# Patient Record
Sex: Female | Born: 1965 | Race: Black or African American | Hispanic: No | Marital: Single | Smoking: Former smoker
Health system: Southern US, Community
[De-identification: ages and names within clinical notes are randomized; demographics above are authoritative.]

## PROBLEM LIST (undated history)

## (undated) DIAGNOSIS — K635 Polyp of colon: Secondary | ICD-10-CM

## (undated) DIAGNOSIS — D126 Benign neoplasm of colon, unspecified: Secondary | ICD-10-CM

## (undated) DIAGNOSIS — K5792 Diverticulitis of intestine, part unspecified, without perforation or abscess without bleeding: Secondary | ICD-10-CM

## (undated) DIAGNOSIS — I493 Ventricular premature depolarization: Secondary | ICD-10-CM

## (undated) DIAGNOSIS — H8110 Benign paroxysmal vertigo, unspecified ear: Secondary | ICD-10-CM

## (undated) DIAGNOSIS — H9209 Otalgia, unspecified ear: Secondary | ICD-10-CM

## (undated) DIAGNOSIS — K648 Other hemorrhoids: Secondary | ICD-10-CM

## (undated) DIAGNOSIS — K219 Gastro-esophageal reflux disease without esophagitis: Secondary | ICD-10-CM

## (undated) DIAGNOSIS — D649 Anemia, unspecified: Secondary | ICD-10-CM

## (undated) DIAGNOSIS — I10 Essential (primary) hypertension: Secondary | ICD-10-CM

## (undated) DIAGNOSIS — K5790 Diverticulosis of intestine, part unspecified, without perforation or abscess without bleeding: Secondary | ICD-10-CM

## (undated) HISTORY — DX: Benign neoplasm of colon, unspecified: D12.6

## (undated) HISTORY — PX: COLONOSCOPY: SHX174

## (undated) HISTORY — PX: CERVICAL BIOPSY  W/ LOOP ELECTRODE EXCISION: SUR135

## (undated) HISTORY — PX: UMBILICAL HERNIA REPAIR: SHX196

## (undated) HISTORY — DX: Ventricular premature depolarization: I49.3

## (undated) HISTORY — DX: Diverticulitis of intestine, part unspecified, without perforation or abscess without bleeding: K57.92

## (undated) HISTORY — DX: Benign paroxysmal vertigo, unspecified ear: H81.10

## (undated) HISTORY — PX: CHOLECYSTECTOMY: SHX55

## (undated) HISTORY — PX: TONSILLECTOMY: SUR1361

## (undated) HISTORY — PX: BREAST BIOPSY: SHX20

## (undated) HISTORY — DX: Essential (primary) hypertension: I10

## (undated) HISTORY — DX: Other hemorrhoids: K64.8

## (undated) HISTORY — DX: Polyp of colon: K63.5

---

## 1997-11-16 ENCOUNTER — Other Ambulatory Visit: Admission: RE | Admit: 1997-11-16 | Discharge: 1997-11-16 | Payer: Self-pay | Admitting: *Deleted

## 2003-08-05 ENCOUNTER — Emergency Department (HOSPITAL_COMMUNITY): Admission: EM | Admit: 2003-08-05 | Discharge: 2003-08-05 | Payer: Self-pay | Admitting: Family Medicine

## 2005-03-29 ENCOUNTER — Emergency Department (HOSPITAL_COMMUNITY): Admission: EM | Admit: 2005-03-29 | Discharge: 2005-03-29 | Payer: Self-pay | Admitting: Emergency Medicine

## 2006-08-09 ENCOUNTER — Emergency Department (HOSPITAL_COMMUNITY): Admission: EM | Admit: 2006-08-09 | Discharge: 2006-08-09 | Payer: Self-pay | Admitting: Family Medicine

## 2007-07-31 ENCOUNTER — Encounter: Admission: RE | Admit: 2007-07-31 | Discharge: 2007-07-31 | Payer: Self-pay | Admitting: Obstetrics & Gynecology

## 2007-07-31 ENCOUNTER — Encounter (INDEPENDENT_AMBULATORY_CARE_PROVIDER_SITE_OTHER): Payer: Self-pay | Admitting: Diagnostic Radiology

## 2008-10-15 ENCOUNTER — Emergency Department (HOSPITAL_COMMUNITY): Admission: EM | Admit: 2008-10-15 | Discharge: 2008-10-15 | Payer: Self-pay | Admitting: Emergency Medicine

## 2009-04-18 ENCOUNTER — Emergency Department (HOSPITAL_COMMUNITY): Admission: EM | Admit: 2009-04-18 | Discharge: 2009-04-18 | Payer: Self-pay | Admitting: Emergency Medicine

## 2010-05-08 ENCOUNTER — Encounter: Admission: RE | Admit: 2010-05-08 | Discharge: 2010-05-08 | Payer: Self-pay | Admitting: Obstetrics and Gynecology

## 2010-07-09 HISTORY — PX: VAGINAL HYSTERECTOMY: SUR661

## 2010-07-24 LAB — BASIC METABOLIC PANEL
BUN: 14 mg/dL (ref 6–23)
CO2: 24 mEq/L (ref 19–32)
Calcium: 9.1 mg/dL (ref 8.4–10.5)
Chloride: 108 mEq/L (ref 96–112)
Creatinine, Ser: 0.64 mg/dL (ref 0.4–1.2)
GFR calc Af Amer: >60 mL/min (ref >60)
GFR calc non Af Amer: >60 mL/min (ref >60)
Glucose, Bld: 73 mg/dL (ref 70–99)
Potassium: 3.8 mEq/L (ref 3.5–5.1)
Sodium: 137 mEq/L (ref 135–145)

## 2010-07-24 LAB — MRSA CULTURE

## 2010-07-24 LAB — CBC
HCT: 29.9 % — ABNORMAL LOW (ref 36.0–46.0)
Hemoglobin: 9.4 g/dL — ABNORMAL LOW (ref 12.0–15.0)
MCH: 25.7 pg — ABNORMAL LOW (ref 26.0–34.0)
MCHC: 31.4 g/dL (ref 30.0–36.0)
MCV: 81.7 fL (ref 78.0–100.0)
Platelets: 349 10*3/uL (ref 150–400)
RBC: 3.66 MIL/uL — ABNORMAL LOW (ref 3.87–5.11)
RDW: 16.5 % — ABNORMAL HIGH (ref 11.5–15.5)
WBC: 7.9 10*3/uL (ref 4.0–10.5)

## 2010-07-24 LAB — SURGICAL PCR SCREEN: Staphylococcus aureus: INVALID — AB

## 2010-07-25 ENCOUNTER — Ambulatory Visit (HOSPITAL_COMMUNITY)
Admission: RE | Admit: 2010-07-25 | Discharge: 2010-07-26 | Payer: Self-pay | Source: Home / Self Care | Attending: Obstetrics and Gynecology | Admitting: Obstetrics and Gynecology

## 2010-07-25 ENCOUNTER — Encounter (INDEPENDENT_AMBULATORY_CARE_PROVIDER_SITE_OTHER): Payer: Self-pay | Admitting: Obstetrics and Gynecology

## 2010-07-26 LAB — PREGNANCY, URINE: Preg Test, Ur: NEGATIVE

## 2010-07-31 LAB — CBC
HCT: 26 % — ABNORMAL LOW (ref 36.0–46.0)
Hemoglobin: 8.3 g/dL — ABNORMAL LOW (ref 12.0–15.0)
MCH: 25.9 pg — ABNORMAL LOW (ref 26.0–34.0)
MCHC: 31.9 g/dL (ref 30.0–36.0)
MCV: 81.3 fL (ref 78.0–100.0)
Platelets: 248 10*3/uL (ref 150–400)
RBC: 3.2 MIL/uL — ABNORMAL LOW (ref 3.87–5.11)
RDW: 15.8 % — ABNORMAL HIGH (ref 11.5–15.5)
WBC: 13.7 10*3/uL — ABNORMAL HIGH (ref 4.0–10.5)

## 2010-07-31 LAB — BASIC METABOLIC PANEL
BUN: 10 mg/dL (ref 6–23)
CO2: 27 mEq/L (ref 19–32)
Calcium: 9.2 mg/dL (ref 8.4–10.5)
Chloride: 106 mEq/L (ref 96–112)
Creatinine, Ser: 0.6 mg/dL (ref 0.4–1.2)
GFR calc Af Amer: >60 mL/min (ref >60)
GFR calc non Af Amer: >60 mL/min (ref >60)
Glucose, Bld: 99 mg/dL (ref 70–99)
Potassium: 3.9 mEq/L (ref 3.5–5.1)
Sodium: 137 mEq/L (ref 135–145)

## 2010-08-24 NOTE — Op Note (Signed)
  [name], [name]             ACCOUNT NO.:  [id number]  MEDICAL RECORD NO.:  [id number]          PATIENT TYPE:  OIB  LOCATION:  9313                          FACILITY:  [facility]  PHYSICIAN:  Sherron Monday, MD        DATE OF BIRTH:  1966-01-09  DATE OF PROCEDURE:  07/25/2010 DATE OF DISCHARGE:                              OPERATIVE REPORT   PREOPERATIVE DIAGNOSES: 1. Menorrhagia. 2. Anemia. 3. Dysmenorrhea.  POSTOPERATIVE DIAGNOSES: 1. Menorrhagia. 2. Anemia. 3. Dysmenorrhea.  PROCEDURES: 1. Total vaginal hysterectomy. 2. Cystoscopy.  SURGEON:  Sherron Monday, MD  ASSISTANT:  Zenaida Niece, MD  ANESTHESIA:  General.  FINDINGS:  A 6-week size uterus, normal bilateral ovaries and bilateral tubes.  SPECIMEN:  Uterus and cervix to Pathology.  ESTIMATED BLOOD LOSS:  100 mL.  IV FLUIDS:  1100 mL.  URINE OUTPUT:  75 mL, clear at the end of the case.  COMPLICATIONS:  None.  DISPOSITION:  Stable to PACU.  DESCRIPTION OF PROCEDURE:  After informed consent was reviewed with the patient including risks, benefits, and alternatives of the surgical procedure, she was transported to the OR, placed on the table in supine position.  General anesthesia was induced and found to be adequate.  She was then placed in the Yellofin stirrups, prepped and draped in the normal sterile fashion.  A Foley was sterilely placed.  Using a heavy weighted speculum and a Sims retractor, her cervix was easily visualized, grasped with 2 Christella Hartigan tenaculae and infiltrated with Pitressin 20-50.  The cervix was then circumscribed using Bovie cautery. Entry was made posteriorly and the entry into the peritoneum was extended.  The banana speculum was then placed.  The uterosacral ligaments were ligated and held in stepwise fashion.  The cardinal ligaments through the uterine artery and were ligated bilaterally. Entry was made anteriorly with good visualization.  The IP ligament was doubly ligated with  0 Vicryl.  The uterus was removed in its entirety. The pedicles were all inspected closely and noted to be hemostatic.  The uterosacral ligaments were plicated with 0 Vicryl.  The pedicles were then tied together.  The cuff was closed with 2-0 Vicryl in a running locked fashion.  The Foley was then removed.  The cystoscope was placed in to the bladder where 200 mL was placed after indigo carmine having being given to the patient.  Jet was easily visualized on the left after waiting.  A jet was visualized on the right as well.  The cystoscope was removed.  Foley was replaced.  Sponge, lap, and needle counts were correct x2 per the operating room staff.  The patient tolerated the procedure well.     Sherron Monday, MD     JB/MEDQ  D:  07/25/2010  T:  07/26/2010  Job:  [id number]  Electronically Signed by Sherron Monday MD on 08/24/2010 05:13:22 PM

## 2010-08-24 NOTE — H&P (Signed)
  [name], [name]             ACCOUNT NO.:  [id number]  MEDICAL RECORD NO.:  [id number]           PATIENT TYPE:  LOCATION:                                 FACILITY:  PHYSICIAN:  Sherron Monday, MD        DATE OF BIRTH:  [birth year]  DATE OF ADMISSION:  07/25/2010 DATE OF DISCHARGE:                             HISTORY & PHYSICAL   ADMITTING DIAGNOSIS:  Menorrhagia.  PROCEDURE PLANNED:  Total vaginal hysterectomy.  HISTORY OF PRESENT ILLNESS:  A 45 year old G3, P2-0-1-2 who presents with menorrhagia and irregular menses for a evaluation.  An endometrial biopsy was benign.  The patient states that she cannot tolerate the heavy bleeding anymore.  She also has a history of an abnormal Pap smear, has had he tubes tied for contraception and is not willing to try hormonal control of her cycle.  I discussed with the patient risks, benefits and alternatives of the surgical procedure.  She voices understanding to all of this and wishes to proceed.  PAST MEDICAL HISTORY:  Not significant.  PAST SURGICAL HISTORY:  Significant for: 1. A tubal ligation. 2. Breast biopsy. 3. D and C. 4. Hernia repair. 5. Tonsillectomy.  PAST OB/GYN HISTORY:  G3 P2-0-1-2.  She has a history of an abnormal Pap smear and I believe they have been normal since. She has had her tubes tied for contraception.  She has complained of 15-60 days cycles for quite some time.  She states they are unmanageable.  MEDICATIONS:  Include multivitamins.  ALLERGIES:  NO KNOWN DRUG ALLERGIES.  SOCIAL HISTORY:  Denies tobacco or drug use.  She is a smoker. Occasional alcohol use.  No other illicit drug use.  She is divorced, has no history of any sexually transmitted diseases.  FAMILY HISTORY:  Significant for breast cancer, diabetes type 2 and hypertension.  PHYSICAL EXAMINATION:  GENERAL:  No apparent distress. CARDIOVASCULAR:  Regular rate and rhythm. LUNGS:  Clear to auscultation bilaterally. ABDOMEN:  Soft,  nontender, nondistended.  Positive bowel sounds. EXTREMITIES:  Symmetric and nontender.  Her hemoglobin, she was mildly anemic at 9.6 the last time it was checked.  She had an ultrasound to evaluate her ovaries more fully, which showed small fibroids.  ASSESSMENT AND PLAN:  A 45 year old G3, P2 0-1-2 for a total vaginal hysterectomy.  After the discussion of risks, benefits and alternatives, she wishes to proceed.  We did discuss bleeding, infection, damage to surrounding organs as well as trouble healing.  She voiced understanding.  Her ovaries will be maintained after a discussion of surgical menopause.  She voices understanding to all this and wishes to proceed.     Sherron Monday, MD     JB/MEDQ  D:  07/24/2010  T:  07/24/2010  Job:  [id number]  Electronically Signed by Sherron Monday MD on 08/24/2010 05:12:56 PM

## 2010-08-24 NOTE — Discharge Summary (Signed)
  [name], [name]             ACCOUNT NO.:  [id number]  MEDICAL RECORD NO.:  [id number]          PATIENT TYPE:  OIB  LOCATION:  9313                          FACILITY:  [facility]  PHYSICIAN:  Sherron Monday, MD        DATE OF BIRTH:  06/18/1966  DATE OF ADMISSION:  07/25/2010 DATE OF DISCHARGE:  07/26/2010                              DISCHARGE SUMMARY   ADMITTING DIAGNOSES:  Menorrhagia to anemia as well as dysmenorrhea  PROCEDURE:  Total vaginal hysterectomy.  HISTORY:  For complete a history, please see the dictated note. However, in brief, a 45 year old G3, P 2-0-1-2 who presents with menorrhagia, dysmenorrhea and irregular bleeding for evaluation and after evaluation decided to proceed with definitive management of the total vaginal hysterectomy.  This was performed on the 17th without complications.  She had this as well as cystoscopy revealing 6-week size uterus, normal ovaries bilaterally and normal tubes.  Her uterus and cervix were sent to Pathology.  EBL was approximately 800 mL.  On postoperative day #1, her pain was well controlled.  She was tolerating diet, voiding and ambulating without difficulty.  She had remained afebrile.  Vital signs stable with great urine output since her procedure.  Hemoglobin decreased from 9.4 to 8.3 and creatinine was stable at 6.46.  She was discharged to home with routine discharge instructions and numbers to call with any questions or problems as well as prescriptions for Motrin and Percocet.  She was given instructions to call and follow up in the office in approximately 2-3 weeks.  She voiced understanding to all of this and was discharged.     Sherron Monday, MD     JB/MEDQ  D:  07/26/2010  T:  07/26/2010  Job:  [id number]  Electronically Signed by Sherron Monday MD on 08/24/2010 05:12:29 PM

## 2010-11-21 ENCOUNTER — Inpatient Hospital Stay (INDEPENDENT_AMBULATORY_CARE_PROVIDER_SITE_OTHER)
Admission: RE | Admit: 2010-11-21 | Discharge: 2010-11-21 | Disposition: A | Discharge status: Home / Self Care | Payer: 59 | Source: Ambulatory Visit | Attending: Family Medicine | Admitting: Family Medicine

## 2010-11-21 DIAGNOSIS — J029 Acute pharyngitis, unspecified: Secondary | ICD-10-CM

## 2010-11-21 LAB — POCT RAPID STREP A: Streptococcus, Group A Screen (Direct): NEGATIVE

## 2011-05-07 DIAGNOSIS — H8110 Benign paroxysmal vertigo, unspecified ear: Secondary | ICD-10-CM

## 2011-05-07 DIAGNOSIS — Z8679 Personal history of other diseases of the circulatory system: Secondary | ICD-10-CM | POA: Insufficient documentation

## 2011-05-07 DIAGNOSIS — I493 Ventricular premature depolarization: Secondary | ICD-10-CM | POA: Insufficient documentation

## 2011-05-07 DIAGNOSIS — I10 Essential (primary) hypertension: Secondary | ICD-10-CM

## 2011-05-07 HISTORY — DX: Ventricular premature depolarization: I49.3

## 2011-05-07 HISTORY — DX: Essential (primary) hypertension: I10

## 2011-05-07 HISTORY — DX: Personal history of other diseases of the circulatory system: Z86.79

## 2011-05-07 HISTORY — DX: Benign paroxysmal vertigo, unspecified ear: H81.10

## 2011-05-08 ENCOUNTER — Emergency Department (HOSPITAL_COMMUNITY): Payer: 59

## 2011-05-08 ENCOUNTER — Encounter: Payer: Self-pay | Admitting: Internal Medicine

## 2011-05-08 ENCOUNTER — Observation Stay (HOSPITAL_COMMUNITY)
Admission: EM | Admit: 2011-05-08 | Discharge: 2011-05-10 | Disposition: A | Discharge status: Home / Self Care | Payer: 59 | Attending: Internal Medicine | Admitting: Internal Medicine

## 2011-05-08 DIAGNOSIS — I10 Essential (primary) hypertension: Secondary | ICD-10-CM | POA: Insufficient documentation

## 2011-05-08 DIAGNOSIS — R002 Palpitations: Secondary | ICD-10-CM

## 2011-05-08 DIAGNOSIS — H8110 Benign paroxysmal vertigo, unspecified ear: Principal | ICD-10-CM | POA: Insufficient documentation

## 2011-05-08 DIAGNOSIS — R42 Dizziness and giddiness: Secondary | ICD-10-CM

## 2011-05-08 DIAGNOSIS — R4701 Aphasia: Secondary | ICD-10-CM | POA: Insufficient documentation

## 2011-05-08 DIAGNOSIS — I519 Heart disease, unspecified: Secondary | ICD-10-CM | POA: Insufficient documentation

## 2011-05-08 LAB — DIFFERENTIAL
Basophils Absolute: 0 10*3/uL (ref 0.0–0.1)
Basophils Relative: 0 % (ref 0–1)
Eosinophils Absolute: 0.1 10*3/uL (ref 0.0–0.7)
Eosinophils Relative: 2 % (ref 0–5)
Lymphocytes Relative: 41 % (ref 12–46)
Lymphs Abs: 3.5 10*3/uL (ref 0.7–4.0)
Monocytes Absolute: 0.5 10*3/uL (ref 0.1–1.0)
Monocytes Relative: 6 % (ref 3–12)
Neutro Abs: 4.5 10*3/uL (ref 1.7–7.7)
Neutrophils Relative %: 52 % (ref 43–77)

## 2011-05-08 LAB — CBC
HCT: 36.3 % (ref 36.0–46.0)
Hemoglobin: 12.2 g/dL (ref 12.0–15.0)
MCH: 31.4 pg (ref 26.0–34.0)
MCHC: 33.6 g/dL (ref 30.0–36.0)
MCV: 93.3 fL (ref 78.0–100.0)
Platelets: 257 10*3/uL (ref 150–400)
RBC: 3.89 MIL/uL (ref 3.87–5.11)
RDW: 12.5 % (ref 11.5–15.5)
WBC: 8.7 10*3/uL (ref 4.0–10.5)

## 2011-05-08 LAB — COMPREHENSIVE METABOLIC PANEL
ALT: 12 U/L (ref 0–35)
AST: 18 U/L (ref 0–37)
Albumin: 3.7 g/dL (ref 3.5–5.2)
Alkaline Phosphatase: 74 U/L (ref 39–117)
BUN: 13 mg/dL (ref 6–23)
CO2: 20 mEq/L (ref 19–32)
Calcium: 10 mg/dL (ref 8.4–10.5)
Chloride: 104 mEq/L (ref 96–112)
Creatinine, Ser: 0.64 mg/dL (ref 0.50–1.10)
GFR calc Af Amer: >90 mL/min (ref >90)
GFR calc non Af Amer: >90 mL/min (ref >90)
Glucose, Bld: 99 mg/dL (ref 70–99)
Potassium: 4 mEq/L (ref 3.5–5.1)
Sodium: 135 mEq/L (ref 135–145)
Total Bilirubin: 0.3 mg/dL (ref 0.3–1.2)
Total Protein: 7.5 g/dL (ref 6.0–8.3)

## 2011-05-08 LAB — HEMOGLOBIN A1C
Hgb A1c MFr Bld: 5.3 % (ref <5.7)
Mean Plasma Glucose: 105 mg/dL (ref <117)

## 2011-05-08 LAB — CK TOTAL AND CKMB (NOT AT ARMC)
CK, MB: 2.2 ng/mL (ref 0.3–4.0)
Relative Index: 2 (ref 0.0–2.5)
Total CK: 111 U/L (ref 7–177)

## 2011-05-08 LAB — TSH: TSH: 0.557 u[IU]/mL (ref 0.350–4.500)

## 2011-05-08 LAB — PROTIME-INR
INR: 0.98 (ref 0.00–1.49)
Prothrombin Time: 13.2 seconds (ref 11.6–15.2)

## 2011-05-08 LAB — TROPONIN I: Troponin I: <0.3 ng/mL (ref <0.30)

## 2011-05-08 LAB — APTT: aPTT: 34 seconds (ref 24–37)

## 2011-05-08 LAB — GLUCOSE, CAPILLARY: Glucose-Capillary: 90 mg/dL (ref 70–99)

## 2011-05-08 NOTE — H&P (Signed)
Hospital Admission Note Date: 05/08/2011  Patient name:  [name]   Medical record number:  [id number] Date of birth:  03-19-1966    Age: 45 y.o. Gender:  female PCP:   No primary provider on file.  Medical Service:  Internal Medicine Teaching Service   Attending physician:  Dr. Cliffton Asters First Contact:   Dr. Manson Passey   Pager: [phone]  Second Contact:  Dr. Saralyn Pilar Pager: [phone] After Hours:   First Contact   Pager: [phone]     Second Contact   Pager: [phone]   Chief Complaint: dizziness  History of Present Illness: Patient is a 45 y.o. female with PMHx of iron-deficiency anemia due to menorrhagia (s/p total hysterectomy with recent normalization of iron levels) , who presents to [facility] for evaluation of acute dizziness that began at 12:00PM on the afternoon of admission. Pt indicates that she was walking back from the restroom at work, when she suddenly felt that the room was shifting laterally around her, causing her balance to be disturbed and requiring the assistance of her coworkers to avoid fall. Pt also was noted to have pressured speech and difficulty with word finding and described associated nausea. Otherwise remained without focal weakness or paresthesias of extremities, facial droop, difficulty swallowing, confusion, vision changes. Symptoms persistent, therefore, EMS was called and pt transferred to [facility] for further evaluation and treatment. Pt indicates that symptoms have been persistent since onset, however are notably improving. Pt does notably describe a recent viral illness with productive cough with clear sputum x 2 weeks, states she has multiple sick contacts at work with bronchitis, allergies. She herself otherwise denies associated sore throat, ear pain, loss of hearing, nasal congestion.  Lastly, pt indicates a similar, but more mild episode approximately one month ago that acutely came on when ambulating and resolved within 24 hours after resting. She further  describes intermittent palpitations for which she required holter monitor testing 10 years ago that was negative.  Current Outpatient Medications:  Iron sulfate   Allergies: NKDA  Past Medical History: Anemia - secondary to menorrhagia, improved s/p TVH  Past Surgical History: 1. Tubal ligation.  2. Breast biopsy.  3. D and C.  4. Hernia repair.  5. Tonsillectomy.  6. Total Vaginal Hysterectomy (07/2010)  Family History: 1. Breast cancer, HTN, DMII - mother 2. Cervical cancer - maternal aunt 3. Diabetes type 2 - maternal family 4. DVT, cerebral anerysm - father    Social History: History   Social History  . Marital Status: Divorced    Number of Children: 2   Occupational History  . 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 Smoking status: Smoking cigarettes/ cigars since age 62yo, approximately 1 pack per week.  . Smokeless tobacco: None  . Alcohol Use: None  . Drug Use: None   Other Topics Concern  . Not on file    Review of Systems: Constitutional:  Denies fever, chills, diaphoresis, appetite change and fatigue.  HEENT: Denies photophobia, eye pain, redness, hearing loss, ear pain, congestion, sore throat, rhinorrhea, sneezing, mouth sores, trouble swallowing, neck pain, neck stiffness and tinnitus.  Respiratory: Denies SOB, DOE, cough, chest tightness, and wheezing.  Cardiovascular: Denies chest pain, palpitations and leg swelling.  Gastrointestinal: Confirms nausea. Denies vomiting, abdominal pain, diarrhea, constipation, blood in stool and abdominal distention.  Genitourinary: Denies dysuria, urgency, frequency, hematuria, flank pain and difficulty urinating.  Musculoskeletal: Denies myalgias, back pain, joint swelling, arthralgias and gait problem.  Skin: Denies pallor, rash and wound.  Neurological: Confirms dizziness, denies seizures, syncope, light-headedness, numbness and headaches.     Vital Signs: T: 99.2 BP:  138/96 P: 73 O2 sat: 98% on RA  RR: 18  Physical Exam: General: Vital signs reviewed and noted. Well-developed, well-nourished, in no acute distress; alert, appropriate and cooperative throughout examination.  Head: Normocephalic, atraumatic.  Eyes: PERRL, EOMI, No signs of anemia or jaundince.  Nose: Mucous membranes moist, not inflammed, nonerythematous.  Throat: Oropharynx nonerythematous, no exudate appreciated.   Neck: No deformities, masses, or tenderness noted.Supple, no JVD.  Lungs:  Normal respiratory effort. Clear to auscultation BL without crackles or wheezes.  Heart: RRR. S1 and S2 normal without gallop, murmur, or rubs.  Abdomen:  BS normoactive. Soft, Nondistended, non-tender.  No masses or organomegaly.  Extremities: No pretibial edema.  Neurologic: A&O X3, speech pressured but not slurred, CN II - XII intact, strength 5/5 throughout except as noted: R hip flexion: 4/5, R knee flexion: 4/5, R knee extension: 3/5, R foot dorsiflexion: 4/5, R foot plantarflexion: 4/5.  Sensation intact to light touch throughout.  Reflexes 2+ throughout.  Left toes downgoing, right toes neutral.  Finger-to-nose, heel-to-shin, and alternating hand movements normal.  Dix-halpike maneuver reproduced symptoms, but did not produce nystagmus.  Skin: No visible rashes, scars.   Lab results: Basic Metabolic Panel: Recent Labs  [facility] 05/08/11 1238   NA 135   K 4.0   CL 104   CO2 20   GLUCOSE 99   BUN 13   CREATININE 0.64   CALCIUM 10.0   MG --   PHOS --   Liver Function Tests: Recent Labs  Basename 05/08/11 1238   AST 18   ALT 12   ALKPHOS 74   BILITOT 0.3   PROT 7.5   ALBUMIN 3.7    CBC: Recent Labs  Basename 05/08/11 1238   WBC 8.7   NEUTROABS 4.5   HGB 12.2   HCT 36.3   MCV 93.3   PLT 257   Cardiac Enzymes: Recent Labs  Basename 05/08/11 1238   CKTOTAL 111   CKMB 2.2   CKMBINDEX --   TROPONINI <0.30   CBG: Recent Labs  Basename 05/08/11 1235   GLUCAP 90     Imaging results:  1. Ct Head Wo Contrast (05/08/2011): Negative exam.   2. Mr Angiogram Head Wo Contrast (05/08/2011): Negative intracranial MRA.   3. Mr Brain Wo Contrast (05/08/2011): Normal noncontrast MRI appearance of the brain.    Assessment & Plan: Pt is a 45yo female with PMHx of iron-deficiency anemia who presented to [facility] for evaluation of acute dizziness that began at 12:00PM on the afternoon of admission, notably 2 weeks after viral URI, with CT head negative for bleed and normal MRI/MRA brain, with subjective dizziness with Dix-Hallpike maneuver.  1. Dizziness: The patient's dizziness likely represents vestibular neuritis, given the onset of dizziness and ataxia about 2 weeks after a suspected viral URI. Symptoms may also represent BPPV, given dizziness, a history of similar symptoms 1 month ago, and reproduction of symptoms with dix-halpike maneuver (though frank nystagmus was not observed). Symptoms were initially concerning for CVA, but CT and MRI/MRA showed no acute intracranial process, and symptoms steadily improved over the course of hours after onset. Additionally, intracranial mass, abscess, and Symptoms are unlikely due to TIA, given age and lack of risk factors (only smoking). Of note, patient does report occasional daily palpitations, with a reported negative 24-hr holter monitor 10 years  ago, but no palpitations on the day of admission.   admit to observation with telemetry  PT/OT, with assessment for BPPV and eligibility for vestibular rehab  UDS, TSH, Hb A1c  if symptoms continue to improve, will likely d/c in a.m.  2.    Palpitations - etiology unclear, likely PVC's. Patient currently in sinus rhythm. Reportedly negative holter monitor 10 years ago.  telemetry overnight  EKG  may consider outpatient repeat holter monitoring  3.    DVT PPX - Lovenox   Janalyn Harder, M.D. (PGY1):   ____________________________________   Date/ Time:      ____________________________________     Johnette Abraham, D.O. (PGY2):  ____________________________________   Date/ Time:     ____________________________________      I have seen and examined the patient. I reviewed the resident/fellow note and agree with the findings and plan of care as documented. My additions and revisions are included.    Signature:    _________________________________________       Internal Medicine Teaching Service Attending    Date:      _________________________________________        Note was left in Dr Saralyn Pilar' inbasket after her last day. I am signing the note to complete the process but was not involved in the pat's care.

## 2011-05-09 ENCOUNTER — Observation Stay (HOSPITAL_COMMUNITY): Payer: 59

## 2011-05-09 DIAGNOSIS — R002 Palpitations: Secondary | ICD-10-CM

## 2011-05-09 DIAGNOSIS — R42 Dizziness and giddiness: Secondary | ICD-10-CM

## 2011-05-09 DIAGNOSIS — R569 Unspecified convulsions: Secondary | ICD-10-CM

## 2011-05-09 DIAGNOSIS — I059 Rheumatic mitral valve disease, unspecified: Secondary | ICD-10-CM

## 2011-05-09 LAB — RAPID URINE DRUG SCREEN, HOSP PERFORMED
Amphetamines: NOT DETECTED
Barbiturates: NOT DETECTED
Benzodiazepines: NOT DETECTED
Cocaine: NOT DETECTED
Opiates: NOT DETECTED
Tetrahydrocannabinol: NOT DETECTED

## 2011-05-09 LAB — LIPID PANEL
Cholesterol: 170 mg/dL (ref 0–200)
HDL: 37 mg/dL — ABNORMAL LOW
LDL Cholesterol: 105 mg/dL — ABNORMAL HIGH (ref 0–99)
Total CHOL/HDL Ratio: 4.6 ratio
Triglycerides: 138 mg/dL
VLDL: 28 mg/dL (ref 0–40)

## 2011-05-09 MED ORDER — GADOBENATE DIMEGLUMINE 529 MG/ML IV SOLN
15.0000 mL | Freq: Once | INTRAVENOUS | Status: AC
  Administered 2011-05-09: 15 mL via INTRAVENOUS

## 2011-05-09 NOTE — Consult Note (Signed)
[name], GIN NO.:  [id number]  MEDICAL RECORD NO.:  [id number]  LOCATION:  5531                         FACILITY:  [facility]  PHYSICIAN:  Carmell Austria, MD        DATE OF BIRTH:  06-26-1966  DATE OF CONSULTATION: DATE OF DISCHARGE:                                CONSULTATION   CHIEF COMPLAINT:  "Aphasia and slurred speech."  HISTORY:  A 45 year old woman who had an episode of dizziness while at work.  Subsequently, she developed aphasia and slurred speech, was brought in by EMS in the stroke code.  However, no TPA was given.  A NIH stroke scale was 1.  The patient has a history of similar event in the past where she was at a friend's house and she also developed dizziness followed by some slurred speech.  However, the patient got into her car, drove back home, went to sleep and woke up in the morning with no symptoms.  The only problem and finding on patient's exam was slurred speech.  PAST MEDICAL HISTORY:  Hysterectomy for menorrhagia, anemia, D and C, breast biopsy, tubal ligation, hernia repair, and tonsillectomy.  MEDICATIONS:  None.  ALLERGIES:  No known drug allergies.  FAMILY HISTORY:  Non-contributory.  SOCIAL HISTORY:  No alcohol or drugs.  She smokes one pack of cigarettes per day for 30 years.  REVIEW OF SYSTEMS:  Otherwise is unremarkable.  PHYSICAL EXAMINATION:  VITAL SIGNS:  Temperature was 98.3, blood pressure 120/71, heart rate 73, respirations 20, O2 sats 96. NEUROLOGIC:  Her NIH stroke scale was 1 as I mentioned.  She was slow to answer questions but did not show evidence of aphasia.  She was able to follow complex commands.  Her extraocular movements were intact.  Pupils equal, round, reactive to light and accommodation.  Otherwise, no facial asymmetry.  Tongue was midline.  V1 through V3 sensation was intact. There was no pronator drift.  She 5/5 in all extremities.  There were no deficits to light touch, pinprick throughout.   There was no dysmetria on finger-to-nose bilaterally.  Reflexes were 2+ throughout with downgoing plantars.  Her gait could not be assessed that she was being evaluated for stroke.  LABORATORY DATA:  BMP:  Sodium 135, potassium 4, chloride 104, bicarb 20, BUN 13, creatinine 0.64, glucose 99.  CBC; white count 8.7, H and H 12.2 and 36.3, platelets 257.  Coags PT 37.2, INR 0.98, PTT 34.  CT head showed no acute stroke.  IMPRESSION:  A 45 year old woman who had a second life-time episode of dizziness followed by slurred speech and some expressive aphasia.  Most of the symptoms except some slurred speech have resolved by the time she was in CAT scan.  She was not a TPA candidate due to low NIH stroke scale.  I will obtain an MRI and MRA brain to rule out stroke and as of time  of the dictation, the MRI and MRA brain has been done already and it is negative.  I also recommend that patient be admitted for syncopal workup.  She did say that she was vertiginous both times.  I will follow the patient.  ______________________________ Carmell Austria, MD  DB/MEDQ  D:  05/08/2011  T:  05/08/2011  Job:  [id number]  Electronically Signed by Carmell Austria MD on 05/09/2011 05:58:57 PM

## 2011-05-09 NOTE — Procedures (Signed)
EEG number [id number].  This routine EEG was requested in this 45 year old woman who was admitted a day earlier with pressured speech, expressive aphasia, preceded by vertigo.  The purpose of the EEG is to evaluate whether there are interictal epileptiform discharges may suggest this event was a seizure.  The EEG was done with the patient awake, drowsy, and asleep.  During periods of maximal wakefulness she had a moderately regulated, moderately sustained 9 cycle per second posterior dominant rhythm that attenuated with eye opening and was symmetric.  Background activities were composed of low amplitude beta activities that were symmetric.  Photic stimulation did not produce a driving response.  Hyperventilation for 3 minutes with good effort did not markedly change the tracing.  The patient was drowsy and asleep throughout most of the study.  This was characterized by the emergence of slower activities in the theta and delta range that were symmetric.  Symmetric vertex sharp waves and sleep spindles were also noted.  A short run of left  rhythmic midtemporal theta of drowsiness(RMTD) was also noted.  CLINICAL INTERPRETATION:  This routine EEG done with the patient awake, drowsy, and sleep is normal.          ______________________________ Denton Meek, MD    RU:EAVW D:  05/09/2011 15:39:40  T:  05/09/2011 15:55:20  Job #:  [id number]

## 2011-05-10 DIAGNOSIS — R002 Palpitations: Secondary | ICD-10-CM

## 2011-05-10 DIAGNOSIS — R42 Dizziness and giddiness: Secondary | ICD-10-CM

## 2011-05-17 ENCOUNTER — Other Ambulatory Visit: Payer: Self-pay | Admitting: Internal Medicine

## 2011-05-17 ENCOUNTER — Encounter: Payer: Self-pay | Admitting: Internal Medicine

## 2011-05-17 ENCOUNTER — Telehealth: Payer: Self-pay | Admitting: *Deleted

## 2011-05-17 DIAGNOSIS — I10 Essential (primary) hypertension: Secondary | ICD-10-CM

## 2011-05-17 DIAGNOSIS — I493 Ventricular premature depolarization: Secondary | ICD-10-CM

## 2011-05-17 DIAGNOSIS — H8110 Benign paroxysmal vertigo, unspecified ear: Secondary | ICD-10-CM

## 2011-05-17 MED ORDER — HYDROCHLOROTHIAZIDE 12.5 MG PO CAPS: 12.5000 mg | ORAL_CAPSULE | Freq: Every day | ORAL | Status: DC

## 2011-05-17 NOTE — Telephone Encounter (Signed)
Thanks for this note.  I called the patient, instructed her to discontinue lisinopril, and start HCTZ for her hypertension, with grade 2 diastolic dysfunction.  Prescription was electronically sent to her pharmacy.

## 2011-05-17 NOTE — Telephone Encounter (Signed)
Pt called clinic and states she was put on lisinopril while in the hospital.  She has developed a cough, especially at night.   Will you change her to something else for her BP? Pt # [id number]

## 2011-05-24 ENCOUNTER — Ambulatory Visit (INDEPENDENT_AMBULATORY_CARE_PROVIDER_SITE_OTHER): Payer: 59 | Admitting: Ophthalmology

## 2011-05-24 ENCOUNTER — Encounter: Payer: Self-pay | Admitting: Ophthalmology

## 2011-05-24 ENCOUNTER — Other Ambulatory Visit: Payer: Self-pay | Admitting: Obstetrics and Gynecology

## 2011-05-24 VITALS — BP 115/82 | HR 70 | Temp 98.1°F | Ht 67.0 in | Wt 193.0 lb

## 2011-05-24 DIAGNOSIS — R4701 Aphasia: Secondary | ICD-10-CM | POA: Insufficient documentation

## 2011-05-24 DIAGNOSIS — K219 Gastro-esophageal reflux disease without esophagitis: Secondary | ICD-10-CM | POA: Insufficient documentation

## 2011-05-24 DIAGNOSIS — Z1231 Encounter for screening mammogram for malignant neoplasm of breast: Secondary | ICD-10-CM

## 2011-05-24 DIAGNOSIS — I10 Essential (primary) hypertension: Secondary | ICD-10-CM

## 2011-05-24 LAB — BASIC METABOLIC PANEL: CO2: 24 mEq/L (ref 19–32)

## 2011-05-24 NOTE — Progress Notes (Signed)
Subjective:   [name] female   DOB: 02/14/1966 45 y.o.   MRN: [id number]  HPI: [name] is a 45 y.o. hospital follow up for stroke-like episode w/ no evidence of stroke on testing. No ringing in ear, no feeling like water in ear. Initially saw a tilt in her visual field, she was walking towards the right. When she sat down, starting having spinning of the room. Has prescription to vestibular rehabilitation. Will make appt. Has some symptoms initially when she first stands.  Occasionally has sensation that she is seeing a rotating cog on a wheel, ocurs especially when she is having a headache.  Is sensitive to light and sound when she is having a headache. No nausea, vomiting when. Only once a month. Can go to sleep and no need for pain meds.  Cough: Has cough, sensation of lump in the throat. Was on lisinopril, was stopped in the hospital. She does have sensation of heat in her chest after eating a tomato based sauce and sour taste in your mouth. Has symptoms during the day if she drinks too much coffee, or eats too spicy food. Doesn't take anything for symptoms.   HTN: HCTZ is causing her to pee very often and drink a lot of water. Will get CMET. Has home blood pressure monitor. Concern for ability to exercise given finding of heart strain on EKG. Has treadmill at home, wants to lose some weight. Was working out regularly before surgery in January. Patient wants to stop blood pressure pill and see if she needs it, plan at discharge was to take it for one month per patient. Patient will call in a week with no HCTZ with a log of her pressures.  Past Medical History  Diagnosis Date  . BPPV (benign paroxysmal positional vertigo) 05/07/11    Described as room spinning, notes 2 episodes prior to Oct 2012.  Plans to undergo vestibular rehab.  . Hypertension 05/07/11    with grade 2 diastolic dysfunction  . PVC (premature ventricular contraction) 05/07/11    incidentally  seen on telemetry   Current Outpatient Prescriptions  Medication Sig Dispense Refill  . hydrochlorothiazide (MICROZIDE) 12.5 MG capsule Take 1 capsule (12.5 mg total) by mouth daily.  30 capsule  6  . ibuprofen (ADVIL,MOTRIN) 800 MG tablet Take 800 mg by mouth every 8 (eight) hours as needed.        . Multiple Vitamin (MULTIVITAMIN) tablet Take 1 tablet by mouth daily.         No family history on file. History   Social History  . Marital Status: Single    Spouse Name: N/A    Number of Children: N/A  . Years of Education: N/A   Social History Main Topics  . Smoking status: Current Some Day Smoker  . Smokeless tobacco: Not on file  . Alcohol Use: Not on file  . Drug Use: Not on file  . Sexually Active: Not on file   Other Topics Concern  . Not on file   Social History Narrative  . No narrative on file   Review of Systems: Constitutional: Denies fever, chills, diaphoresis, appetite change and fatigue.  HEENT: Denies photophobia, eye pain, redness, hearing loss, ear pain, congestion, sore throat, rhinorrhea, sneezing, mouth sores, trouble swallowing, neck pain, neck stiffness and tinnitus.   Respiratory: Denies SOB, DOE, cough, chest tightness,  and wheezing.   Cardiovascular: Denies chest pain, palpitations and leg swelling.  Gastrointestinal: Denies nausea, vomiting, abdominal  pain, diarrhea, constipation, blood in stool and abdominal distention.  Genitourinary: Denies dysuria, urgency, frequency, hematuria, flank pain and difficulty urinating.  Musculoskeletal: Denies myalgias, back pain, joint swelling, arthralgias and gait problem.  Skin: Denies pallor, rash and wound.  Neurological: Denies dizziness, seizures, syncope, weakness, light-headedness, numbness and headaches.  Hematological: Denies adenopathy. Easy bruising, personal or family bleeding history  Psychiatric/Behavioral: Denies suicidal ideation, mood changes, confusion, nervousness, sleep disturbance and  agitation  Objective:  Physical Exam: Filed Vitals:   05/24/11 0954  BP: 115/82  Pulse: 70  Temp: 98.1 F (36.7 C)  TempSrc: Oral  Height: 5\' 7"  (1.702 m)  Weight: 193 lb (87.544 kg)   Constitutional: Vital signs reviewed.  Patient is a well-developed and well-nourished woman in no acute distress and cooperative with exam. Eyes: PERRL, EOMI, conjunctivae normal, No scleral icterus.  Cardiovascular: RRR, S1 normal, S2 normal, no MRG, pulses symmetric and intact bilaterally Pulmonary/Chest: CTAB, no wheezes, rales, or rhonchi Abdominal: Soft. Non-tender, non-distended, bowel sounds are normal, no masses, organomegaly, or guarding present.  Extremities: no pedal edema bilaterally Neurological: A&Ox 3, cranial nerve II-XII are grossly intact.  Skin: Warm, dry and intact. No rash, cyanosis, or clubbing.  Psychiatric: Normal mood and affect. speech and behavior is normal. Judgment and thought content normal. Cognition and memory are normal.   Assessment & Plan:

## 2011-05-24 NOTE — Patient Instructions (Signed)
Good luck with exercise program. Take tums when you are having acid symptoms.

## 2011-05-24 NOTE — Assessment & Plan Note (Signed)
Patient is averse to being on HTN meds long term and is determined to lose weight and get of the HCTZ. Her pressures were normal while inpatient so I have allowed her a 1-week trial of no HCTZ to see if she is still hypertensive. She will call with results on 11/26. Is tolerating HCTZ well.

## 2011-05-24 NOTE — Assessment & Plan Note (Signed)
Patient seems to have migrainous type symptoms and may have had a migraine without headache as the cause of her stroke-like episode. She was also found to have reproduction of her symptoms with Dix-Hallpike maneuver as an inpatient so BPPV is another possibility. She will follow up with vestibular rehabilitation. No need for migraine prophylaxis since headaches are not severe.

## 2011-05-24 NOTE — Assessment & Plan Note (Signed)
Advised patient that her cough is likely due to GERD and she can control it with Tums whenever she is having symptoms since she does not want to take a daily medication. Will follow up to see if her sensation of globus improves with treatment of GERD. Had MRI of head with no abnormalities noted so lessens concern for mass.

## 2011-06-04 ENCOUNTER — Telehealth: Payer: Self-pay | Admitting: Ophthalmology

## 2011-06-04 NOTE — Progress Notes (Signed)
agree with plans and notes 

## 2011-06-04 NOTE — Telephone Encounter (Signed)
Spoke to patient about her blood pressure that she has been monitoring at home. She had been holding her HCTZ and her blood pressures were normal and mildly elevated in a fluctuating pattern. She reports that she has had consistently high pressure for the past 3 days and she has also had a headache that is persisting. Normally her headaches go away with sleep. I advised her to restart the HCTZ and continue to work on losing weight. Her goal is to lose enough weight that she will not require medication. It is unclear if pain is causing hypertension or if her hypertension is causing her headache.

## 2011-06-15 ENCOUNTER — Ambulatory Visit
Admission: RE | Admit: 2011-06-15 | Discharge: 2011-06-15 | Disposition: A | Discharge status: Home / Self Care | Payer: 59 | Source: Ambulatory Visit | Attending: Obstetrics and Gynecology | Admitting: Obstetrics and Gynecology

## 2011-06-15 DIAGNOSIS — Z1231 Encounter for screening mammogram for malignant neoplasm of breast: Secondary | ICD-10-CM

## 2011-06-19 ENCOUNTER — Other Ambulatory Visit: Payer: Self-pay | Admitting: Obstetrics and Gynecology

## 2011-06-19 DIAGNOSIS — R928 Other abnormal and inconclusive findings on diagnostic imaging of breast: Secondary | ICD-10-CM

## 2011-07-04 ENCOUNTER — Ambulatory Visit
Admission: RE | Admit: 2011-07-04 | Discharge: 2011-07-04 | Disposition: A | Discharge status: Home / Self Care | Payer: 59 | Source: Ambulatory Visit | Attending: Obstetrics and Gynecology | Admitting: Obstetrics and Gynecology

## 2011-07-04 DIAGNOSIS — R928 Other abnormal and inconclusive findings on diagnostic imaging of breast: Secondary | ICD-10-CM

## 2011-08-09 NOTE — Discharge Summary (Signed)
[name], [name]             ACCOUNT NO.:  [id number]  MEDICAL RECORD NO.:  [id number]  LOCATION:  5531                         FACILITY:  [facility]  PHYSICIAN:  [name], Susan BarnesDATE OF BIRTH:  1966/04/27  DATE OF ADMISSION:  05/08/2011 DATE OF DISCHARGE:  05/10/2011                              DISCHARGE SUMMARY   ATTENDING PHYSICIAN:  Dr. Johny Sax.  DISCHARGE DIAGNOSES: 1. Benign positional vertigo, the patient to undergo outpatient     vestibular rehab. 2. Palpitations, with premature ventricular contractions seen on     telemetry. 3. Expressive aphasia, transient, resolved, likely secondary to     stress. 4. Hypertension, with grade 2 diastolic dysfunction seen on echo,     started on lisinopril.  DISCHARGE MEDICATIONS: 1. Lisinopril 10 mg by mouth daily. 2. Ibuprofen 800 mg 1 tablet by mouth daily as needed. 3. Multivitamin 1 tablet by mouth daily.  DISPOSITION AND FOLLOWUP:  The patient was discharged from [facility] in stable and improved condition with significant improvement in dizziness and resolution of expressive aphasia.  The patient will follow up at the [facility] on November 15 at 9:30 a.m. with Dr. Maisie Fus at which point she will check a BMET in the setting of a newly started medication lisinopril and to ensure continued resolution of patient's dizziness due to BPPV.  PROCEDURES PERFORMED: 1. CT head May 08, 2011, negative exam. 2. MRI/MRA head May 08, 2011, normal MRI/MRA. 3. MRA neck May 09, 2011.  No vertebrobasilar insufficiency.     Normal MRA neck. 4. Echocardiogram with bubbles May 09, 2011, ejection fraction 55-     60%, grade 2 diastolic dysfunction, no PFO. 5. EEG May 09, 2011, normal brain activity.  CONSULTATIONS:  Neurology.  ADMITTING HISTORY AND PHYSICAL:  The patient is a 46 year old woman with a history of iron-deficiency anemia due to menorrhagia status post  total hysterectomy who presents to [facility] for evaluation of acute dizziness.  The patient reports that on noon on the day of admission, the patient experienced sudden onset of dizziness, feeling like the floor was tilted and subsequently that the room was spinning.  The patient was walking back to her desk in the bathroom when this happened, and had to be helped to the desk by her coworkers to keep from falling. After the patient was seated, it was noted that the patient had some difficulty remembering common things such as her social security number or her daughter's phone number.  The patient later described that she felt as if she knew the words she wanted to say but could not form them with her mouth.  The patient notes no focal weakness or paresthesias of the extremities.  No facial droop, difficulty swallowing, confusion or vision changes.  Symptoms persisted, so EMS was called and the patient was transferred to the emergency department.  The patient was seen about 2-1/2 hours after the onset of symptoms and by that point, all of her symptoms had greatly improved, though they were still notably present.  The patient does notably describe a recent viral illness with productive cough for the last 2 weeks with multiple sick contacts at work with bronchitis.  She otherwise denies associated sore throat, ear pain, loss of hearing or nasal congestion.  The patient had a similar but more mild episode of dizziness 1 month ago that acutely came on when ambulating and resolved within 24 hours after resting.  She further describes intermittent palpitations for many years for which she underwent Holter monitor testing 10 years ago which was completely normal.  PHYSICAL EXAMINATION:  VITAL SIGNS:  Temperature 99.2, blood pressure 138/96, pulse 73, respirations 18, O2 saturation 98% on room air. GENERAL:  Alert, cooperative, and in no acute distress. HEENT:  Pupils are equal, round, and  reactive to light.  Extraocular movements intact.  Face symmetrical.  Oropharynx clear and nonerythematous. PULMONARY:  Clear to auscultation bilaterally.  No wheezes, rales, or rhonchi.  CARDIOVASCULAR:  Regular rate and rhythm.  No murmurs, gallops, or rubs.  ABDOMEN:  Bowel sounds normoactive, soft, nondistended, nontender. EXTREMITIES:  No cyanosis, clubbing, or edema. NEUROLOGIC:  Alert and oriented x3.  The patient has difficulty finding some words, but does not slur her speech.  Cranial nerves II through XII intact.  Strength 5/5 throughout except as noted.  Right hip flexion 4/5.  Right knee flexion 4/5.  Right knee extension 3/5.  Right foot dorsiflexion 4/5, plantarflexion 4/5.  Sensation intact to light touch throughout.  Reflexes 2+ throughout.  Left toes downgoing.  Right toes neutral.  Finger-to-nose, heel-to-shin and alternating hand movements normal.  Dix-Hallpike maneuver reproduced symptoms of dizziness, but did not produce nystagmus.  ADMITTING LABORATORY FINDINGS:  Sodium 135, potassium 4.0, chloride 104, bicarb 20, BUN 13, creatinine 0.64, glucose 99.  WBC 8.7, hemoglobin 12.2, hematocrit 36.3, platelets 257.  HOSPITAL COURSE:   1. Dizziness.  The patient presents with acute onset of dizziness described as the room spinning, which subsequently resolved over the preceding few hours and had significantly improved by 12 hours after admission and was only mildly present at hospital discharge. Findings were initially concerning for a stroke which was ruled out by CT and later MRI, MRA.  The patient's symptoms were initially thought to be possibly due to vestibular neuronitis given the preceding history of a viral illness and quick resolution.  Physical therapy evaluation was obtained out of concern for benign paroxysmal vertigo as well, given the reproduction of symptoms with the Dix-Hallpike maneuver and per physical therapy evaluation, the patient was found to be  a good candidate for outpatient vestibular rehab for BPPV.  The patient's history of 1 prior episode of dizziness that also resolved spontaneously over a short period of time is also suggestive of the diagnosis of benign paroxysmal vertigo.  The patient was discharged with only mild residual dizziness and will undergo outpatient vestibular rehab for BPPV. 2. Expressive aphasia.  The patient presented to the hospital with     some difficulty finding her words, described by the patient as     knowing what words she want to stay in her mind but being unable to     form the words.  These symptoms were initially concerning for a     stroke which was ruled out with CT and MRI/MRA.  The patient was     further worked up for this issue with an EEG to check for frontal     lobe seizure.  An MRA was checked for vertebrobasilar     insufficiency, and an echocardiogram with bubbles to assess for PFO     that may be responsible for small systemic clots entering the  cerebral circulation.  All of these tests were normal in this     patient.  The patient's symptoms of expressive aphasia resolved     within the 1st few hours after onset of symptoms and are thought     likely to be simply stress response to the emotionally disturbing  symptoms that the patient had experienced and the rapid sequence of     events including EMS transportation and emergency department scans     and workup.  Conversion disorder is unlikely given the patient's     lack of other life stressors or other coexisting depression,     anxiety or other psychiatric issues. 3. Palpitations.  The patient reports a longstanding history of     palpitations, none of which produce symptoms of lightheadedness,     dizziness, or presyncope.  The patient was admitted on telemetry     and observed for 48 hours.  Telemetry showed occasional PVCs, but     the patient was in normal sinus rhythm throughout hospitalization.     The patient can  followup this issue further in the outpatient     setting.  DISCHARGE VITALS:  Temperature 98.4, blood pressure 130/83, pulse 60, respirations 20, O2 saturation 99% on room air.  Orthostatics vitals showed no orthostatic hypotension.  DISCHARGE LABORATORY FINDINGS:  Hemoglobin A1c 5.3, LDL 105, HDL 37, triglycerides 138, TSH 0.557.    ______________________________ Susan Harder, [name]   ______________________________ [name], M.D.    RB/MEDQ  D:  05/10/2011  T:  05/11/2011  Job:  [id number]  Electronically Signed by Susan Harder [name] on 05/16/2011 12:25:10 PM Electronically Signed by Susan Sax M.D. on 08/09/2011 03:31:15 PM

## 2011-08-24 ENCOUNTER — Encounter: Payer: Self-pay | Admitting: Ophthalmology

## 2011-08-24 ENCOUNTER — Inpatient Hospital Stay (HOSPITAL_COMMUNITY)
Admission: AD | Admit: 2011-08-24 | Discharge: 2011-08-29 | DRG: 392 | Disposition: A | Discharge status: Home / Self Care | Payer: 59 | Source: Ambulatory Visit | Attending: Internal Medicine | Admitting: Internal Medicine

## 2011-08-24 ENCOUNTER — Observation Stay (HOSPITAL_COMMUNITY): Payer: 59

## 2011-08-24 ENCOUNTER — Ambulatory Visit (INDEPENDENT_AMBULATORY_CARE_PROVIDER_SITE_OTHER): Payer: 59 | Admitting: Ophthalmology

## 2011-08-24 DIAGNOSIS — I10 Essential (primary) hypertension: Secondary | ICD-10-CM

## 2011-08-24 DIAGNOSIS — K5732 Diverticulitis of large intestine without perforation or abscess without bleeding: Secondary | ICD-10-CM

## 2011-08-24 DIAGNOSIS — D72829 Elevated white blood cell count, unspecified: Secondary | ICD-10-CM | POA: Diagnosis present

## 2011-08-24 DIAGNOSIS — F17200 Nicotine dependence, unspecified, uncomplicated: Secondary | ICD-10-CM | POA: Diagnosis present

## 2011-08-24 DIAGNOSIS — Z8679 Personal history of other diseases of the circulatory system: Secondary | ICD-10-CM | POA: Diagnosis present

## 2011-08-24 DIAGNOSIS — K5900 Constipation, unspecified: Secondary | ICD-10-CM | POA: Diagnosis present

## 2011-08-24 DIAGNOSIS — R1031 Right lower quadrant pain: Secondary | ICD-10-CM

## 2011-08-24 DIAGNOSIS — E876 Hypokalemia: Secondary | ICD-10-CM | POA: Diagnosis present

## 2011-08-24 DIAGNOSIS — D649 Anemia, unspecified: Secondary | ICD-10-CM | POA: Diagnosis not present

## 2011-08-24 DIAGNOSIS — Z79899 Other long term (current) drug therapy: Secondary | ICD-10-CM

## 2011-08-24 DIAGNOSIS — E669 Obesity, unspecified: Secondary | ICD-10-CM | POA: Diagnosis present

## 2011-08-24 DIAGNOSIS — R509 Fever, unspecified: Secondary | ICD-10-CM

## 2011-08-24 HISTORY — DX: Gastro-esophageal reflux disease without esophagitis: K21.9

## 2011-08-24 LAB — CBC
Platelets: 221 10*3/uL (ref 150–400)
RBC: 4.07 MIL/uL (ref 3.87–5.11)
RDW: 12.4 % (ref 11.5–15.5)
WBC: 20.8 10*3/uL — ABNORMAL HIGH (ref 4.0–10.5)

## 2011-08-24 LAB — DIFFERENTIAL
Basophils Absolute: 0 10*3/uL (ref 0.0–0.1)
Lymphocytes Relative: 17 % (ref 12–46)
Lymphs Abs: 3.5 10*3/uL (ref 0.7–4.0)
Neutrophils Relative %: 77 % (ref 43–77)

## 2011-08-24 LAB — COMPREHENSIVE METABOLIC PANEL
ALT: 16 U/L (ref 0–35)
AST: 21 U/L (ref 0–37)
Alkaline Phosphatase: 104 U/L (ref 39–117)
CO2: 26 mEq/L (ref 19–32)
GFR calc Af Amer: 78 mL/min — ABNORMAL LOW (ref >90)
GFR calc non Af Amer: 67 mL/min — ABNORMAL LOW (ref >90)
Glucose, Bld: 105 mg/dL — ABNORMAL HIGH (ref 70–99)
Potassium: 3.3 mEq/L — ABNORMAL LOW (ref 3.5–5.1)
Sodium: 137 mEq/L (ref 135–145)
Total Protein: 8.1 g/dL (ref 6.0–8.3)

## 2011-08-24 LAB — URINALYSIS, ROUTINE W REFLEX MICROSCOPIC
Glucose, UA: NEGATIVE mg/dL
Hgb urine dipstick: NEGATIVE
Leukocytes, UA: NEGATIVE
Specific Gravity, Urine: 1.006 (ref 1.005–1.030)
pH: 6 (ref 5.0–8.0)

## 2011-08-24 MED ORDER — ONDANSETRON HCL 4 MG/2ML IJ SOLN: 4.0000 mg | Freq: Four times a day (QID) | INTRAMUSCULAR | Status: DC | PRN

## 2011-08-24 MED ORDER — DEXTROSE 5 % IV SOLN
1.0000 g | INTRAVENOUS | Status: DC
  Administered 2011-08-24 – 2011-08-27 (×4): 1 g via INTRAVENOUS
  Filled 2011-08-24 (×5): qty 10

## 2011-08-24 MED ORDER — METRONIDAZOLE IN NACL 5-0.79 MG/ML-% IV SOLN
500.0000 mg | Freq: Three times a day (TID) | INTRAVENOUS | Status: DC
  Administered 2011-08-24 – 2011-08-28 (×11): 500 mg via INTRAVENOUS
  Filled 2011-08-24 (×13): qty 100

## 2011-08-24 MED ORDER — ENOXAPARIN SODIUM 40 MG/0.4ML ~~LOC~~ SOLN
40.0000 mg | SUBCUTANEOUS | Status: DC
  Filled 2011-08-24: qty 0.4

## 2011-08-24 MED ORDER — ACETAMINOPHEN 325 MG PO TABS
500.0000 mg | ORAL_TABLET | Freq: Once | ORAL | Status: AC
  Administered 2011-08-24: 487.5 mg via ORAL

## 2011-08-24 MED ORDER — IOHEXOL 300 MG/ML  SOLN
100.0000 mL | Freq: Once | INTRAMUSCULAR | Status: AC | PRN
  Administered 2011-08-24: 100 mL via INTRAVENOUS

## 2011-08-24 MED ORDER — DOCUSATE SODIUM 100 MG PO CAPS
100.0000 mg | ORAL_CAPSULE | Freq: Two times a day (BID) | ORAL | Status: DC | PRN
  Filled 2011-08-24: qty 1

## 2011-08-24 MED ORDER — ACETAMINOPHEN 650 MG RE SUPP: 650.0000 mg | Freq: Four times a day (QID) | RECTAL | Status: DC | PRN

## 2011-08-24 MED ORDER — HYDROCODONE-ACETAMINOPHEN 5-325 MG PO TABS: 1.0000 | ORAL_TABLET | ORAL | Status: DC | PRN

## 2011-08-24 MED ORDER — IOHEXOL 300 MG/ML  SOLN
20.0000 mL | INTRAMUSCULAR | Status: AC
  Administered 2011-08-24: 20 mL via ORAL

## 2011-08-24 MED ORDER — MORPHINE SULFATE 2 MG/ML IJ SOLN: 2.0000 mg | INTRAMUSCULAR | Status: DC | PRN

## 2011-08-24 MED ORDER — ONDANSETRON HCL 4 MG PO TABS: 4.0000 mg | ORAL_TABLET | Freq: Four times a day (QID) | ORAL | Status: DC | PRN

## 2011-08-24 MED ORDER — ACETAMINOPHEN 325 MG PO TABS: 650.0000 mg | ORAL_TABLET | Freq: Four times a day (QID) | ORAL | Status: DC | PRN

## 2011-08-24 MED ORDER — SODIUM CHLORIDE 0.9 % IV SOLN
INTRAVENOUS | Status: AC
  Administered 2011-08-24 – 2011-08-25 (×2): via INTRAVENOUS

## 2011-08-24 MED ORDER — HYDRALAZINE HCL 20 MG/ML IJ SOLN
10.0000 mg | Freq: Four times a day (QID) | INTRAMUSCULAR | Status: DC | PRN
  Filled 2011-08-24: qty 0.5

## 2011-08-24 NOTE — Assessment & Plan Note (Addendum)
Will admit to medicine service. Given patient's fever and white count of 20 she likely has an acute intraabdominal infection which may be due to diverticulitis, appendicitis, colitis, ovarian torsion or PID. Her LFTs and lipase are normal so this is not likely related to cholecystitis or pancreatitis. Pelvic exam was negative for cervical region pain and chlamydia was in the distant past with no new exposures so this is unlikely. Wet prep and GC/Chlamydia swap collected. Please consult general surgery for concern for perforation and make sure that CT abdomen order gets carried over.

## 2011-08-24 NOTE — Plan of Care (Signed)
Problem: Phase I Progression Outcomes Goal: Initial discharge plan identified Outcome: Completed/Met Date Met:  08/24/11 To return home

## 2011-08-24 NOTE — Consult Note (Signed)
Reason for Consult:abd pain, concern for appendicitis Referring Physician: Dr Margorie John  Ruben Reason [name] is an 46 y.o. female.  HPI: 46 year old obese African American female was admitted earlier this evening from the internal medicine clinic after presenting with complaints of abdominal pain. The patient states that she started to develop some lower abdominal pain on Wednesday night. However the pain worsened on Thursday and she developed a temperature up to 102.5. She had a regularly scheduled appointment with her primary care physician today and discussed her symptoms with her doctors.  She states that she has a constant ache or pressure sensation in her lower abdomen. Occasionally she will have sharp shooting pains in her lower abdomen. Sometimes the pain will be on her right side or around her umbilicus. She is never had any previous symptoms. She does endorse some nausea. She denies any vomiting. Her last bowel movement was on Wednesday. She normally has bowel movements twice a day. She denies any melena or hematochezia. She denies any weight loss. She denies any stool caliber change. She has never had a colonoscopy. She denies any family history of colon cancer.  Her only previous abdominal surgery was an umbilical hernia repair and a tubal ligation.  Past Medical History  Diagnosis Date  . BPPV (benign paroxysmal positional vertigo) 05/07/11    Described as room spinning, notes 2 episodes prior to Oct 2012.  Plans to undergo vestibular rehab.  . Hypertension 05/07/11    with grade 2 diastolic dysfunction  . PVC (premature ventricular contraction) 05/07/11    incidentally seen on telemetry    Past Surgical History  Procedure Date  . Vaginal hysterectomy 07/2010  . Umbilical hernia repair     Approximately 1974  . Tonsillectomy   . Cervical biopsy  w/ loop electrode excision     Family History  Problem Relation Age of Onset  . Diabetes Mother   . Diabetes Maternal Grandmother    . Breast cancer Mother   . Cervical cancer Maternal Aunt   . Deep vein thrombosis Father     died from cerebral embolus    Social History:  reports that she has been smoking Cigars.  She has never used smokeless tobacco. She reports that she drinks alcohol. Her drug history not on file.  Allergies:  Allergies  Allergen Reactions  . Lisinopril Cough    Medications: I have reviewed the patient's current medications.  Results for orders placed during the hospital encounter of 08/24/11 (from the past 48 hour(s))  URINALYSIS, ROUTINE W REFLEX MICROSCOPIC     Status: Normal   Collection Time   08/24/11  9:06 PM      Component Value Range Comment   Color, Urine YELLOW  YELLOW     APPearance CLEAR  CLEAR     Specific Gravity, Urine 1.006  1.005 - 1.030     pH 6.0  5.0 - 8.0     Glucose, UA NEGATIVE  NEGATIVE (mg/dL)    Hgb urine dipstick NEGATIVE  NEGATIVE     Bilirubin Urine NEGATIVE  NEGATIVE     Ketones, ur NEGATIVE  NEGATIVE (mg/dL)    Protein, ur NEGATIVE  NEGATIVE (mg/dL)    Urobilinogen, UA 0.2  0.0 - 1.0 (mg/dL)    Nitrite NEGATIVE  NEGATIVE     Leukocytes, UA NEGATIVE  NEGATIVE  MICROSCOPIC NOT DONE ON URINES WITH NEGATIVE PROTEIN, BLOOD, LEUKOCYTES, NITRITE, OR GLUCOSE <1000 mg/dL.   RADIOLOGICAL STUDIES: I have personally reviewed the radiological exams myself  Ct Abdomen Pelvis W Contrast  08/24/2011  *RADIOLOGY REPORT*  Clinical Data: Right-sided abdominal pain and fever.  CT ABDOMEN AND PELVIS WITH CONTRAST  Technique:  Multidetector CT imaging of the abdomen and pelvis was performed following the standard protocol during bolus administration of intravenous contrast.  Contrast: OMNIPAQUE IOHEXOL 300 MG/ML IV SOLN  Comparison: None.  Findings: The visualized lung bases are clear.  The liver and spleen are unremarkable in appearance.  The gallbladder is within normal limits.  The pancreas and adrenal glands are unremarkable.  The kidneys are unremarkable in  appearance.  There is no evidence of hydronephrosis.  No renal or ureteral stones are seen.  No perinephric stranding is appreciated.  No free fluid is identified.  The small bowel is unremarkable in appearance.  The stomach is within normal limits.  No acute vascular abnormalities are seen.  Scattered calcification is noted along the distal abdominal aorta and its branches.  The appendix is normal in caliber, partially filled with contrast, and containing air, without evidence for appendicitis.  There is focal soft tissue inflammation noted at the mid pelvis adjacent to the mid sigmoid colon, with associated inflamed poorly characterized diverticula, and scattered minimal soft tissue air thought to reflect a microperforation.  This is concerning for acute diverticulitis.  No significant free fluid is seen; there is no evidence for abscess formation.  Contrast progresses to this point in the colon; there is minimal diverticulosis along the distal descending colon.  The bladder is mildly distended and grossly unremarkable in appearance.  The patient is status post hysterectomy; no suspicious adnexal masses are seen.  The ovaries appear relatively symmetric. No inguinal lymphadenopathy is seen.  No acute osseous abnormalities are identified.  IMPRESSION:  1.  Focal diverticulitis noted at the mid sigmoid colon, with soft tissue inflammation and likely microperforation.  No evidence of significant bowel perforation or abscess formation.  No free fluid seen.  2.  No evidence of appendicitis. 3.  Scattered calcification along the distal abdominal aorta and its branches.  Original Report Authenticated By: Tonia Ghent, M.D.    Review of Systems  Constitutional: Positive for fever and chills. Negative for weight loss and malaise/fatigue.  HENT: Negative for hearing loss.   Eyes: Negative for blurred vision and double vision.  Respiratory: Negative for shortness of breath.   Cardiovascular: Negative for chest pain,  palpitations, orthopnea, leg swelling and PND.  Gastrointestinal: Positive for nausea, abdominal pain and constipation. Negative for vomiting, diarrhea, blood in stool and melena.  Genitourinary: Negative for dysuria and frequency.  Musculoskeletal: Negative for joint pain.  Skin: Negative.   Neurological: Negative for dizziness, focal weakness, loss of consciousness, weakness and headaches.  Psychiatric/Behavioral: Negative for depression. The patient is not nervous/anxious.    Blood pressure 123/84, pulse 100, temperature 98.4 F (36.9 C), temperature source Oral, resp. rate 18, height 5\' 7"  (1.702 m), weight 199 lb (90.266 kg), SpO2 96.00%. Physical Exam  Vitals reviewed. Constitutional: She is oriented to person, place, and time. She appears well-developed and well-nourished. No distress.       obese  HENT:  Head: Normocephalic and atraumatic.  Right Ear: External ear normal.  Left Ear: External ear normal.  Eyes: Conjunctivae are normal. No scleral icterus.  Neck: Normal range of motion. Neck supple. No tracheal deviation present. No thyromegaly present.  Cardiovascular: Normal rate, regular rhythm, normal heart sounds and intact distal pulses.   Respiratory: Effort normal and breath sounds normal. No respiratory distress. She has  no wheezes. She has no rales.  GI: Soft. Bowel sounds are normal. There is tenderness in the suprapubic area, left upper quadrant and left lower quadrant. There is no rigidity, no rebound and no guarding. No hernia.         Mild diffuse TTP, mainly in b/l lower abd  Musculoskeletal: Normal range of motion. She exhibits no edema and no tenderness.  Neurological: She is alert and oriented to person, place, and time.  Skin: Skin is warm and dry. No rash noted. She is not diaphoretic. No erythema.  Psychiatric: She has a normal mood and affect. Her behavior is normal. Judgment and thought content normal.    Assessment/Plan: Patient Active Problem List    Diagnoses  . BPPV (benign paroxysmal positional vertigo)  . Hypertension  . PVC (premature ventricular contraction)  . Aphasia of unknown origin  . GERD (gastroesophageal reflux disease)  . Abdominal pain, acute, right lower quadrant  . Sigmoid diverticulitis with microperforation   The patient has sigmoid diverticulitis with a probable microperforation. She has no evidence of peritonitis.  Recommend bowel rest, n.p.o., IV antibiotics such as Cipro and Flagyl. We will follow her clinical course and her labs. Hopefully this will resolve without surgical intervention.  I discussed the etiology and management of diverticulitis with the patient. I discussed her CT findings. We discussed both surgical and nonsurgical management. Based on her current clinical exam and CT findings, I recommended a nonoperative approach for now. We discussed the possibility of failure of this approach. We discussed the possibility of a repeat CT scan. We discussed the possibility of abscess formation or worsening perforation requiring urgent surgery. If we're able to get the patient through this course, she will need an outpatient colonoscopy in 6-8 weeks.  Mary Sella. Andrey Campanile, MD, FACS General, Bariatric, & Minimally Invasive Surgery [facility] Surgery, [place]  [facility] M 08/24/2011, 11:47 PM

## 2011-08-24 NOTE — Progress Notes (Signed)
Subjective:   [name] female   DOB: 1965/09/30 46 y.o.   MRN: [id number]  HPI: [name] is a 46 y.o. Felt great until yesterday, was at work and feeling feeling very cold compared to everyone so she left work and took temperature, found to have fever of102.5 yesterday . No URI symptoms. Had headache last night with fever.  Has pain in lower abdomen since yesterday and temp 101.8 today. Had 800mg  ibuprofen once to treat fever last night, was concerned for being able to take due to having blood pressure meds. Been able to pass urine normally, if anything has increased urination. Haven't had any BM in 2-3 days. Normally goes 2 times a day. Feeling nauseous with eating. Was straining with last BM, no melena or hematochezia. Had umbilical hernia and surgery when she was a child. With urination, has some pain in lower abdomen, not in urethral region. No cloudiness or blood in urine. Feel faint today with walking, has been drinking normally, has only eaten 1 sandwich a day yesterday and today.  No new sexual partners and uses condoms with current partner. Last tested about year, chlamydia many years ago, was treated at that time.   Past Medical History  Diagnosis Date  . BPPV (benign paroxysmal positional vertigo) 05/07/11    Described as room spinning, notes 2 episodes prior to Oct 2012.  Plans to undergo vestibular rehab.  . Hypertension 05/07/11    with grade 2 diastolic dysfunction  . PVC (premature ventricular contraction) 05/07/11    incidentally seen on telemetry   Current Outpatient Prescriptions  Medication Sig Dispense Refill  . hydrochlorothiazide (MICROZIDE) 12.5 MG capsule Take 1 capsule (12.5 mg total) by mouth daily.  30 capsule  6  . ibuprofen (ADVIL,MOTRIN) 800 MG tablet Take 800 mg by mouth every 8 (eight) hours as needed.        . Multiple Vitamin (MULTIVITAMIN) tablet Take 1 tablet by mouth daily.         Family History  Problem Relation Age of  Onset  . Diabetes Mother   . Diabetes Maternal Grandmother   . Breast cancer Mother   . Cervical cancer Maternal Aunt   . Deep vein thrombosis Father     died from cerebral embolus   History   Social History  . Marital Status: Single    Spouse Name: N/A    Number of Children: 2  . Years of Education: N/A   Occupational History  .     Social History Main Topics  . Smoking status: Current Some Day Smoker -- 0.1 packs/day for 20 years    Types: Cigars  . Smokeless tobacco: Never Used   Comment: 1 black & mild a week  . Alcohol Use: Yes     1 drink every few months  . Drug Use: Not on file  . Sexually Active: Not on file   Other Topics Concern  . Not on file   Social History Narrative   Has two children and one granddaughter. Lives by herself.   Review of Systems: Constitutional: Reports fever, chills, appetite change and fatigue.  HEENT: Denies congestion, sore throat, rhinorrhea, sneezing, neck stiffness  Respiratory: Denies SOB, DOE, cough, chest tightness,  and wheezing.   Gastrointestinal: Denies, vomiting, diarrhea, blood in stoolreports nausea, abdominal pain,  constipation, and abdominal distention.  Genitourinary: Denies dysuria, urgency, hematuria, flank pain and difficulty urinating.  Neurological: Reports headache   Objective:  Physical Exam: Filed Vitals:  08/24/11 1607  BP: 131/83  Pulse: 116  Temp: 101.5 F (38.6 C)  TempSrc: Oral  Height: 5\' 7"  (1.702 m)  Weight: 199 lb 3.2 oz (90.357 kg)  SpO2: 99%   General: middle aged woman resting in bed in mild distress HEENT: PERRL, EOMI, no scleral icterus Cardiac: tachycardic rate, normal rhythm no rubs, murmurs or gallops Pulm: clear to auscultation bilaterally, moving normal volumes of air Abd: soft, tender to palpation in focal area slightly right of center in lower abdomen, mildly distended, BS present Ext: warm and well perfused, no pedal edema Neuro: alert and oriented X3, cranial nerves  II-XII grossly intact Assessment & Plan:

## 2011-08-24 NOTE — Progress Notes (Signed)
08/24/11 NSG 3435 46 year old female, admitted to 19 with abd. Pain and fevers from home alone.  A & O x 3, ambulatory..  Plan to return home when medically stable.  Plan of care to find source of Pain and decrease fevers.  Pt. Watched  Safety video, given pt. Guide and oriented to room and unit.   Will continue to monitor and assist with d/c planning as pt. Progresses.  Forbes Cellar, RN

## 2011-08-24 NOTE — H&P (Signed)
Hospital Admission Note Date: 08/24/2011  Patient name: [name] Medical record number: [id number] Date of birth: 1966-04-01 Age: 46 y.o. Gender: female PCP: Margorie John, MD, MD  Medical Service: Int Med  Attending physician:  Drue Second    Pager: Resident (R2/R3): Marine    Pager: [phone] Resident (R1): [place]   Pager: [phone]  Chief Complaint: abdominal pain  History of Present Illness: [name] is a 46 y.o. F with a h/o HTN who p/w abdominal pain.  The pt felt great until yesterday morning when she began experiencing some lower abdominal pain.  Her abdominal pain is a crampy pressure that is relieved by passing occasional gas and is constant in nature.  She has not had a stool in 3 days.  At work she started feeling very cold and had a temp of102.5. She took ibuprofen with relief to 99.  Has had nausea but no vomiting, also had anorexia but has been able to eat a little today.  No URI symptoms, passing gas, no dysuria or urinary frequency. No melena or hematochezia. Had umbilical surgery as child and is s/p hysterectomy last year.  Past Medical History: Past Medical History  Diagnosis Date  . BPPV (benign paroxysmal positional vertigo) 05/07/11    Described as room spinning, notes 2 episodes prior to Oct 2012.  Plans to undergo vestibular rehab.  . Hypertension 05/07/11    with grade 2 diastolic dysfunction  . PVC (premature ventricular contraction) 05/07/11    incidentally seen on telemetry   Past Surgical History  Procedure Date  . Vaginal hysterectomy 07/2010  . Umbilical hernia repair     Approximately 1974  . Tonsillectomy   . Cervical biopsy  w/ loop electrode excision     Meds: Prior to Admission medications   Medication Sig Start Date End Date Taking? Authorizing Provider  hydrochlorothiazide (MICROZIDE) 12.5 MG capsule Take 1 capsule (12.5 mg total) by mouth daily. 05/17/11 05/16/12  Janalyn Harder, MD  ibuprofen (ADVIL,MOTRIN) 800 MG tablet Take 800 mg  by mouth every 8 (eight) hours as needed.      Historical Provider, MD  Multiple Vitamin (MULTIVITAMIN) tablet Take 1 tablet by mouth daily.      Historical Provider, MD    Allergies: Lisinopril  Family Hx: Family History  Problem Relation Age of Onset  . Diabetes Mother   . Diabetes Maternal Grandmother   . Breast cancer Mother   . Cervical cancer Maternal Aunt   . Deep vein thrombosis Father     died from cerebral embolus    Social Hx: History   Social History  . Marital Status: Single    Spouse Name: N/A    Number of Children: 2  . Years of Education: N/A   Occupational History  .     Social History Main Topics  . Smoking status: Current Some Day Smoker -- 0.1 packs/day for 20 years    Types: Cigars  . Smokeless tobacco: Never Used   Comment: 1 black & mild a week  . Alcohol Use: Yes     1 drink every few months  . Drug Use: Not on file  . Sexually Active: Not on file   Other Topics Concern  . Not on file   Social History Narrative   Has two children and one granddaughter. Lives by herself.    Review of Systems: As per HPI  Physical Exam: Filed Vitals:   08/24/11 1816  BP: 115/81  Pulse: 121  Temp: 98.6 F (  37 C)  Resp: 18  Height: 5\' 7"  (1.702 m)  Weight: 199 lb (90.266 kg)  SpO2: 95%   GEN: No apparent distress but sitting still on examination table.  Alert and oriented x 3.  Pleasant, conversant, and cooperative to exam. HEENT: head is autraumatic and normocephalic. RESP:  Lungs are clear to ascultation bilaterally with good air movement.  No wheezes, ronchi, or rubs. CARDIOVASCULAR: tachycardic rate, normal rhythm.  Clear S1, S2, no murmurs, gallops, or rubs. ABDOMEN: soft, moderate TTP over suprapubic area, no guarding, + rebound.  + obturator sign, neg rovsing's sign. EXT: warm and dry.  Peripheral pulses equal, intact, and +2 globally.  No clubbing or cyanosis.  No edema in b/l lower extremeties SKIN: warm and dry with normal turgor.  No  rashes or abnormal lesions observed.  Lab results: Basic Metabolic Panel:  Basename 08/24/11 1623  NA 137  K 3.3*  CL 100  CO2 26  GLUCOSE 105*  BUN 15  CREATININE 0.99  CALCIUM 10.1  MG --  PHOS --   Liver Function Tests:  Basename 08/24/11 1623  AST 21  ALT 16  ALKPHOS 104  BILITOT 0.5  PROT 8.1  ALBUMIN 3.7    Basename 08/24/11 1623  LIPASE 23  AMYLASE --   No results found for this basename: AMMONIA:2 in the last 72 hours CBC:  Basename 08/24/11 1623  WBC 20.8*  NEUTROABS 16.1*  HGB 13.4  HCT 38.2  MCV 93.9  PLT 221    Assessment & Plan by Problem: # Abdominal Pain Fever, leukocytosis, and history are all concerning for acute appendicitis. Patient's abdominal pain, however, is only moderately tender to palpation and located primarily in the suprapubic area. She does have a positive psoas sign and has rebound, but she is not classic for appy. The differential includes acute appendicitis, SBO, constipation, UTI, or gastroenteritis. Much less likely ovarian torsion given the location and severe of the pain.  She definitely needs a CT for w/u and potentially a surgical consult. - Urinalysis - CT with contrast - Surgical consult - NPO - Pain medicines as needed - zofran as needed - NS @ 125/hr for 12 hours  # Hypertension Was controlled on HCTZ only, and was controlled in clinic today. - Hold HCTZ - hydralazine PRN  # Constipation May be her primary problem or may be 2/2 to alternative pathology.  Will treat as case develops. - consider bowel regimen  # Hypokalemia Mild at 3.3, will not replete right now. - check bmet in a.m.  # DVT Ppx: - lovenox

## 2011-08-24 NOTE — Progress Notes (Signed)
Report called to Nurse on 5500.  Pt transported to 5507 via wheelchair.  Angelina Ok, RN 08/24/2011 6:05 PM

## 2011-08-25 ENCOUNTER — Encounter (HOSPITAL_COMMUNITY): Payer: Self-pay | Admitting: *Deleted

## 2011-08-25 LAB — BASIC METABOLIC PANEL
CO2: 25 mEq/L (ref 19–32)
Calcium: 9.4 mg/dL (ref 8.4–10.5)
Chloride: 103 mEq/L (ref 96–112)
Glucose, Bld: 95 mg/dL (ref 70–99)
Sodium: 138 mEq/L (ref 135–145)

## 2011-08-25 LAB — CBC
Hemoglobin: 12.1 g/dL (ref 12.0–15.0)
MCH: 32.3 pg (ref 26.0–34.0)
Platelets: 207 10*3/uL (ref 150–400)
RBC: 3.75 MIL/uL — ABNORMAL LOW (ref 3.87–5.11)
WBC: 16.4 10*3/uL — ABNORMAL HIGH (ref 4.0–10.5)

## 2011-08-25 MED ORDER — CHLORHEXIDINE GLUCONATE 0.12 % MT SOLN
15.0000 mL | Freq: Two times a day (BID) | OROMUCOSAL | Status: DC
  Administered 2011-08-25 – 2011-08-27 (×4): 15 mL via OROMUCOSAL
  Filled 2011-08-25 (×8): qty 15

## 2011-08-25 MED ORDER — KCL IN DEXTROSE-NACL 20-5-0.45 MEQ/L-%-% IV SOLN
INTRAVENOUS | Status: AC
  Administered 2011-08-25 – 2011-08-26 (×2): via INTRAVENOUS
  Filled 2011-08-25 (×3): qty 1000

## 2011-08-25 MED ORDER — BIOTENE DRY MOUTH MT LIQD
15.0000 mL | Freq: Two times a day (BID) | OROMUCOSAL | Status: DC
  Administered 2011-08-25 – 2011-08-26 (×2): 15 mL via OROMUCOSAL

## 2011-08-25 NOTE — Progress Notes (Signed)
Subjective: Pt with pain still, but no worsening.  No n/v.  Objective: Vital signs in last 24 hours: Temp:  [98.4 F (36.9 C)-101.5 F (38.6 C)] 100.3 F (37.9 C) (02/16 0547) Pulse Rate:  [100-121] 107  (02/16 0547) Resp:  [18] 18  (02/16 0547) BP: (110-131)/(77-84) 110/77 mmHg (02/16 0547) SpO2:  [95 %-99 %] 95 % (02/16 0547) Weight:  [199 lb (90.266 kg)-199 lb 3.2 oz (90.357 kg)] 199 lb (90.266 kg) (02/15 1816) Last BM Date: 08/21/11  Intake/Output from previous day: 02/15 0701 - 02/16 0700 In: 1127.1 [I.V.:977.1; IV Piggyback:150] Out: 400 [Urine:400] Intake/Output this shift: Total I/O In: 100 [IV Piggyback:100] Out: -   Sleeping, but arousable. Alert and oriented x3 once awake. Abd soft, mildly distended.  Localized tenderness in LLQ and suprapubic location.  Lab Results:   [facility] 08/25/11 0618 08/24/11 1623  WBC 16.4* 20.8*  HGB 12.1 13.4  HCT 34.9* 38.2  PLT 207 221   BMET  Basename 08/25/11 0618 08/24/11 1623  NA 138 137  K 3.2* 3.3*  CL 103 100  CO2 25 26  GLUCOSE 95 105*  BUN 12 15  CREATININE 0.75 0.99  CALCIUM 9.4 10.1   PT/INR No results found for this basename: LABPROT:2,INR:2 in the last 72 hours ABG No results found for this basename: PHART:2,PCO2:2,PO2:2,HCO3:2 in the last 72 hours  Studies/Results: Ct Abdomen Pelvis W Contrast  08/24/2011  *RADIOLOGY REPORT*  Clinical Data: Right-sided abdominal pain and fever.  CT ABDOMEN AND PELVIS WITH CONTRAST  Technique:  Multidetector CT imaging of the abdomen and pelvis was performed following the standard protocol during bolus administration of intravenous contrast.  Contrast: OMNIPAQUE IOHEXOL 300 MG/ML IV SOLN  Comparison: None.  Findings: The visualized lung bases are clear.  The liver and spleen are unremarkable in appearance.  The gallbladder is within normal limits.  The pancreas and adrenal glands are unremarkable.  The kidneys are unremarkable in appearance.  There is no evidence  of hydronephrosis.  No renal or ureteral stones are seen.  No perinephric stranding is appreciated.  No free fluid is identified.  The small bowel is unremarkable in appearance.  The stomach is within normal limits.  No acute vascular abnormalities are seen.  Scattered calcification is noted along the distal abdominal aorta and its branches.  The appendix is normal in caliber, partially filled with contrast, and containing air, without evidence for appendicitis.  There is focal soft tissue inflammation noted at the mid pelvis adjacent to the mid sigmoid colon, with associated inflamed poorly characterized diverticula, and scattered minimal soft tissue air thought to reflect a microperforation.  This is concerning for acute diverticulitis.  No significant free fluid is seen; there is no evidence for abscess formation.  Contrast progresses to this point in the colon; there is minimal diverticulosis along the distal descending colon.  The bladder is mildly distended and grossly unremarkable in appearance.  The patient is status post hysterectomy; no suspicious adnexal masses are seen.  The ovaries appear relatively symmetric. No inguinal lymphadenopathy is seen.  No acute osseous abnormalities are identified.  IMPRESSION:  1.  Focal diverticulitis noted at the mid sigmoid colon, with soft tissue inflammation and likely microperforation.  No evidence of significant bowel perforation or abscess formation.  No free fluid seen.  2.  No evidence of appendicitis. 3.  Scattered calcification along the distal abdominal aorta and its branches.  Original Report Authenticated By: Tonia Ghent, M.D.    Anti-infectives: Anti-infectives  Start     Dose/Rate Route Frequency Ordered Stop   08/24/11 2300   cefTRIAXone (ROCEPHIN) 1 g in dextrose 5 % 50 mL IVPB        1 g 100 mL/hr over 30 Minutes Intravenous Every 24 hours 08/24/11 2251     08/24/11 2245   metroNIDAZOLE (FLAGYL) IVPB 500 mg        500 mg 100 mL/hr over  60 Minutes Intravenous Every 8 hours 08/24/11 2251            Assessment/Plan: s/p * No surgery found * Diverticulitis with microperforation Continue nonoperative management.   NPO IV antibiotics WBCs down, hope this trend will be followed by improvement in symptoms.   Will follow.    LOS: 1 day    [facility] 08/25/2011

## 2011-08-25 NOTE — Progress Notes (Signed)
Received Abdominal CT report indicating " Focal diverticulitis noted at the mid sigmoid colon, with soft  tissue inflammation and likely microperforation." General Surgeon Dr. Andrey Campanile was contacted and saw patient. The plan is to remain bowel rest, NPO, IV ABX, and close f/u. ( please refer to Dr. Tawana Scale note)  S: Patient states that she understands the plan. She reports 6/10 B/L lower abd sharp pain which is unchanged. O: B/L lower abd diffused TTP. No rebound tenderness. BS hypoactive x4. A/P - will start her on ceftriaxone and Flagyl based on guideline for complicated diverticulitis - remain NPO,  - will D/C Lovenox for now due to ? pending surgery if her condition deteriorates tonight. -place SCD for VTE -close monitor patient

## 2011-08-25 NOTE — Progress Notes (Addendum)
Subjective: Feeling okay, still with some mid abdominal pain.  Objective: Vital signs in last 24 hours: Filed Vitals:   08/24/11 1816 08/24/11 2128 08/25/11 0547  BP: 115/81 123/84 110/77  Pulse: 121 100 107  Temp: 98.6 F (37 C) 98.4 F (36.9 C) 100.3 F (37.9 C)  TempSrc:  Oral Oral  Resp: 18 18 18   Height: 5\' 7"  (1.702 m)    Weight: 199 lb (90.266 kg)    SpO2: 95% 96% 95%   Weight change:   Intake/Output Summary (Last 24 hours) at 08/25/11 1141 Last data filed at 08/25/11 0750  Gross per 24 hour  Intake 1227.09 ml  Output    400 ml  Net 827.09 ml   Physical Exam: Gen: NAD, pleasant Abd: soft, TTP in suprapubic region, no guarding  Lab Results: Basic Metabolic Panel:  Lab 08/25/11 1610 08/24/11 1623  NA 138 137  K 3.2* 3.3*  CL 103 100  CO2 25 26  GLUCOSE 95 105*  BUN 12 15  CREATININE 0.75 0.99  CALCIUM 9.4 10.1  MG -- --  PHOS -- --   Liver Function Tests:  Lab 08/24/11 1623  AST 21  ALT 16  ALKPHOS 104  BILITOT 0.5  PROT 8.1  ALBUMIN 3.7    Lab 08/24/11 1623  LIPASE 23  AMYLASE --   CBC:  Lab 08/25/11 0618 08/24/11 1623  WBC 16.4* 20.8*  NEUTROABS -- 16.1*  HGB 12.1 13.4  HCT 34.9* 38.2  MCV 93.1 93.9  PLT 207 221   Urinalysis:  Lab 08/24/11 2106  COLORURINE YELLOW  LABSPEC 1.006  PHURINE 6.0  GLUCOSEU NEGATIVE  HGBUR NEGATIVE  BILIRUBINUR NEGATIVE  KETONESUR NEGATIVE  PROTEINUR NEGATIVE  UROBILINOGEN 0.2  NITRITE NEGATIVE  LEUKOCYTESUR NEGATIVE    Micro Results: No results found for this or any previous visit (from the past 240 hour(s)). Studies/Results: Ct Abdomen Pelvis W Contrast  08/24/2011  *RADIOLOGY REPORT*  Clinical Data: Right-sided abdominal pain and fever.  CT ABDOMEN AND PELVIS WITH CONTRAST  Technique:  Multidetector CT imaging of the abdomen and pelvis was performed following the standard protocol during bolus administration of intravenous contrast.  Contrast: OMNIPAQUE IOHEXOL 300 MG/ML IV SOLN   Comparison: None.  Findings: The visualized lung bases are clear.  The liver and spleen are unremarkable in appearance.  The gallbladder is within normal limits.  The pancreas and adrenal glands are unremarkable.  The kidneys are unremarkable in appearance.  There is no evidence of hydronephrosis.  No renal or ureteral stones are seen.  No perinephric stranding is appreciated.  No free fluid is identified.  The small bowel is unremarkable in appearance.  The stomach is within normal limits.  No acute vascular abnormalities are seen.  Scattered calcification is noted along the distal abdominal aorta and its branches.  The appendix is normal in caliber, partially filled with contrast, and containing air, without evidence for appendicitis.  There is focal soft tissue inflammation noted at the mid pelvis adjacent to the mid sigmoid colon, with associated inflamed poorly characterized diverticula, and scattered minimal soft tissue air thought to reflect a microperforation.  This is concerning for acute diverticulitis.  No significant free fluid is seen; there is no evidence for abscess formation.  Contrast progresses to this point in the colon; there is minimal diverticulosis along the distal descending colon.  The bladder is mildly distended and grossly unremarkable in appearance.  The patient is status post hysterectomy; no suspicious adnexal masses are seen.  The ovaries appear relatively symmetric. No inguinal lymphadenopathy is seen.  No acute osseous abnormalities are identified.  IMPRESSION:  1.  Focal diverticulitis noted at the mid sigmoid colon, with soft tissue inflammation and likely microperforation.  No evidence of significant bowel perforation or abscess formation.  No free fluid seen.  2.  No evidence of appendicitis. 3.  Scattered calcification along the distal abdominal aorta and its branches.  Original Report Authenticated By: Tonia Ghent, M.D.   Medications: I have reviewed the patient's current  medications. Scheduled Meds:   . antiseptic oral rinse  15 mL Mouth Rinse q12n4p  . cefTRIAXone (ROCEPHIN)  IV  1 g Intravenous Q24H  . chlorhexidine  15 mL Mouth Rinse BID  . iohexol  20 mL Oral Q1 Hr x 2  . metronidazole  500 mg Intravenous Q8H  . DISCONTD: enoxaparin  40 mg Subcutaneous Q24H   Continuous Infusions:   . sodium chloride 125 mL/hr at 08/25/11 0614   PRN Meds:.acetaminophen, acetaminophen, docusate sodium, hydrALAZINE, HYDROcodone-acetaminophen, iohexol, morphine, ondansetron (ZOFRAN) IV, ondansetron  Assessment/Plan: # Diverticulitis CT scan showed sigmoid diverticulitis with microperforations.  Surgery consulted, plan for nonoperative management for now.  WBC trended down overnight, temp to 100 only.  Pt's exam about the same as yesterday.  Will con't to evaluate and hope that vitals/labs/exam con't to improve and that the pt can avoid surgery. - appreciate surgery following - ceftriaxone 1g q24 - metronidazole 500mg  TID - NPO  - Pain medicines as needed  - zofran as needed  - D5 1/2 NS + 20 K @ 100  # Hypertension  Was controlled on HCTZ only, and was controlled in clinic today.  - Hold HCTZ  - hydralazine PRN   # Hypokalemia  Mild at 3.2 - will get some K through IVF   LOS: 1 day   [facility], BEN 08/25/2011, 11:41 AM  Internal Medicine Teaching Service Attending Note Date: 08/25/2011  Patient name: [name]  Medical record number: [id number]  Date of birth: [birth year]    This patient has been seen and discussed with the house staff. Please see their note for complete details. I concur with their findings with the following additions/corrections: patient appears to have responded to iv antibiotics since she has had some improvement in her leukocytosis and no further signs of fever. We will continue to follow her signs for any clinical deterioration that would warrant surgery to re-evaluate the patient.  Judyann Munson 08/25/2011, 2:17  PM

## 2011-08-25 NOTE — H&P (Signed)
Internal Medicine Teaching Service Attending Note Date: 08/25/2011  Patient name: [name]  Medical record number: [id number]  Date of birth: [birth year]   I have seen and evaluated [name] and discussed their care with the Residency Team. [name] is a pleasant 46yo F with history of HTN, who presents with 2 day history of abdominal pain and fever. On exam, she had suprapubic abdominal pain. She was found to have a leukocytosis of 20 and abdominal CT showed signs of diverticulitis with microperforation. She was started on ceftriaxone and metronidazole for enteric pathogen coverage. She is being followed by Surgery Consult to see if she clinically worsens to need surgery.  Physical Exam: Blood pressure 110/77, pulse 107, temperature 100.3 F (37.9 C), temperature source Oral, resp. rate 18, height 5\' 7"  (1.702 m), weight 199 lb (90.266 kg), SpO2 95.00%. GEN:  Alert and oriented x 3. Pleasant, conversant, and cooperative to exam. In NAD HEENT: AT/[place], PERRLA, EOMI, no scleral icterus. MMM, no OP erythema.  RESP: Lungs are clear to ascultation bilaterally with good air movement. No wheezes, ronchi, or rubs.  CARDIOVASCULAR: tachycardic rate, normal rhythm. Clear S1, S2, no murmurs, gallops, or rubs.  ABDOMEN: soft, moderate TTP over suprapubic area, no guarding, + rebound. + obturator sign, neg rovsing's sign.  EXT: warm and dry. Peripheral pulses equal, intact, and +2 globally. No clubbing or cyanosis. No edema in b/l lower extremeties  SKIN: warm and dry with normal turgor. No rashes or abnormal lesions observed.  Lab results: Results for orders placed during the hospital encounter of 08/24/11 (from the past 24 hour(s))  URINALYSIS, ROUTINE W REFLEX MICROSCOPIC     Status: Normal   Collection Time   08/24/11  9:06 PM      Component Value Range   Color, Urine YELLOW  YELLOW    APPearance CLEAR  CLEAR    Specific Gravity, Urine 1.006  1.005 - 1.030    pH 6.0  5.0 - 8.0    Glucose, UA NEGATIVE  NEGATIVE (mg/dL)   Hgb urine dipstick NEGATIVE  NEGATIVE    Bilirubin Urine NEGATIVE  NEGATIVE    Ketones, ur NEGATIVE  NEGATIVE (mg/dL)   Protein, ur NEGATIVE  NEGATIVE (mg/dL)   Urobilinogen, UA 0.2  0.0 - 1.0 (mg/dL)   Nitrite NEGATIVE  NEGATIVE    Leukocytes, UA NEGATIVE  NEGATIVE   CBC     Status: Abnormal   Collection Time   08/25/11  6:18 AM      Component Value Range   WBC 16.4 (*) 4.0 - 10.5 (K/uL)   RBC 3.75 (*) 3.87 - 5.11 (MIL/uL)   Hemoglobin 12.1  12.0 - 15.0 (g/dL)   HCT 40.9 (*) 81.1 - 46.0 (%)   MCV 93.1  78.0 - 100.0 (fL)   MCH 32.3  26.0 - 34.0 (pg)   MCHC 34.7  30.0 - 36.0 (g/dL)   RDW 91.4  78.2 - 95.6 (%)   Platelets 207  150 - 400 (K/uL)  BASIC METABOLIC PANEL     Status: Abnormal   Collection Time   08/25/11  6:18 AM      Component Value Range   Sodium 138  135 - 145 (mEq/L)   Potassium 3.2 (*) 3.5 - 5.1 (mEq/L)   Chloride 103  96 - 112 (mEq/L)   CO2 25  19 - 32 (mEq/L)   Glucose, Bld 95  70 - 99 (mg/dL)   BUN 12  6 - 23 (mg/dL)   Creatinine,  Ser 0.75  0.50 - 1.10 (mg/dL)   Calcium 9.4  8.4 - 62.1 (mg/dL)   GFR calc non Af Amer >90  >90 (mL/min)   GFR calc Af Amer >90  >90 (mL/min)    Imaging results:  Ct Abdomen Pelvis W Contrast  08/24/2011  *RADIOLOGY REPORT*  Clinical Data: Right-sided abdominal pain and fever.  CT ABDOMEN AND PELVIS WITH CONTRAST  Technique:  Multidetector CT imaging of the abdomen and pelvis was performed following the standard protocol during bolus administration of intravenous contrast.  Contrast: OMNIPAQUE IOHEXOL 300 MG/ML IV SOLN  Comparison: None.  Findings: The visualized lung bases are clear.  The liver and spleen are unremarkable in appearance.  The gallbladder is within normal limits.  The pancreas and adrenal glands are unremarkable.  The kidneys are unremarkable in appearance.  There is no evidence of hydronephrosis.  No renal or ureteral stones are seen.  No perinephric stranding is  appreciated.  No free fluid is identified.  The small bowel is unremarkable in appearance.  The stomach is within normal limits.  No acute vascular abnormalities are seen.  Scattered calcification is noted along the distal abdominal aorta and its branches.  The appendix is normal in caliber, partially filled with contrast, and containing air, without evidence for appendicitis.  There is focal soft tissue inflammation noted at the mid pelvis adjacent to the mid sigmoid colon, with associated inflamed poorly characterized diverticula, and scattered minimal soft tissue air thought to reflect a microperforation.  This is concerning for acute diverticulitis.  No significant free fluid is seen; there is no evidence for abscess formation.  Contrast progresses to this point in the colon; there is minimal diverticulosis along the distal descending colon.  The bladder is mildly distended and grossly unremarkable in appearance.  The patient is status post hysterectomy; no suspicious adnexal masses are seen.  The ovaries appear relatively symmetric. No inguinal lymphadenopathy is seen.  No acute osseous abnormalities are identified.  IMPRESSION:  1.  Focal diverticulitis noted at the mid sigmoid colon, with soft tissue inflammation and likely microperforation.  No evidence of significant bowel perforation or abscess formation.  No free fluid seen.  2.  No evidence of appendicitis. 3.  Scattered calcification along the distal abdominal aorta and its branches.  Original Report Authenticated By: Tonia Ghent, M.D.    Assessment and Plan: I agree with the formulated Assessment and Plan as described by Dr. Abner Greenspan.

## 2011-08-26 DIAGNOSIS — R1031 Right lower quadrant pain: Secondary | ICD-10-CM

## 2011-08-26 LAB — BASIC METABOLIC PANEL
BUN: 12 mg/dL (ref 6–23)
CO2: 26 mEq/L (ref 19–32)
Chloride: 105 mEq/L (ref 96–112)
Creatinine, Ser: 0.69 mg/dL (ref 0.50–1.10)
GFR calc Af Amer: >90 mL/min (ref >90)
Glucose, Bld: 117 mg/dL — ABNORMAL HIGH (ref 70–99)
Potassium: 3.6 mEq/L (ref 3.5–5.1)

## 2011-08-26 LAB — CBC
HCT: 32.2 % — ABNORMAL LOW (ref 36.0–46.0)
Hemoglobin: 11.2 g/dL — ABNORMAL LOW (ref 12.0–15.0)
MCV: 93.6 fL (ref 78.0–100.0)
RBC: 3.44 MIL/uL — ABNORMAL LOW (ref 3.87–5.11)
RDW: 12.2 % (ref 11.5–15.5)
WBC: 10.3 10*3/uL (ref 4.0–10.5)

## 2011-08-26 LAB — URINE CULTURE

## 2011-08-26 NOTE — Progress Notes (Addendum)
Subjective: Abd pain has significantly improved.  Afebrile overnight.  Feels "much better".  Objective: Vital signs in last 24 hours: Filed Vitals:   08/24/11 2128 08/25/11 0547 08/25/11 2100 08/26/11 0544  BP: 123/84 110/77 115/76 111/75  Pulse: 100 107 73 73  Temp: 98.4 F (36.9 C) 100.3 F (37.9 C) 98.5 F (36.9 C) 98.2 F (36.8 C)  TempSrc: Oral Oral Oral Oral  Resp: 18 18 16 16   Height:      Weight:      SpO2: 96% 95% 98% 94%   Weight change:   Intake/Output Summary (Last 24 hours) at 08/26/11 1204 Last data filed at 08/26/11 1100  Gross per 24 hour  Intake   2580 ml  Output    300 ml  Net   2280 ml   Physical Exam: General: alert, cooperative, and in no apparent distress HEENT: pupils equal round and reactive to light, vision grossly intact, oropharynx clear and non-erythematous  Neck: supple, no lymphadenopathy Lungs: clear to ascultation bilaterally, normal work of respiration, no wheezes, rales, ronchi Heart: regular rate and rhythm, no murmurs, gallops, or rubs Abdomen: soft, moderately tender to suprapubic palpation, non-distended, normal bowel sounds Extremities: no cyanosis, clubbing, or edema Neurologic: alert & oriented X3, cranial nerves II-XII intact, strength grossly intact, sensation intact to light touch   Lab Results: Basic Metabolic Panel:  Lab 08/26/11 0960 08/25/11 0618  NA 138 138  K 3.6 3.2*  CL 105 103  CO2 26 25  GLUCOSE 117* 95  BUN 12 12  CREATININE 0.69 0.75  CALCIUM 9.1 9.4  MG -- --  PHOS -- --   Liver Function Tests:  Lab 08/24/11 1623  AST 21  ALT 16  ALKPHOS 104  BILITOT 0.5  PROT 8.1  ALBUMIN 3.7    Lab 08/24/11 1623  LIPASE 23  AMYLASE --   CBC:  Lab 08/26/11 0610 08/25/11 0618 08/24/11 1623  WBC 10.3 16.4* --  NEUTROABS -- -- 16.1*  HGB 11.2* 12.1 --  HCT 32.2* 34.9* --  MCV 93.6 93.1 --  PLT 192 207 --   Urinalysis:  Lab 08/24/11 2106  COLORURINE YELLOW  LABSPEC 1.006  PHURINE 6.0  GLUCOSEU  NEGATIVE  HGBUR NEGATIVE  BILIRUBINUR NEGATIVE  KETONESUR NEGATIVE  PROTEINUR NEGATIVE  UROBILINOGEN 0.2  NITRITE NEGATIVE  LEUKOCYTESUR NEGATIVE    Studies/Results: Ct Abdomen Pelvis W Contrast  08/24/2011  *RADIOLOGY REPORT*  Clinical Data: Right-sided abdominal pain and fever.  CT ABDOMEN AND PELVIS WITH CONTRAST  Technique:  Multidetector CT imaging of the abdomen and pelvis was performed following the standard protocol during bolus administration of intravenous contrast.  Contrast: OMNIPAQUE IOHEXOL 300 MG/ML IV SOLN  Comparison: None.  Findings: The visualized lung bases are clear.  The liver and spleen are unremarkable in appearance.  The gallbladder is within normal limits.  The pancreas and adrenal glands are unremarkable.  The kidneys are unremarkable in appearance.  There is no evidence of hydronephrosis.  No renal or ureteral stones are seen.  No perinephric stranding is appreciated.  No free fluid is identified.  The small bowel is unremarkable in appearance.  The stomach is within normal limits.  No acute vascular abnormalities are seen.  Scattered calcification is noted along the distal abdominal aorta and its branches.  The appendix is normal in caliber, partially filled with contrast, and containing air, without evidence for appendicitis.  There is focal soft tissue inflammation noted at the mid pelvis adjacent to the mid sigmoid  colon, with associated inflamed poorly characterized diverticula, and scattered minimal soft tissue air thought to reflect a microperforation.  This is concerning for acute diverticulitis.  No significant free fluid is seen; there is no evidence for abscess formation.  Contrast progresses to this point in the colon; there is minimal diverticulosis along the distal descending colon.  The bladder is mildly distended and grossly unremarkable in appearance.  The patient is status post hysterectomy; no suspicious adnexal masses are seen.  The ovaries appear  relatively symmetric. No inguinal lymphadenopathy is seen.  No acute osseous abnormalities are identified.  IMPRESSION:  1.  Focal diverticulitis noted at the mid sigmoid colon, with soft tissue inflammation and likely microperforation.  No evidence of significant bowel perforation or abscess formation.  No free fluid seen.  2.  No evidence of appendicitis. 3.  Scattered calcification along the distal abdominal aorta and its branches.  Original Report Authenticated By: Tonia Ghent, M.D.   Medications: I have reviewed the patient's current medications. Scheduled Meds:    . antiseptic oral rinse  15 mL Mouth Rinse q12n4p  . cefTRIAXone (ROCEPHIN)  IV  1 g Intravenous Q24H  . chlorhexidine  15 mL Mouth Rinse BID  . metronidazole  500 mg Intravenous Q8H   Continuous Infusions:    . dextrose 5 % and 0.45 % NaCl with KCl 20 mEq/L 100 mL/hr at 08/26/11 0247   PRN Meds:.acetaminophen, acetaminophen, docusate sodium, hydrALAZINE, HYDROcodone-acetaminophen, morphine, ondansetron (ZOFRAN) IV, ondansetron  Assessment/Plan: # Diverticulitis CT scan showed sigmoid diverticulitis with microperforations.  Surgery consulted, plan for nonoperative management for now.  Afebrile, WBC downtrending, significantly decreased pain this morning. - appreciate surgery following - ceftriaxone 1g q24 - metronidazole 500mg  TID - advance diet to sips of clears - Pain medicines as needed  - zofran as needed  - IVF  # Hypertension  - Was controlled on HCTZ only, and was controlled in clinic today.  - Hold HCTZ  - hydralazine PRN   # Hypokalemia - mild, acute -repleting   LOS: 2 days   Janalyn Harder 08/26/2011, 12:04 PM  Internal Medicine Teaching Service Attending Note Date: 08/26/2011  Patient name: [name]  Medical record number: [id number]  Date of birth: 05-25-1966    This patient has been seen and discussed with the house staff. Please see their note for complete details. I concur with  their findings with the following additions/corrections: patient continue on iv abtx today and clears to see how she handles initiation of oral intake. If continues to be abdominal pain free, will ADAT and switch to oral equivalent for abtx. Reassuring that her leukocytosis has resolved, and no fevers, nor clinical worsening.  [name] 08/26/2011, 12:56 PM

## 2011-08-26 NOTE — Progress Notes (Signed)
[name], female   DOB: 10/31/1965, 46 y.o.   MRN: [id number]    Subjective: Pt with significant improvement in pain.  No n/v.  White count back to normal.    Objective: Vital signs in last 24 hours: Temp:  [98.2 F (36.8 C)-98.5 F (36.9 C)] 98.2 F (36.8 C) (02/17 0544) Pulse Rate:  [73] 73  (02/17 0544) Resp:  [16] 16  (02/17 0544) BP: (111-115)/(75-76) 111/75 mmHg (02/17 0544) SpO2:  [94 %-98 %] 94 % (02/17 0544) Last BM Date: 08/22/11  Intake/Output from previous day: 02/16 0701 - 02/17 0700 In: 2700 [I.V.:2400; IV Piggyback:300] Out: 550 [Urine:550] Intake/Output this shift:    Sleeping, but arousable. Alert and oriented x3 once awake. Abd soft, nondistended.  Still with localized tenderness in LLQ and suprapubic location.  Less than yesterday, but still reasonable amount.    Lab Results:   [facility] 08/26/11 0610 08/25/11 0618  WBC 10.3 16.4*  HGB 11.2* 12.1  HCT 32.2* 34.9*  PLT 192 207   BMET  Basename 08/26/11 0610 08/25/11 0618  NA 138 138  K 3.6 3.2*  CL 105 103  CO2 26 25  GLUCOSE 117* 95  BUN 12 12  CREATININE 0.69 0.75  CALCIUM 9.1 9.4   PT/INR No results found for this basename: LABPROT:2,INR:2 in the last 72 hours ABG No results found for this basename: PHART:2,PCO2:2,PO2:2,HCO3:2 in the last 72 hours  Studies/Results: Ct Abdomen Pelvis W Contrast  08/24/2011  *RADIOLOGY REPORT*  Clinical Data: Right-sided abdominal pain and fever.  CT ABDOMEN AND PELVIS WITH CONTRAST  Technique:  Multidetector CT imaging of the abdomen and pelvis was performed following the standard protocol during bolus administration of intravenous contrast.  Contrast: OMNIPAQUE IOHEXOL 300 MG/ML IV SOLN  Comparison: None.  Findings: The visualized lung bases are clear.  The liver and spleen are unremarkable in appearance.  The gallbladder is within normal limits.  The pancreas and adrenal glands are unremarkable.  The kidneys are unremarkable in  appearance.  There is no evidence of hydronephrosis.  No renal or ureteral stones are seen.  No perinephric stranding is appreciated.  No free fluid is identified.  The small bowel is unremarkable in appearance.  The stomach is within normal limits.  No acute vascular abnormalities are seen.  Scattered calcification is noted along the distal abdominal aorta and its branches.  The appendix is normal in caliber, partially filled with contrast, and containing air, without evidence for appendicitis.  There is focal soft tissue inflammation noted at the mid pelvis adjacent to the mid sigmoid colon, with associated inflamed poorly characterized diverticula, and scattered minimal soft tissue air thought to reflect a microperforation.  This is concerning for acute diverticulitis.  No significant free fluid is seen; there is no evidence for abscess formation.  Contrast progresses to this point in the colon; there is minimal diverticulosis along the distal descending colon.  The bladder is mildly distended and grossly unremarkable in appearance.  The patient is status post hysterectomy; no suspicious adnexal masses are seen.  The ovaries appear relatively symmetric. No inguinal lymphadenopathy is seen.  No acute osseous abnormalities are identified.  IMPRESSION:  1.  Focal diverticulitis noted at the mid sigmoid colon, with soft tissue inflammation and likely microperforation.  No evidence of significant bowel perforation or abscess formation.  No free fluid seen.  2.  No evidence of appendicitis. 3.  Scattered calcification along the distal abdominal aorta and its branches.  Original Report  Authenticated By: Tonia Ghent, M.D.    Anti-infectives: Anti-infectives     Start     Dose/Rate Route Frequency Ordered Stop   08/24/11 2300   cefTRIAXone (ROCEPHIN) 1 g in dextrose 5 % 50 mL IVPB        1 g 100 mL/hr over 30 Minutes Intravenous Every 24 hours 08/24/11 2251     08/24/11 2245   metroNIDAZOLE (FLAGYL) IVPB 500  mg        500 mg 100 mL/hr over 60 Minutes Intravenous Every 8 hours 08/24/11 2251            Assessment/Plan: s/p * No surgery found * Diverticulitis with microperforation Continue nonoperative management.   Sips of clears today.  Would not advance rapidly as pt still is tender.   IV antibiotics Will follow.    LOS: 2 days    [facility] 08/26/2011

## 2011-08-27 LAB — BASIC METABOLIC PANEL
BUN: 7 mg/dL (ref 6–23)
Chloride: 108 mEq/L (ref 96–112)
Creatinine, Ser: 0.65 mg/dL (ref 0.50–1.10)
GFR calc non Af Amer: >90 mL/min (ref >90)
Glucose, Bld: 110 mg/dL — ABNORMAL HIGH (ref 70–99)
Potassium: 3.8 mEq/L (ref 3.5–5.1)

## 2011-08-27 LAB — CBC
HCT: 30.8 % — ABNORMAL LOW (ref 36.0–46.0)
Hemoglobin: 10.6 g/dL — ABNORMAL LOW (ref 12.0–15.0)
MCHC: 34.4 g/dL (ref 30.0–36.0)
MCV: 93.1 fL (ref 78.0–100.0)

## 2011-08-27 MED ORDER — POTASSIUM CHLORIDE IN NACL 20-0.45 MEQ/L-% IV SOLN
INTRAVENOUS | Status: DC
  Administered 2011-08-27: 1000 mL via INTRAVENOUS
  Administered 2011-08-28 – 2011-08-29 (×3): via INTRAVENOUS
  Filled 2011-08-27 (×7): qty 1000

## 2011-08-27 NOTE — Progress Notes (Signed)
Addended by: Margorie John on: 08/27/2011 02:57 PM   Modules accepted: Orders

## 2011-08-27 NOTE — Progress Notes (Addendum)
Subjective: More pain today after a full day of clear yesterday.  Pain mostly when examined, not at rest, but definitely worse than yesterday per pt.  Objective: Vital signs in last 24 hours: Filed Vitals:   08/26/11 0544 08/26/11 1500 08/26/11 2154 08/27/11 0541  BP: 111/75 115/75 116/82 109/73  Pulse: 73 77 61 63  Temp: 98.2 F (36.8 C) 98.6 F (37 C) 98.4 F (36.9 C) 98.3 F (36.8 C)  TempSrc: Oral  Oral Oral  Resp: 16 18 16 16   Height:      Weight:      SpO2: 94% 99% 99% 95%   Weight change:   Intake/Output Summary (Last 24 hours) at 08/27/11 1035 Last data filed at 08/27/11 0700  Gross per 24 hour  Intake   1320 ml  Output      0 ml  Net   1320 ml   Physical Exam: Gen: NAD, resting comfortably Abd: suprapubic TTP, no R/G, nondistended  Lab Results: Basic Metabolic Panel:  Lab 08/27/11 4540 08/26/11 0610  NA 139 138  K 3.8 3.6  CL 108 105  CO2 24 26  GLUCOSE 110* 117*  BUN 7 12  CREATININE 0.65 0.69  CALCIUM 9.2 9.1  MG -- --  PHOS -- --   Liver Function Tests:  Lab 08/24/11 1623  AST 21  ALT 16  ALKPHOS 104  BILITOT 0.5  PROT 8.1  ALBUMIN 3.7    Lab 08/24/11 1623  LIPASE 23  AMYLASE --   CBC:  Lab 08/27/11 0525 08/26/11 0610 08/24/11 1623  WBC 7.8 10.3 --  NEUTROABS -- -- 16.1*  HGB 10.6* 11.2* --  HCT 30.8* 32.2* --  MCV 93.1 93.6 --  PLT 231 192 --   Urinalysis:  Lab 08/24/11 2106  COLORURINE YELLOW  LABSPEC 1.006  PHURINE 6.0  GLUCOSEU NEGATIVE  HGBUR NEGATIVE  BILIRUBINUR NEGATIVE  KETONESUR NEGATIVE  PROTEINUR NEGATIVE  UROBILINOGEN 0.2  NITRITE NEGATIVE  LEUKOCYTESUR NEGATIVE    Studies/Results: No results found. Medications: I have reviewed the patient's current medications. Scheduled Meds:    . antiseptic oral rinse  15 mL Mouth Rinse q12n4p  . cefTRIAXone (ROCEPHIN)  IV  1 g Intravenous Q24H  . chlorhexidine  15 mL Mouth Rinse BID  . metronidazole  500 mg Intravenous Q8H   Continuous Infusions:    .  0.45 % NaCl with KCl 20 mEq / L 1,000 mL (08/27/11 0909)  . dextrose 5 % and 0.45 % NaCl with KCl 20 mEq/L 100 mL/hr at 08/26/11 0247   PRN Meds:.acetaminophen, acetaminophen, docusate sodium, hydrALAZINE, HYDROcodone-acetaminophen, morphine, ondansetron (ZOFRAN) IV, ondansetron  Assessment/Plan: # Diverticulitis Pt started clears yesterday and had full chicken broth with three meals, ice pop, and drinks.  Now today has increased pain on exam with some crampy gassy feeling.  Afebrile, WBC still trending down.  Hopeful that pain is just too much PO too quickly and not abscess formation.  No repeat CT, but will monitor closely. - appreciate surgery following - ceftriaxone 1g q24 - metronidazole 500mg  TID - con't clears, but with caution - Pain medicines as needed  - zofran as needed  - IVF  # Anemia Hb has trended down this hospitalization.  Could be 2/2 dilution and repeat draws, but pt has h/o severe anemia that has been controlled since her hysterectomy. - anemia panel tomorrow a.m.  # Hypertension  - Was controlled on HCTZ only, and was controlled in clinic today.  - Hold HCTZ  -  hydralazine PRN   # Hypokalemia Repleted   LOS: 3 days   Abner Greenspan, BEN 08/27/2011, 10:35 AM Internal Medicine Teaching Service Attending Note Date: 08/27/2011  Patient name: [name]  Medical record number: [id number]  Date of birth: [birth year]    This patient has been seen and discussed with the house staff. Please see their note for complete details. I concur with their findings with the following additions/corrections: per surgery recs, will continue on clears to see if her symptoms abate vs worsening which would need repeat abd ct for further evaluate to r/o abscess.  Judyann Munson 08/27/2011, 4:56 PM

## 2011-08-27 NOTE — Progress Notes (Signed)
Utilization review complete 

## 2011-08-27 NOTE — Progress Notes (Signed)
  Subjective: Had a great day yesterday but not feeling so good this am. C/o crampy, gassy pains. Had 2 BMs yesterday and passing flatus. Mild nausea but sipping on clears.  Objective: Vital signs in last 24 hours: Temp:  [98.3 F (36.8 C)-98.6 F (37 C)] 98.3 F (36.8 C) (02/18 0541) Pulse Rate:  [61-77] 63  (02/18 0541) Resp:  [16-18] 16  (02/18 0541) BP: (109-116)/(73-82) 109/73 mmHg (02/18 0541) SpO2:  [95 %-99 %] 95 % (02/18 0541) Last BM Date: 08/26/11  Intake/Output this shift:    Physical Exam: BP 109/73  Pulse 63  Temp(Src) 98.3 F (36.8 C) (Oral)  Resp 16  Ht 5\' 7"  (1.702 m)  Wt 90.266 kg (199 lb)  BMI 31.17 kg/m2  SpO2 95% Abdomen: soft, ND Tender LLQ, no peritoneal signs. Ext, NT, no edema  Labs: CBC  Basename 08/27/11 0525 08/26/11 0610  WBC 7.8 10.3  HGB 10.6* 11.2*  HCT 30.8* 32.2*  PLT 231 192   BMET  Basename 08/27/11 0525 08/26/11 0610  NA 139 138  K 3.8 3.6  CL 108 105  CO2 24 26  GLUCOSE 110* 117*  BUN 7 12  CREATININE 0.65 0.69  CALCIUM 9.2 9.1   LFT  Basename 08/24/11 1623  PROT 8.1  ALBUMIN 3.7  AST 21  ALT 16  ALKPHOS 104  BILITOT 0.5  BILIDIR --  IBILI --  LIPASE 23   PT/INR No results found for this basename: LABPROT:2,INR:2 in the last 72 hours ABG No results found for this basename: PHART:2,PCO2:2,PO2:2,HCO3:2 in the last 72 hours  Studies/Results: No results found.  Assessment: Principal Problem:  *Sigmoid diverticulitis with microperforation Active Problems:  Hypertension     Plan: Given continued pain, keep on IV abx and clears only. WBC and temp remain normal. Restart MIVF If she does not improve, will consider repeat CT to look for abscess formation.  LOS: 3 days    Alyse Low 08/27/2011 7:48 AM  She feels much better this afternoon than this am.  She is tolerating clears and abdomen pain improved.  Will wait for resolution of symptoms prior to advancing diet.

## 2011-08-28 LAB — CBC
HCT: 30.8 % — ABNORMAL LOW (ref 36.0–46.0)
Hemoglobin: 10.6 g/dL — ABNORMAL LOW (ref 12.0–15.0)
MCH: 32.1 pg (ref 26.0–34.0)
MCHC: 34.4 g/dL (ref 30.0–36.0)
RBC: 3.3 MIL/uL — ABNORMAL LOW (ref 3.87–5.11)

## 2011-08-28 LAB — RETICULOCYTES
RBC.: 3.3 MIL/uL — ABNORMAL LOW (ref 3.87–5.11)
Retic Ct Pct: 1.5 % (ref 0.4–3.1)

## 2011-08-28 LAB — IRON AND TIBC
Saturation Ratios: 28 % (ref 20–55)
TIBC: 236 ug/dL — ABNORMAL LOW (ref 250–470)
UIBC: 171 ug/dL (ref 125–400)

## 2011-08-28 MED ORDER — METRONIDAZOLE 500 MG PO TABS
500.0000 mg | ORAL_TABLET | Freq: Three times a day (TID) | ORAL | Status: DC
  Administered 2011-08-28 – 2011-08-29 (×4): 500 mg via ORAL
  Filled 2011-08-28 (×6): qty 1

## 2011-08-28 MED ORDER — CIPROFLOXACIN HCL 500 MG PO TABS
500.0000 mg | ORAL_TABLET | Freq: Two times a day (BID) | ORAL | Status: DC
  Administered 2011-08-28 – 2011-08-29 (×3): 500 mg via ORAL
  Filled 2011-08-28 (×5): qty 1

## 2011-08-28 NOTE — Progress Notes (Addendum)
Subjective: Pain much improved today after taking it slow with clear yesterday.  Objective: Vital signs in last 24 hours: Filed Vitals:   08/27/11 0541 08/27/11 1458 08/27/11 2118 08/28/11 0547  BP: 109/73 123/81 125/83 117/74  Pulse: 63 60 71 87  Temp: 98.3 F (36.8 C) 97.7 F (36.5 C) 98 F (36.7 C) 97.1 F (36.2 C)  TempSrc: Oral Oral Oral Oral  Resp: 16 18 18 17   Height:      Weight:      SpO2: 95% 98% 96% 94%   Weight change:   Intake/Output Summary (Last 24 hours) at 08/28/11 0845 Last data filed at 08/27/11 1900  Gross per 24 hour  Intake   1320 ml  Output      0 ml  Net   1320 ml   Physical Exam: Gen: NAD, resting comfortably CV: RRR Chest: CTAB Abd: soft, NT/ND, NABS  Lab Results: Basic Metabolic Panel:  Lab 08/27/11 1610 08/26/11 0610  NA 139 138  K 3.8 3.6  CL 108 105  CO2 24 26  GLUCOSE 110* 117*  BUN 7 12  CREATININE 0.65 0.69  CALCIUM 9.2 9.1  MG -- --  PHOS -- --   Liver Function Tests:  Lab 08/24/11 1623  AST 21  ALT 16  ALKPHOS 104  BILITOT 0.5  PROT 8.1  ALBUMIN 3.7    Lab 08/24/11 1623  LIPASE 23  AMYLASE --   CBC:  Lab 08/28/11 0624 08/27/11 0525 08/24/11 1623  WBC 7.5 7.8 --  NEUTROABS -- -- 16.1*  HGB 10.6* 10.6* --  HCT 30.8* 30.8* --  MCV 93.3 93.1 --  PLT 255 231 --   Urinalysis:  Lab 08/24/11 2106  COLORURINE YELLOW  LABSPEC 1.006  PHURINE 6.0  GLUCOSEU NEGATIVE  HGBUR NEGATIVE  BILIRUBINUR NEGATIVE  KETONESUR NEGATIVE  PROTEINUR NEGATIVE  UROBILINOGEN 0.2  NITRITE NEGATIVE  LEUKOCYTESUR NEGATIVE    Studies/Results: No results found. Medications: I have reviewed the patient's current medications. Scheduled Meds:    . cefTRIAXone (ROCEPHIN)  IV  1 g Intravenous Q24H  . metronidazole  500 mg Intravenous Q8H  . DISCONTD: antiseptic oral rinse  15 mL Mouth Rinse q12n4p  . DISCONTD: chlorhexidine  15 mL Mouth Rinse BID   Continuous Infusions:    . 0.45 % NaCl with KCl 20 mEq / L 75 mL/hr at  08/28/11 0028   PRN Meds:.acetaminophen, acetaminophen, docusate sodium, hydrALAZINE, HYDROcodone-acetaminophen, morphine, ondansetron (ZOFRAN) IV, ondansetron  Assessment/Plan: # Diverticulitis Abd pain improved from yesterday after going slower with clear foods.  Afebrile, WBC wnl.  Passing flatus, + BM yesterday.  Defer to surgery judgement on advancing diet, abx. - appreciate surgery following - ceftriaxone 1g q24 - metronidazole 500mg  TID - con't clears, consider soft diet per surgery - Pain medicines as needed  - zofran as needed  - IVF  # Anemia Stable today, but Hb has trended down this hospitalization.  Could be 2/2 dilution and repeat draws, but pt has h/o severe anemia that has been controlled since her hysterectomy. - anemia panel still pending  # Hypertension  - Was controlled on HCTZ only, and was controlled in clinic today.  - Hold HCTZ  - hydralazine PRN   # Hypokalemia Repleted   LOS: 4 days   Abner Greenspan, BEN 08/28/2011, 8:45 AM  Internal Medicine Teaching Service Attending Note Date: 08/28/2011  Patient name: [name]  Medical record number: [id number]  Date of birth: 1965-10-24    This patient has  been seen and discussed with the house staff. Please see their note for complete details. I concur with their findings with the following additions/corrections: patient is doing well, and per surgery recs, will advance diet beyond clears. Will switch antibiotics to oral regimen of cipro 500mg  BID and metronidazole 500mg  TID. Judyann Munson 08/28/2011, 3:40 PM

## 2011-08-28 NOTE — Progress Notes (Signed)
Subjective: Feels allot better today.  Pain much improved.  PO intake more modest yesterday.  Moving well. +Flatus  Objective: Vital signs in last 24 hours: Temp:  [97.1 F (36.2 C)-98 F (36.7 C)] 97.1 F (36.2 C) (02/19 0547) Pulse Rate:  [60-87] 87  (02/19 0547) Resp:  [17-18] 17  (02/19 0547) BP: (117-125)/(74-83) 117/74 mmHg (02/19 0547) SpO2:  [94 %-98 %] 94 % (02/19 0547) Last BM Date: 08/26/11  Intake/Output from previous day: 02/18 0701 - 02/19 0700 In: 1320 [P.O.:1320] Out: -  Intake/Output this shift:    PE; Alert, Moves well in bed, Chest:  Clear,  Abd:  Soft, pain has been periumbilical area, much improved. +BS, + flatus.   Lab Results:   [facility] 08/28/11 0624 08/27/11 0525  WBC 7.5 7.8  HGB 10.6* 10.6*  HCT 30.8* 30.8*  PLT 255 231    BMET  Basename 08/27/11 0525 08/26/11 0610  NA 139 138  K 3.8 3.6  CL 108 105  CO2 24 26  GLUCOSE 110* 117*  BUN 7 12  CREATININE 0.65 0.69  CALCIUM 9.2 9.1   PT/INR No results found for this basename: LABPROT:2,INR:2 in the last 72 hours   Studies/Results: No results found.  Anti-infectives: Anti-infectives     Start     Dose/Rate Route Frequency Ordered Stop   08/24/11 2300   cefTRIAXone (ROCEPHIN) 1 g in dextrose 5 % 50 mL IVPB        1 g 100 mL/hr over 30 Minutes Intravenous Every 24 hours 08/24/11 2251     08/24/11 2245   metroNIDAZOLE (FLAGYL) IVPB 500 mg        500 mg 100 mL/hr over 60 Minutes Intravenous Every 8 hours 08/24/11 2251           Current Facility-Administered Medications  Medication Dose Route Frequency Provider Last Rate Last Dose  . 0.45 % NaCl with KCl 20 mEq / L infusion   Intravenous Continuous Marianna Fuss, PA 75 mL/hr at 08/28/11 0028    . acetaminophen (TYLENOL) tablet 650 mg  650 mg Oral Q6H PRN Lars Mage, MD       Or  . acetaminophen (TYLENOL) suppository 650 mg  650 mg Rectal Q6H PRN Lars Mage, MD      . cefTRIAXone (ROCEPHIN) 1 g in dextrose 5 % 50 mL IVPB  1  g Intravenous Q24H Na Li, MD   1 g at 08/27/11 2305  . docusate sodium (COLACE) capsule 100 mg  100 mg Oral BID PRN Lars Mage, MD      . hydrALAZINE (APRESOLINE) injection 10 mg  10 mg Intravenous Q6H PRN Kathreen Cosier, MD      . HYDROcodone-acetaminophen (NORCO) 5-325 MG per tablet 1-2 tablet  1-2 tablet Oral Q4H PRN Lars Mage, MD      . metroNIDAZOLE (FLAGYL) IVPB 500 mg  500 mg Intravenous Q8H Na Li, MD   500 mg at 08/28/11 1914  . morphine 2 MG/ML injection 2 mg  2 mg Intravenous Q4H PRN Lars Mage, MD      . ondansetron (ZOFRAN) tablet 4 mg  4 mg Oral Q6H PRN Lars Mage, MD       Or  . ondansetron (ZOFRAN) injection 4 mg  4 mg Intravenous Q6H PRN Lars Mage, MD      . DISCONTD: antiseptic oral rinse (BIOTENE) solution 15 mL  15 mL Mouth Rinse q12n4p Na Li, MD   15 mL at 08/26/11 1151  . DISCONTD: chlorhexidine (  PERIDEX) 0.12 % solution 15 mL  15 mL Mouth Rinse BID Na Li, MD   15 mL at 08/27/11 0746    Assessment/Plan *Sigmoid diverticulitis with microperforation  Active Problems:  Hypertension   Plan: Afebrile, WBC 7500, less tender.  Will leave on clears for now, starting day 5 of antibiotics. Discussed with DR. Biagio Quint, go to low residual diet, and oral antibiotics.      LOS: 4 days    [name],[name] 08/28/2011 Pain free.  No abdominal tenderness.  Advance diet and home soon with oral abx if she continues to do well.

## 2011-08-29 DIAGNOSIS — K5732 Diverticulitis of large intestine without perforation or abscess without bleeding: Principal | ICD-10-CM

## 2011-08-29 LAB — CBC
Hemoglobin: 10.8 g/dL — ABNORMAL LOW (ref 12.0–15.0)
MCH: 32 pg (ref 26.0–34.0)
MCHC: 34.1 g/dL (ref 30.0–36.0)
MCV: 94.1 fL (ref 78.0–100.0)

## 2011-08-29 MED ORDER — POLYETHYLENE GLYCOL 3350 17 G PO PACK
17.0000 g | PACK | Freq: Every day | ORAL | Status: DC | PRN
  Filled 2011-08-29: qty 1

## 2011-08-29 MED ORDER — METRONIDAZOLE 500 MG PO TABS: 500.0000 mg | ORAL_TABLET | Freq: Three times a day (TID) | ORAL | Status: AC

## 2011-08-29 MED ORDER — CIPROFLOXACIN HCL 500 MG PO TABS: 500.0000 mg | ORAL_TABLET | Freq: Two times a day (BID) | ORAL | Status: AC

## 2011-08-29 NOTE — Progress Notes (Signed)
Subjective: Afebrile, converted to oral cipro, and flagyl yesterday, WBC is normal.  She looks and feels great, no residual tenderness in abd.  Tolerating low residual diet well, and asking questions.  Objective: Vital signs in last 24 hours: Temp:  [97.3 F (36.3 C)-97.9 F (36.6 C)] 97.3 F (36.3 C) (02/20 0607) Pulse Rate:  [66-75] 75  (02/20 0607) Resp:  [18] 18  (02/20 0607) BP: (104-112)/(67-76) 110/75 mmHg (02/20 0607) SpO2:  [94 %-97 %] 97 % (02/20 0607) Last BM Date: 08/28/11  Intake/Output from previous day: 02/19 0701 - 02/20 0700 In: 3873.8 [P.O.:960; I.V.:2913.8] Out: -  Intake/Output this shift:    PE:  Alert, up in bed full breakfast, ABD:  Soft, non tender, +BS, BM yesterday.  Lab Results:   Basename 08/29/11 0517 08/28/11 0624  WBC 6.5 7.5  HGB 10.8* 10.6*  HCT 31.7* 30.8*  PLT 270 255    BMET  Basename 08/27/11 0525  NA 139  K 3.8  CL 108  CO2 24  GLUCOSE 110*  BUN 7  CREATININE 0.65  CALCIUM 9.2   PT/INR No results found for this basename: LABPROT:2,INR:2 in the last 72 hours   Studies/Results: No results found.  Anti-infectives: Anti-infectives     Start     Dose/Rate Route Frequency Ordered Stop   08/28/11 1400   metroNIDAZOLE (FLAGYL) tablet 500 mg        500 mg Oral 3 times per day 08/28/11 0919     08/28/11 1000   ciprofloxacin (CIPRO) tablet 500 mg        500 mg Oral 2 times daily 08/28/11 0919     08/24/11 2300   cefTRIAXone (ROCEPHIN) 1 g in dextrose 5 % 50 mL IVPB  Status:  Discontinued        1 g 100 mL/hr over 30 Minutes Intravenous Every 24 hours 08/24/11 2251 08/28/11 0919   08/24/11 2245   metroNIDAZOLE (FLAGYL) IVPB 500 mg  Status:  Discontinued        500 mg 100 mL/hr over 60 Minutes Intravenous Every 8 hours 08/24/11 2251 08/28/11 0919         Current Facility-Administered Medications  Medication Dose Route Frequency Provider Last Rate Last Dose  . 0.45 % NaCl with KCl 20 mEq / L infusion   Intravenous  Continuous Marianna Fuss, PA 75 mL/hr at 08/29/11 0402    . acetaminophen (TYLENOL) tablet 650 mg  650 mg Oral Q6H PRN Lars Mage, MD       Or  . acetaminophen (TYLENOL) suppository 650 mg  650 mg Rectal Q6H PRN Lars Mage, MD      . ciprofloxacin (CIPRO) tablet 500 mg  500 mg Oral BID Sherrie George, PA   500 mg at 08/29/11 0742  . docusate sodium (COLACE) capsule 100 mg  100 mg Oral BID PRN Lars Mage, MD      . hydrALAZINE (APRESOLINE) injection 10 mg  10 mg Intravenous Q6H PRN Kathreen Cosier, MD      . HYDROcodone-acetaminophen (NORCO) 5-325 MG per tablet 1-2 tablet  1-2 tablet Oral Q4H PRN Lars Mage, MD      . metroNIDAZOLE (FLAGYL) tablet 500 mg  500 mg Oral Q8H Sherrie George, [place]   500 mg at 08/29/11 0542  . morphine 2 MG/ML injection 2 mg  2 mg Intravenous Q4H PRN Lars Mage, MD      . ondansetron (ZOFRAN) tablet 4 mg  4 mg Oral Q6H PRN Lars Mage, MD  Or  . ondansetron (ZOFRAN) injection 4 mg  4 mg Intravenous Q6H PRN Lars Mage, MD      . DISCONTD: cefTRIAXone (ROCEPHIN) 1 g in dextrose 5 % 50 mL IVPB  1 g Intravenous Q24H Na Li, MD   1 g at 08/27/11 2305  . DISCONTD: metroNIDAZOLE (FLAGYL) IVPB 500 mg  500 mg Intravenous Q8H Na Li, MD   500 mg at 08/28/11 1610    Assessment/Plan Sigmoid diverticulitis with microperforation  Active Problems:  Hypertension  Plan:  Low residual diet and antibiotics 2 week.  Then high fiber, no constipation.  Follow up with primary care and GI evaluation for colonoscopy in future.  She can follow up with Dr. Biagio Quint as needed.    LOS: 5 days    [name],[name] 08/29/2011  She feels well.  Pain free.  Abdomen is nontender.  Okay for discharge to home and f/u [facility] prn

## 2011-08-29 NOTE — Discharge Summary (Signed)
Internal Medicine Teaching [facility] Discharge Note  Name: [name] MRN: [id number] DOB: 06-08-1966 46 y.o.  Date of Admission: 08/24/2011  6:14 PM Date of Discharge: 08/29/2011 Attending Physician: Judyann Munson, MD  Discharge Diagnosis: Principal Problem:  *Sigmoid diverticulitis with microperforation Active Problems:  Hypertension  Discharge Medications: Medication List  As of 08/29/2011  2:06 PM   STOP taking these medications         hydrochlorothiazide 12.5 MG capsule      ibuprofen 800 MG tablet         TAKE these medications         ciprofloxacin 500 MG tablet   Commonly known as: CIPRO   Take 1 tablet (500 mg total) by mouth 2 (two) times daily. For 10 days.      metroNIDAZOLE 500 MG tablet   Commonly known as: FLAGYL   Take 1 tablet (500 mg total) by mouth every 8 (eight) hours. For 10 days      mulitivitamin with minerals Tabs   Take 1 tablet by mouth daily.            Disposition and follow-up:   [name] was discharged from [facility] in Stable condition.    Follow-up Appointments: PMD: 1. Please ensure pt has no further abdominal pain and that she finished her abx.  Pt will need f/u colonoscopy in 6-8 weeks from d/c for diverticulitis.  The pt was instructed to call [facility] to arrange.  Please ensure this is arranged or in process. 2. Her BP was well controlled with no meds, and she would like to continue off any anti-htn. 3. Can recheck CBC to make sure mild anemia has resolved.  Anemia panel was unremarkable in admission.  Follow-up Information    Follow up with Atilano Ina, MD,FACG. (As needed)    Contact information:   [employer] [address] [place] [address] [phone]       Follow up with Margorie John, MD on 09/07/2011. (10:15am)    Contact information:   [address] [place] [address] [phone]       Schedule an appointment as soon as possible for a visit with [facility]. (Need colonoscopy in 6-8 weeks for follow up)    Contact information:   [address] [place] [address] [phone]        Discharge Orders    Future Appointments: Provider: Department: Dept Phone: Center:   09/07/2011 10:15 AM Margorie John, MD [facility] [phone] [facility]     Future Orders Please Complete By Expires   Increase activity slowly      Discharge instructions      Comments:   Please adhere to the discussed diet: low fiber for two weeks then high fiber thereafter.      Consultations:    Procedures Performed:  Ct Abdomen Pelvis W Contrast  08/24/2011  *RADIOLOGY REPORT*  Clinical Data: Right-sided abdominal pain and fever.  CT ABDOMEN AND PELVIS WITH CONTRAST  Technique:  Multidetector CT imaging of the abdomen and pelvis was performed following the standard protocol during bolus administration of intravenous contrast.  Contrast: OMNIPAQUE IOHEXOL 300 MG/ML IV SOLN  Comparison: None.  Findings: The visualized lung bases are clear.  The liver and spleen are unremarkable in appearance.  The gallbladder is within normal limits.  The pancreas and adrenal glands are unremarkable.  The kidneys are unremarkable  in appearance.  There is no evidence of hydronephrosis.  No renal or ureteral stones are seen.  No perinephric stranding is appreciated.  No free fluid is identified.  The small bowel is unremarkable in appearance.  The stomach is within normal limits.  No acute vascular abnormalities are seen.  Scattered calcification is noted along the distal abdominal aorta and its branches.  The appendix is normal in caliber, partially filled with contrast, and containing air, without evidence for appendicitis.  There is focal soft tissue inflammation noted at the mid pelvis adjacent to the mid sigmoid colon, with associated inflamed poorly characterized diverticula, and scattered  minimal soft tissue air thought to reflect a microperforation.  This is concerning for acute diverticulitis.  No significant free fluid is seen; there is no evidence for abscess formation.  Contrast progresses to this point in the colon; there is minimal diverticulosis along the distal descending colon.  The bladder is mildly distended and grossly unremarkable in appearance.  The patient is status post hysterectomy; no suspicious adnexal masses are seen.  The ovaries appear relatively symmetric. No inguinal lymphadenopathy is seen.  No acute osseous abnormalities are identified.  IMPRESSION:  1.  Focal diverticulitis noted at the mid sigmoid colon, with soft tissue inflammation and likely microperforation.  No evidence of significant bowel perforation or abscess formation.  No free fluid seen.  2.  No evidence of appendicitis. 3.  Scattered calcification along the distal abdominal aorta and its branches.  Original Report Authenticated By: Tonia Ghent, M.D.   Admission HPI: [name] is a 46 y.o. F with a h/o HTN who p/w abdominal pain. The pt felt great until yesterday morning when she began experiencing some lower abdominal pain. Her abdominal pain is a crampy pressure that is relieved by passing occasional gas and is constant in nature. She has not had a stool in 3 days. At work she started feeling very cold and had a temp of102.5. She took ibuprofen with relief to 99. Has had nausea but no vomiting, also had anorexia but has been able to eat a little today.  No URI symptoms, passing gas, no dysuria or urinary frequency. No melena or hematochezia. Had umbilical surgery as child and is s/p hysterectomy last year.   Hospital Course by problem list: 1. Sigmoid diverticulitis with microperforation Pt found to have sigmoid diverticulitis with microperforation on CT scan.  General surgery was consulted, who advised nonoperative management.  Pt was started on IV ceftriaxone and IV flagyl, with good  improvement in pain and decline in WBC count.  After 5 days she had progressed from NPO to clears to soft mech, and remained afebrile with a normal white count.  Pt was d/c'ed on PO cipro and flagyl for a total of 14 days abx.  She was advised to have 14 days of a low fiber diet then to move to a high fiber diet to avoid constipation.  She will f/u with her PMD on 3/1 and will have a colonoscopy for f/u in 6-8 weeks.  She can f/u with surgery only as needed.  Hopefully this will not be a recurrent problem for this pt, but we did discuss the chance that this could recur or that an abscess or other complication could still occur after d/c.  2. Anemia: pt was mildly anemic in house, with an unremarkable anemia panel.  Likely 2/2 diluation and blood draws, but will repeat CBC as an outpatient.  3. HTN Pt BP was well controlled  without any antihypertensive medicine.  Discharge Vitals:  BP 110/75  Pulse 75  Temp(Src) 97.3 F (36.3 C) (Oral)  Resp 18  Ht 5\' 7"  (1.702 m)  Wt 199 lb (90.266 kg)  BMI 31.17 kg/m2  SpO2 97%  Discharge Labs:  Results for orders placed during the hospital encounter of 08/24/11 (from the past 24 hour(s))  CBC     Status: Abnormal   Collection Time   08/29/11  5:17 AM      Component Value Range   WBC 6.5  4.0 - 10.5 (K/uL)   RBC 3.37 (*) 3.87 - 5.11 (MIL/uL)   Hemoglobin 10.8 (*) 12.0 - 15.0 (g/dL)   HCT 04.5 (*) 40.9 - 46.0 (%)   MCV 94.1  78.0 - 100.0 (fL)   MCH 32.0  26.0 - 34.0 (pg)   MCHC 34.1  30.0 - 36.0 (g/dL)   RDW 81.1  91.4 - 78.2 (%)   Platelets 270  150 - 400 (K/uL)    SignedAbner Greenspan, BEN 08/29/2011, 2:06 PM

## 2011-08-29 NOTE — Plan of Care (Signed)
Problem: Food- and Nutrition-Related Knowledge Deficit (NB-1.1) Goal: Nutrition education Formal process to instruct or train a patient/client in a skill or to impart knowledge to help patients/clients voluntarily manage or modify food choices and eating behavior to maintain or improve health.  Outcome: Completed/Met Date Met:  08/29/11 Patient was seen today for low fiber and high fiber diet education. Patient was very concerned about how she was going to eat after D/C. RD provided education for low fiber diet for ~2 weeks post D/C and then transition over to a high fiber diet after that. Patient was given [employer] handouts for fiber. Patient had no additional questions. Chart reviewed, no additional nutrition interventions at this time. RD provided patient with contact information.   [name] MARIE # [phone]

## 2011-08-29 NOTE — Discharge Summary (Signed)
Internal Medicine Teaching Service Attending Note Date: 08/29/2011  Patient name: [name]  Medical record number: [id number]  Date of birth: 1966-02-01    This patient has been seen and discussed with the house staff. Please see their note for complete details. I concur with their findings with the following additions/corrections: [name] is anticipated to be discharged home today to finish out a 2 wk course of antibiotics ( including the days she has received them while hospitalized), with cipro 500mg  BID and metronidazole 500mg  TID. To follow up in resident's clinic within the next 2 wks.  Judyann Munson 08/29/2011, 2:40 PM

## 2011-08-29 NOTE — Discharge Instructions (Signed)
Low Fiber and Residue Restricted Diet A low fiber diet restricts foods that contain carbohydrates that are not digested in the small intestine. A diet containing about 10 g of fiber is considered low fiber. The diet needs to be individualized to suit patient tolerances and preferences and to avoid unnecessary restrictions. Generally, the foods emphasized in a low fiber diet have no skins or seeds. They may have been processed to remove bran, germ, or husks. Cooking may not necessarily eliminate the fiber. Cooking may, in fact, enable a greater quantity of fiber to be consumed in a lesser volume. Legumes and nuts are also restricted. The term low residue has also been used to describe low fiber diets, although the two are not the same. Residue refers to any substance that adds to bowel (colonic) contents, such as sloughed cells and intestinal bacteria, in addition to fiber. Residue-containing foods, prunes and prune juice, milk, and connective tissue from meats may also need to be eliminated. It is important to eliminate these foods during sudden (acute) attacks of inflammatory bowel disease, when there is a partial obstruction due to another reason, or when minimal fecal output is desired. When these problems are gone, a more normal diet may be used. PURPOSE  Prevent blockage of a partially obstructed or narrowed gastrointestinal tract.   Reduce stool weight and volume.   Slow the movement of waste.  WHEN IS THIS DIET USED?  Acute phase of Crohn's disease, ulcerative colitis, regional enteritis, or diverticulitis.   Narrowing (stenosis) of intestinal or esophageal tubes (lumina).   Transitional diet following surgery, injury (trauma), or illness.  ADEQUACY This diet is nutritionally adequate based on individual food choices according to the Recommended Dietary Allowances of the National Research Council. CHOOSING FOODS Check labels, especially on foods from the starch list. Often, dietary fiber  content is listed with the Nutrition Facts panel.  Breads and Starches  Allowed: White, French, and pita breads, plain rolls, buns, or sweet rolls, doughnuts, waffles, pancakes, bagels. Plain muffins, sweet breads, biscuits, matzoth. Flour. Soda, saltine, or graham crackers. Pretzels, rusks, melba toast, zwieback. Cooked cereals: cornmeal, farina, cream cereals. Dry cereals: refined corn, wheat, rice, and oat cereals (check label). Potatoes prepared any way without skins, refined macaroni, spaghetti, noodles, refined rice.   Avoid: Bread, rolls, or crackers made with whole-wheat, multigrains, rye, bran seeds, nuts, or coconut. Corn tortillas, table-shells. Corn chips, tortilla chips. Cereals containing whole-grains, multigrains, bran, coconut, nuts, or raisins. Cooked or dry oatmeal. Coarse wheat cereals, granola. Cereals advertised as "high fiber." Potato skins. Whole-grain pasta, wild or brown rice. Popcorn.  Vegetables  Allowed:  Strained tomato and vegetable juices. Fresh: tender lettuce, cucumber, cabbage, spinach, bean sprouts. Cooked, canned: asparagus, bean sprouts, cut green or wax beans, cauliflower, pumpkin, beets, mushrooms, olives, spinach, yellow squash, tomato, tomato sauce (no seeds), zucchini (peeled), turnips. Canned sweet potatoes. Small amounts of celery, onion, radish, and green pepper may be used. Keep servings limited to  cup.   Avoid: Fresh, cooked, or canned: artichokes, baked beans, beet greens, broccoli, Brussels sprouts, French-style green beans, corn, kale, legumes, peas, sweet potatoes. Cooked: green or red cabbage, spinach. Avoid large servings of any vegetables.  Fruit  Allowed:  All fruit juices except prune juice. Cooked or canned: apricots applesauce, cantaloupe, cherries, grapefruit, grapes, kiwi, mandarin oranges, peaches, pears, fruit cocktail, pineapple, plums, watermelon. Fresh: banana, grapes, cantaloupe, avocado, cherries, pineapple, grapefruit, kiwi,  nectarines, peaches, oranges, blueberries, plums. Keep servings limited to  cup or 1 piece.     Avoid: Fresh: apple with or without skin, apricots, mango, pears, raspberries, strawberries. Prune juice, stewed or dried prunes. Dried fruits, raisins, dates. Avoid large servings of all fresh fruits.  Meat and Meat Substitutes  Allowed:  Ground or well-cooked tender beef, ham, veal, lamb, pork, or poultry. Eggs, plain cheese. Fish, oysters, shrimp, lobster, other seafood. Liver, organ meats.   Avoid: Tough, fibrous meats with gristle. Peanut butter, smooth or chunky. Cheese with seeds, nuts, or other foods not allowed. Nuts, seeds, legumes, dried peas, beans, lentils.  Milk  Allowed:  All milk products except those not allowed. Milk and milk product consumption should be minimal when low residue is desired.   Avoid: Yogurt that contains nuts or seeds.  Soups and Combination Foods  Allowed:  Bouillon, broth, or cream soups made from allowed foods. Any strained soup. Casseroles or mixed dishes made with allowed foods.   Avoid: Soups made from vegetables that are not allowed or that contain other foods not allowed.  Desserts and Sweets  Allowed:  Plain cakes and cookies, pie made with allowed fruit, pudding, custard, cream pie. Gelatin, fruit, ice, sherbet, frozen ice pops. Ice cream, ice milk without nuts. Plain hard candy, honey, jelly, molasses, syrup, sugar, chocolate syrup, gumdrops, marshmallows.   Avoid: Desserts, cookies, or candies that contain nuts, peanut butter, or dried fruits. Jams, preserves with seeds, marmalade.  Fats and Oils  Allowed:  Margarine, butter, cream, mayonnaise, salad oils, plain salad dressings made from allowed foods. Plain gravy, crisp bacon without rind.   Avoid: Seeds, nuts, olives. Avocados.  Beverages  Allowed:  All, except those listed to avoid.   Avoid: Fruit juices with high pulp, prune juice.  Condiments  Allowed:  Ketchup, mustard, horseradish,  vinegar, cream sauce, cheese sauce, cocoa powder. Spices in moderation: allspice, basil, bay leaves, celery powder or leaves, cinnamon, cumin powder, curry powder, ginger, mace, marjoram, onion or garlic powder, oregano, paprika, parsley flakes, ground pepper, rosemary, sage, savory, tarragon, thyme, turmeric.   Avoid: Coconut, pickles.  SAMPLE MEAL PLAN The following menu is provided as a sample. Your daily menu plans will vary. Be sure to include a minimum of the following each day in order to provide essential nutrients for the adult:  Starch/Bread/Cereal Group, 6 servings.   Fruit/Vegetable Group, 5 servings.   Meat/Meat Substitute Group, 2 servings.   Milk/Milk Substitute Group, 2 servings.  A serving is equal to  cup for fruits, vegetables, and cooked cereals or 1 piece for foods such as a piece of bread, 1 orange, or 1 apple. For dry cereals and crackers, use serving sizes listed on the label. Combination foods may count as full or partial servings from various food groups. Fats, desserts, and sweets may be added to the meal plan after the requirements for essential nutrients are met. SAMPLE MENU Breakfast   cup orange juice.   1 boiled egg.   1 slice white toast.   Margarine.    cup cornflakes.   1 cup milk.   Beverage.  Lunch   cup chicken noodle soup.   2 to 3 oz sliced roast beef.   2 slices seedless rye bread.   Mayonnaise.    cup tomato juice.   1 small banana.   Beverage.  Dinner  3 oz baked chicken.    cup scalloped potatoes.    cup cooked beets.   White dinner roll.   Margarine.    cup canned peaches.   Beverage.  Document Released: 12/15/2001 Document Revised: 03/07/2011  Document Reviewed: 11/08/2008 [facility] Patient Information 2012 [place], [place].Diverticulitis A diverticulum is a small pouch or sac on the colon. Diverticulosis is the presence of these diverticula on the colon. Diverticulitis is the irritation (inflammation) or  infection of diverticula. CAUSES  The colon and its diverticula contain bacteria. If food particles block the tiny opening to a diverticulum, the bacteria inside can grow and cause an increase in pressure. This leads to infection and inflammation and is called diverticulitis. SYMPTOMS   Abdominal pain and tenderness. Usually, the pain is located on the left side of your abdomen. However, it could be located elsewhere.   Fever.   Bloating.   Feeling sick to your stomach (nausea).   Throwing up (vomiting).   Abnormal stools.  DIAGNOSIS  Your caregiver will take a history and perform a physical exam. Since many things can cause abdominal pain, other tests may be necessary. Tests may include:  Blood tests.   Urine tests.   X-ray of the abdomen.   CT scan of the abdomen.  Sometimes, surgery is needed to determine if diverticulitis or other conditions are causing your symptoms. TREATMENT  Most of the time, you can be treated without surgery. Treatment includes:  Resting the bowels by only having liquids for a few days. As you improve, you will need to eat a low-fiber diet.   Intravenous (IV) fluids if you are losing body fluids (dehydrated).   Antibiotic medicines that treat infections may be given.   Pain and nausea medicine, if needed.   Surgery if the inflamed diverticulum has burst.  HOME CARE INSTRUCTIONS   Try a clear liquid diet (broth, tea, or water for as long as directed by your caregiver). You may then gradually begin a low-fiber diet as tolerated. A low-fiber diet is a diet with less than 10 grams of fiber. Choose the foods below to reduce fiber in the diet:   White breads, cereals, rice, and pasta.   Cooked fruits and vegetables or soft fresh fruits and vegetables without the skin.   Ground or well-cooked tender beef, ham, veal, lamb, pork, or poultry.   Eggs and seafood.   After your diverticulitis symptoms have improved, your caregiver may put you on a  high-fiber diet. A high-fiber diet includes 14 grams of fiber for every 1000 calories consumed. For a standard 2000 calorie diet, you would need 28 grams of fiber. Follow these diet guidelines to help you increase the fiber in your diet. It is important to slowly increase the amount fiber in your diet to avoid gas, constipation, and bloating.   Choose whole-grain breads, cereals, pasta, and brown rice.   Choose fresh fruits and vegetables with the skin on. Do not overcook vegetables because the more vegetables are cooked, the more fiber is lost.   Choose more nuts, seeds, legumes, dried peas, beans, and lentils.   Look for food products that have greater than 3 grams of fiber per serving on the Nutrition Facts label.   Take all medicine as directed by your caregiver.   If your caregiver has given you a follow-up appointment, it is very important that you go. Not going could result in lasting (chronic) or permanent injury, pain, and disability. If there is any problem keeping the appointment, call to reschedule.  SEEK MEDICAL CARE IF:   Your pain does not improve.   You have a hard time advancing your diet beyond clear liquids.   Your bowel movements do not return to normal.  SEEK IMMEDIATE  MEDICAL CARE IF:   Your pain becomes worse.   You have an oral temperature above 102 F (38.9 C), not controlled by medicine.   You have repeated vomiting.   You have bloody or black, tarry stools.   Symptoms that brought you to your caregiver become worse or are not getting better.  MAKE SURE YOU:   Understand these instructions.   Will watch your condition.   Will get help right away if you are not doing well or get worse.  Document Released: 04/04/2005 Document Revised: 03/07/2011 Document Reviewed: 07/31/2010 [facility] Patient Information 2012 [place], [place].

## 2011-08-29 NOTE — Progress Notes (Signed)
Subjective: No pain, eating well on soft mech. Afebrile.  Objective: Vital signs in last 24 hours: Filed Vitals:   08/28/11 0547 08/28/11 1350 08/28/11 2201 08/29/11 0607  BP: 117/74 104/67 112/76 110/75  Pulse: 87 66 69 75  Temp: 97.1 F (36.2 C) 97.6 F (36.4 C) 97.9 F (36.6 C) 97.3 F (36.3 C)  TempSrc: Oral Oral    Resp: 17 18 18 18   Height:      Weight:      SpO2: 94% 94% 96% 97%   Weight change:   Intake/Output Summary (Last 24 hours) at 08/29/11 1203 Last data filed at 08/29/11 1020  Gross per 24 hour  Intake 3633.75 ml  Output      0 ml  Net 3633.75 ml   Physical Exam: Gen: NAD, resting comfortably Abd: soft, NT/ND, NABS  Lab Results: Basic Metabolic Panel:  Lab 08/27/11 1610 08/26/11 0610  NA 139 138  K 3.8 3.6  CL 108 105  CO2 24 26  GLUCOSE 110* 117*  BUN 7 12  CREATININE 0.65 0.69  CALCIUM 9.2 9.1  MG -- --  PHOS -- --   Liver Function Tests:  Lab 08/24/11 1623  AST 21  ALT 16  ALKPHOS 104  BILITOT 0.5  PROT 8.1  ALBUMIN 3.7    Lab 08/24/11 1623  LIPASE 23  AMYLASE --   CBC:  Lab 08/29/11 0517 08/28/11 0624 08/24/11 1623  WBC 6.5 7.5 --  NEUTROABS -- -- 16.1*  HGB 10.8* 10.6* --  HCT 31.7* 30.8* --  MCV 94.1 93.3 --  PLT 270 255 --   Urinalysis:  Lab 08/24/11 2106  COLORURINE YELLOW  LABSPEC 1.006  PHURINE 6.0  GLUCOSEU NEGATIVE  HGBUR NEGATIVE  BILIRUBINUR NEGATIVE  KETONESUR NEGATIVE  PROTEINUR NEGATIVE  UROBILINOGEN 0.2  NITRITE NEGATIVE  LEUKOCYTESUR NEGATIVE    Studies/Results: No results found. Medications: I have reviewed the patient's current medications. Scheduled Meds:    . ciprofloxacin  500 mg Oral BID  . metroNIDAZOLE  500 mg Oral Q8H   Continuous Infusions:    . 0.45 % NaCl with KCl 20 mEq / L 75 mL/hr at 08/29/11 0402   PRN Meds:.acetaminophen, acetaminophen, docusate sodium, hydrALAZINE, HYDROcodone-acetaminophen, morphine, ondansetron (ZOFRAN) IV, ondansetron, polyethylene  glycol  Assessment/Plan: # Diverticulitis Abd pain still resolved.  Afebrile, WBC wnl.  Passing flatus, + BM yesterday.  Defer to surgery judgement on d/c possibly today. - appreciate surgery following - cipro 500mg  bid - metronidazole 500mg  TID - con't soft diet per surgery - Pain medicines as needed  - zofran as needed  - IVF - f/u colonoscopy in 6-8 weeks  # Anemia Stable today, anemia panel unremarkable.  - f/u as outpt  # Hypertension  - Was controlled on HCTZ only, and was controlled in clinic today.  - Hold HCTZ  - hydralazine PRN   # Hypokalemia Repleted   LOS: 5 days   [name], [name] 08/29/2011, 12:03 PM 08/29/2011, 12:03 PM

## 2011-09-07 ENCOUNTER — Encounter: Payer: Self-pay | Admitting: Ophthalmology

## 2011-09-07 ENCOUNTER — Ambulatory Visit (INDEPENDENT_AMBULATORY_CARE_PROVIDER_SITE_OTHER): Payer: 59 | Admitting: Ophthalmology

## 2011-09-07 DIAGNOSIS — K5732 Diverticulitis of large intestine without perforation or abscess without bleeding: Secondary | ICD-10-CM

## 2011-09-07 DIAGNOSIS — I10 Essential (primary) hypertension: Secondary | ICD-10-CM

## 2011-09-07 DIAGNOSIS — B379 Candidiasis, unspecified: Secondary | ICD-10-CM

## 2011-09-07 MED ORDER — FLUCONAZOLE 150 MG PO TABS: 150.0000 mg | ORAL_TABLET | Freq: Once | ORAL | Status: AC

## 2011-09-07 NOTE — Assessment & Plan Note (Signed)
Will continue salt-restricted diet and no medications. Patient will check BP at home daily for 2 weeks and then once weekly if values continue to be normal.

## 2011-09-07 NOTE — Assessment & Plan Note (Signed)
Patient is doing low fiber diet and is finishing course of antibiotics. She says she is interested in doing partial colectomy to treat he diverticulitis since she already has a high fiber diet and had such a severe initial presentation. She discussed with her surgeon as an inpatient that the dietary advice is very unclear and that surgical management is more definitive. I showed her a summary of the literature such that diverticulitis recurs in about 1/3 of people. She will get colonoscopy in 5 weeks. I advised her to discuss this with the gastroenterologist as well to get a medical vs. surgical opinion.

## 2011-09-07 NOTE — Progress Notes (Signed)
Subjective:   [name] female   DOB: 11-Aug-1965 46 y.o.   MRN: [id number]  HPI: [name] is a 46 y.o. here for hospital follow up after perforated diverticulitis.  Promises she will make appt for colonoscopy for 6 weeks out today. Has 3 more days of antibiotics. Is taking low fiber diet for 2 weeks until 3/5 - eggs, mashed potatoes, white bread etc. Is feeling full very quickly. Has a small amount of pressure after eating but no pain or nausea. Is taking dulcolax a few times for concern of low fiber intake. BM once a day.   Yeast infection: has itching and small amount of white discharge. No raw tender skin.  Hypertension: Blood pressure was good throughout admission, so her HCTZ was not restarted. Blood pressure has continued to be good since discharge. Has been monitoring BP on a daily basis and has been within normal limits.   Past Medical History  Diagnosis Date  . BPPV (benign paroxysmal positional vertigo) 05/07/11    Described as room spinning, notes 2 episodes prior to Oct 2012.  Plans to undergo vestibular rehab.  . Hypertension 05/07/11    with grade 2 diastolic dysfunction  . PVC (premature ventricular contraction) 05/07/11    incidentally seen on telemetry  . GERD (gastroesophageal reflux disease)    Current Outpatient Prescriptions  Medication Sig Dispense Refill  . ciprofloxacin (CIPRO) 500 MG tablet Take 1 tablet (500 mg total) by mouth 2 (two) times daily. For 10 days.  20 tablet  0  . metroNIDAZOLE (FLAGYL) 500 MG tablet Take 1 tablet (500 mg total) by mouth every 8 (eight) hours. For 10 days  30 tablet  0  . Multiple Vitamin (MULITIVITAMIN WITH MINERALS) TABS Take 1 tablet by mouth daily.       Family History  Problem Relation Age of Onset  . Diabetes Mother   . Diabetes Maternal Grandmother   . Breast cancer Mother   . Cervical cancer Maternal Aunt   . Deep vein thrombosis Father     died from cerebral embolus   History    Social History  . Marital Status: Single    Spouse Name: N/A    Number of Children: 2  . Years of Education: N/A   Occupational History  .     Social History Main Topics  . Smoking status: Current Some Day Smoker -- 0.1 packs/day for 20 years    Types: Cigars  . Smokeless tobacco: Never Used   Comment: 1 black & mild a week  . Alcohol Use: Yes     1 drink every few months  . Drug Use: None  . Sexually Active: Yes    Birth Control/ Protection: None   Other Topics Concern  . None   Social History Narrative   Has two children and one granddaughter. Lives by herself.   Objective:  Physical Exam: Filed Vitals:   09/07/11 1037  BP: 115/80  Pulse: 74  Temp: 97.8 F (36.6 C)  TempSrc: Oral  Height: 5\' 7"  (1.702 m)  Weight: 196 lb 3.2 oz (88.996 kg)  SpO2: 98%   General: pleasant young appearing woman sitting in chair in no acute distress HEENT: PERRL, EOMI, no scleral icterus Cardiac: RRR, no rubs, murmurs or gallops Pulm: clear to auscultation bilaterally, moving normal volumes of air Abd: soft, nontender, nondistended, BS present Ext: warm and well perfused, no pedal edema Neuro: alert and oriented X3, cranial nerves II-XII grossly intact  Assessment &  Plan:

## 2011-09-07 NOTE — Patient Instructions (Signed)
Remember to call to schedule colonoscopy.  Good luck if you decide to pursue surgery.

## 2011-09-07 NOTE — Assessment & Plan Note (Signed)
Patient has had yeast infections in the past and is familiar with symptoms. I prescribed her a single dose of fluconazole. Is likely related to ongoing antibiotic use. She will wait until completing antibiotic course before starting fluconazole.

## 2011-09-17 ENCOUNTER — Encounter: Payer: Self-pay | Admitting: Internal Medicine

## 2011-10-02 ENCOUNTER — Encounter: Payer: Self-pay | Admitting: Internal Medicine

## 2011-10-03 ENCOUNTER — Encounter: Payer: Self-pay | Admitting: Internal Medicine

## 2011-10-03 ENCOUNTER — Ambulatory Visit (INDEPENDENT_AMBULATORY_CARE_PROVIDER_SITE_OTHER): Payer: 59 | Admitting: Internal Medicine

## 2011-10-03 VITALS — BP 138/80 | HR 72 | Ht 67.0 in | Wt 194.6 lb

## 2011-10-03 DIAGNOSIS — Z1211 Encounter for screening for malignant neoplasm of colon: Secondary | ICD-10-CM

## 2011-10-03 DIAGNOSIS — K5732 Diverticulitis of large intestine without perforation or abscess without bleeding: Secondary | ICD-10-CM

## 2011-10-03 DIAGNOSIS — K219 Gastro-esophageal reflux disease without esophagitis: Secondary | ICD-10-CM

## 2011-10-03 DIAGNOSIS — K5792 Diverticulitis of intestine, part unspecified, without perforation or abscess without bleeding: Secondary | ICD-10-CM

## 2011-10-03 MED ORDER — PEG-KCL-NACL-NASULF-NA ASC-C 100 G PO SOLR: 1.0000 | Freq: Once | ORAL | Status: DC

## 2011-10-03 MED ORDER — FAMOTIDINE 20 MG PO TABS: 20.0000 mg | ORAL_TABLET | Freq: Two times a day (BID) | ORAL | Status: DC

## 2011-10-03 MED ORDER — POLYETHYLENE GLYCOL 3350 17 GM/SCOOP PO POWD: 17.0000 g | Freq: Every day | ORAL | Status: AC

## 2011-10-03 NOTE — Progress Notes (Signed)
Subjective:    [name], female    DOB: 1966/01/11, 46 y.o.   MRN: [id number]  HPI Ms. Rawlinson is a 46 year old female with a past medical history of GERD, hypertension, BPPV, and recent diverticulitis who is seen in consultation at the request of Dr. Maisie Fus for evaluation of recent diverticulitis and possible colonoscopy. The patient ports developing severe lower abdominal pain in mid February. This pain started fairly abruptly and was associated with fever to as high as 103F.  This prompted a CT scan which revealed left-sided diverticulitis and she was hospitalized for 5 days. There was concern for microperforation, but no abscess or fluid collection was seen. 2 treat with IV antibiotics and then discharged. She did not have any surgery or procedures while in the hospital. She has made a near complete recovery, though she occasionally feels "twinges" of lower abdominal pain, and this can be worse with urinating and after eating. This is happening only occasionally and is not severe. She does report some constipation and she is using Dulcolax for this intermittently. She's had no rectal bleeding or melena. No fever or chills. No nausea or vomiting. She has noticed some heartburn, burning in her throat and bitter taste in her mouth. This has been going on for maybe 2 months. No trouble with swallowing, or odynophagia. No chest pain. No shortness of breath.  Review of Systems As per history of present illness, otherwise negative  Past Medical History  Diagnosis Date  . BPPV (benign paroxysmal positional vertigo) 05/07/11    Described as room spinning, notes 2 episodes prior to Oct 2012.  Plans to undergo vestibular rehab.  . Hypertension 05/07/11    with grade 2 diastolic dysfunction  . PVC (premature ventricular contraction) 05/07/11    incidentally seen on telemetry  . GERD (gastroesophageal reflux disease)    Current Outpatient Prescriptions  Medication Sig Dispense  Refill  . Multiple Vitamin (MULITIVITAMIN WITH MINERALS) TABS Take 1 tablet by mouth daily.      . famotidine (PEPCID) 20 MG tablet Take 1 tablet (20 mg total) by mouth 2 (two) times daily.  60 tablet  3  . peg 3350 powder (MOVIPREP) SOLR Take 1 kit (100 g total) by mouth once.  1 kit  0  . polyethylene glycol powder (MIRALAX) powder Take 17 g by mouth daily.  255 g  0   Allergies  Allergen Reactions  . Lisinopril Cough   Family History  Problem Relation Age of Onset  . Diabetes Mother   . Diabetes Maternal Grandmother   . Breast cancer Mother   . Cervical cancer Maternal Aunt   . Deep vein thrombosis Father     died from cerebral embolus   History   Social History  . Marital Status: Single    Spouse Name: N/A    Number of Children: 2  . Years of Education: N/A   Occupational History  .     Social History Main Topics  . Smoking status: Former Smoker -- 0.1 packs/day for 20 years    Types: Cigarettes  . Smokeless tobacco: Never Used   Comment: 1 black & mild a week  . Alcohol Use: Yes     1 drink every few months  . Drug Use: None  . Sexually Active: Yes    Birth Control/ Protection: None   Other Topics Concern  . None   Social History Narrative   Has two children and one granddaughter. Lives by herself.  Objective:   Physical Exam BP 138/80  Pulse 72  Ht 5\' 7"  (1.702 m)  Wt 194 lb 9.6 oz (88.27 kg)  BMI 30.48 kg/m2 Constitutional: Well-developed and well-nourished. No distress. HEENT: Normocephalic and atraumatic. Oropharynx is clear and moist. No oropharyngeal exudate. Conjunctivae are normal. Pupils are equal round and reactive to light. No scleral icterus. Neck: Neck supple. Trachea midline. Cardiovascular: Normal rate, regular rhythm and intact distal pulses. No M/R/G Pulmonary/chest: Effort normal and breath sounds normal. No wheezing, rales or rhonchi. Abdominal: Soft, very mild suprapubic pain without rebound or guarding, nondistended. Bowel  sounds active throughout. There are no masses palpable. No hepatosplenomegaly. Extremities: no clubbing, cyanosis, or edema Lymphadenopathy: No cervical adenopathy noted. Neurological: Alert and oriented to person place and time. Skin: Skin is warm and dry. No rashes noted. Psychiatric: Normal mood and affect. Behavior is normal.  CBC    Component Value Date/Time   WBC 6.5 08/29/2011 0517   RBC 3.37* 08/29/2011 0517   HGB 10.8* 08/29/2011 0517   HCT 31.7* 08/29/2011 0517   PLT 270 08/29/2011 0517   MCV 94.1 08/29/2011 0517   MCH 32.0 08/29/2011 0517   MCHC 34.1 08/29/2011 0517   RDW 12.0 08/29/2011 0517   LYMPHSABS 3.5 08/24/2011 1623   MONOABS 1.2* 08/24/2011 1623   EOSABS 0.0 08/24/2011 1623   BASOSABS 0.0 08/24/2011 1623   Imaging from 08/24/11 CT ABDOMEN AND PELVIS WITH CONTRAST   Technique:  Multidetector CT imaging of the abdomen and pelvis was performed following the standard protocol during bolus administration of intravenous contrast.   Contrast: OMNIPAQUE IOHEXOL 300 MG/ML IV SOLN   Comparison: None.   Findings: The visualized lung bases are clear.   The liver and spleen are unremarkable in appearance.  The gallbladder is within normal limits.  The pancreas and adrenal glands are unremarkable.   The kidneys are unremarkable in appearance.  There is no evidence of hydronephrosis.  No renal or ureteral stones are seen.  No perinephric stranding is appreciated.   No free fluid is identified.  The small bowel is unremarkable in appearance.  The stomach is within normal limits.  No acute vascular abnormalities are seen.  Scattered calcification is noted along the distal abdominal aorta and its branches.   The appendix is normal in caliber, partially filled with contrast, and containing air, without evidence for appendicitis.   There is focal soft tissue inflammation noted at the mid pelvis adjacent to the mid sigmoid colon, with associated inflamed  poorly characterized diverticula, and scattered minimal soft tissue air thought to reflect a microperforation.  This is concerning for acute diverticulitis.  No significant free fluid is seen; there is no evidence for abscess formation.   Contrast progresses to this point in the colon; there is minimal diverticulosis along the distal descending colon.   The bladder is mildly distended and grossly unremarkable in appearance.  The patient is status post hysterectomy; no suspicious adnexal masses are seen.  The ovaries appear relatively symmetric. No inguinal lymphadenopathy is seen.   No acute osseous abnormalities are identified.   IMPRESSION:   1.  Focal diverticulitis noted at the mid sigmoid colon, with soft tissue inflammation and likely microperforation.  No evidence of significant bowel perforation or abscess formation.  No free fluid seen.   2.  No evidence of appendicitis. 3.  Scattered calcification along the distal abdominal aorta and its branches.      Assessment & Plan:  46 year old female with a past medical  history of GERD, hypertension, BPPV, and recent diverticulitis who is seen in consultation at the request of Dr. Maisie Fus for evaluation of recent diverticulitis and possible colonoscopy.   1. Resolved diverticulitis -- the patient did have an episode of diverticulitis required hospitalization and IV antibiotics. There was some concern for microperforation, but this did not require surgery and no drainable fluid collection was found. At this point she has made a recovery and is doing well. She does seem to be having some constipation, and I recommended MiraLAX 17 g daily for this. Hopefully this will cause less lower abdominal spasm and Dulcolax. She plans to stop the Dulcolax for now which I think is a good idea. I have recommended colonoscopy, both based on her age for screening and given her history of recent diverticulitis. We discussed the procedure including the  risk and benefits and she is agreeable to proceed.  2. GERD -- her upper GI symptoms are consistent with reflux disease, mild. I recommended when necessary twice a day famotidine 20 mg. I've asked that she is finding that she needs this daily, to contact our office and we would consider PPI therapy at that time. There are  No alarm symptoms at this time.

## 2011-10-03 NOTE — Patient Instructions (Signed)
You have been scheduled for a colonoscopy with propofol. Please follow written instructions given to you at your visit today.  Please pick up your prep kit at the pharmacy within the next 1-3 days.  We have sent the following medications to your pharmacy for you to pick up at your convenience: moviprep, pepsid & Miralax  Follow up with Dr, Gaynelle Adu in Surgery after your colonoscopy.

## 2011-10-25 ENCOUNTER — Encounter: Payer: Self-pay | Admitting: Internal Medicine

## 2011-10-25 ENCOUNTER — Ambulatory Visit (AMBULATORY_SURGERY_CENTER): Payer: 59 | Admitting: Internal Medicine

## 2011-10-25 VITALS — BP 130/80 | HR 68 | Temp 96.5°F | Resp 18 | Ht 67.0 in | Wt 194.0 lb

## 2011-10-25 DIAGNOSIS — K635 Polyp of colon: Secondary | ICD-10-CM

## 2011-10-25 DIAGNOSIS — D126 Benign neoplasm of colon, unspecified: Secondary | ICD-10-CM

## 2011-10-25 DIAGNOSIS — Z1211 Encounter for screening for malignant neoplasm of colon: Secondary | ICD-10-CM

## 2011-10-25 DIAGNOSIS — K5732 Diverticulitis of large intestine without perforation or abscess without bleeding: Secondary | ICD-10-CM

## 2011-10-25 MED ORDER — SODIUM CHLORIDE 0.9 % IV SOLN: 500.0000 mL | INTRAVENOUS | Status: DC

## 2011-10-25 NOTE — Patient Instructions (Signed)

## 2011-10-25 NOTE — Progress Notes (Signed)
Patient did not experience any of the following events: a burn prior to discharge; a fall within the facility; wrong site/side/patient/procedure/implant event; or a hospital transfer or hospital admission upon discharge from the facility. (G8907) Patient did not have preoperative order for IV antibiotic SSI prophylaxis. (G8918)  

## 2011-10-25 NOTE — Op Note (Signed)
[facility] [address] [place], [place]  [address]  COLONOSCOPY PROCEDURE REPORT  PATIENT:  [name], [name]  MR#:  [id number] BIRTHDATE:  1965-07-12, 45 yrs. old  GENDER:  female ENDOSCOPIST:  Carie Caddy. Laiklynn Raczynski, MD REF. BY:  Margorie John, MD PROCEDURE DATE:  10/25/2011 PROCEDURE:  Colon with cold biopsy polypectomy ASA CLASS:  Class II INDICATIONS:  diverticulitis MEDICATIONS:   MAC sedation, administered by CRNA, propofol (Diprivan) 400 mg  DESCRIPTION OF PROCEDURE:   After the risks benefits and alternatives of the procedure were thoroughly explained, informed consent was obtained.  Digital rectal exam was performed and revealed no rectal masses.   The LB 180AL [id number] endoscope was introduced through the anus and advanced to the terminal ileum which was intubated for a short distance, without limitations. The quality of the prep was good, using MoviPrep.  The instrument was then slowly withdrawn as the colon was fully examined. <<PROCEDUREIMAGES>> FINDINGS:  The terminal ileum appeared normal.  Mild diverticulosis was found in the left colon.  A 3 mm sessile polyp was found in the recto-sigmoid colon. The polyp was removed using cold biopsy forceps.   Retroflexed views in the rectum revealed no abnormalities.    The scope was then withdrawn from the cecum and the procedure completed.  COMPLICATIONS:  None  ENDOSCOPIC IMPRESSION: 1) Normal terminal ileum 2) Mild diverticulosis in the left colon 3) Sessile polyp in the recto-sigmoid colon. Removed and sent to pathology.  RECOMMENDATIONS: 1) Await pathology results 2) High fiber diet. 3) If the polyp removed today are proven to be adenomatous (pre-cancerous) polyps, you will need a repeat colonoscopy in 5 years. Otherwise you should continue to follow colorectal cancer screening guidelines for "routine risk" patients with colonoscopy in 10 years. You will receive a letter within 1-2 weeks with the results of  your biopsy as well as final recommendations. Please call my office if you have not received a letter after 3 weeks.  Carie Caddy. Rhea Belton, MD  CC:  The Patient Margorie John, MD  n. Rosalie DoctorCarie Caddy. Mishayla Sliwinski at 10/25/2011 02:59 PM  Wilber Oliphant, [phone]

## 2011-10-26 ENCOUNTER — Telehealth: Payer: Self-pay

## 2011-10-26 NOTE — Telephone Encounter (Signed)
  Follow up Call-  Call back number 10/25/2011  Post procedure Call Back phone  # [phone]  Permission to leave phone message Yes     Patient questions:  Do you have a fever, pain , or abdominal swelling? no Pain Score  0 *  Have you tolerated food without any problems? yes  Have you been able to return to your normal activities? yes  Do you have any questions about your discharge instructions: Diet   no Medications  no Follow up visit  no  Do you have questions or concerns about your Care? no  Actions: * If pain score is 4 or above: No action needed, pain <4.

## 2011-11-01 ENCOUNTER — Encounter: Payer: Self-pay | Admitting: Internal Medicine

## 2011-11-07 NOTE — Progress Notes (Signed)
Addended by: Remus Blake on: 11/07/2011 03:35 PM   Modules accepted: Orders

## 2012-05-21 ENCOUNTER — Emergency Department (INDEPENDENT_AMBULATORY_CARE_PROVIDER_SITE_OTHER): Payer: 59

## 2012-05-21 ENCOUNTER — Emergency Department (HOSPITAL_COMMUNITY)
Admission: EM | Admit: 2012-05-21 | Discharge: 2012-05-21 | Disposition: A | Discharge status: Home / Self Care | Payer: 59 | Source: Home / Self Care | Attending: Emergency Medicine | Admitting: Emergency Medicine

## 2012-05-21 ENCOUNTER — Encounter (HOSPITAL_COMMUNITY): Payer: Self-pay | Admitting: Emergency Medicine

## 2012-05-21 DIAGNOSIS — R0789 Other chest pain: Secondary | ICD-10-CM

## 2012-05-21 HISTORY — DX: Diverticulosis of intestine, part unspecified, without perforation or abscess without bleeding: K57.90

## 2012-05-21 LAB — POCT URINALYSIS DIP (DEVICE)
Leukocytes, UA: NEGATIVE
Protein, ur: NEGATIVE mg/dL
Urobilinogen, UA: 0.2 mg/dL (ref 0.0–1.0)

## 2012-05-21 LAB — D-DIMER, QUANTITATIVE: D-Dimer, Quant: <0.27 ug/mL-FEU (ref 0.00–0.48)

## 2012-05-21 MED ORDER — TRAMADOL HCL 50 MG PO TABS: 100.0000 mg | ORAL_TABLET | Freq: Three times a day (TID) | ORAL | Status: DC | PRN

## 2012-05-21 MED ORDER — METHOCARBAMOL 500 MG PO TABS: 500.0000 mg | ORAL_TABLET | Freq: Three times a day (TID) | ORAL | Status: DC

## 2012-05-21 MED ORDER — IBUPROFEN 800 MG PO TABS
800.0000 mg | ORAL_TABLET | Freq: Once | ORAL | Status: AC
  Administered 2012-05-21: 800 mg via ORAL

## 2012-05-21 MED ORDER — MELOXICAM 15 MG PO TABS: 15.0000 mg | ORAL_TABLET | Freq: Every day | ORAL | Status: DC

## 2012-05-21 MED ORDER — IBUPROFEN 800 MG PO TABS
ORAL_TABLET | ORAL | Status: AC
  Filled 2012-05-21: qty 1

## 2012-05-21 NOTE — ED Notes (Signed)
MD at bedside. 

## 2012-05-21 NOTE — ED Provider Notes (Signed)
Chief Complaint  Patient presents with  . Back Pain    History of Present Illness:   The patient is a 46 year old female who has had a two-day history of left upper back pain, right below the shoulder blade area. This occurred after bowling with her daughter. She describes a constant burning pain in that area with a sharp stabbing pain. The pain is worse with deep inspiration, coughing, movement, and sitting. It is better if she stands up straight. She has felt slightly short of breath with the pain. She denies any pain in her neck or or pain radiating down her arm. No pain or paresthesias in the left arm or swelling. There is no pain in the anterior chest or lower back. She denies fever, chills, coughing, wheezing, or hemoptysis. She's had no palpitations, dizziness, or syncope. No GI complaints. She never had anything like this before. She denies any swelling or pain in her legs and has no history of thrombophlebitis, blood clots, or pulmonary emboli.  Review of Systems:  Other than noted above, the patient denies any of the following symptoms. Systemic:  No fever, chills, sweats, or fatigue. ENT:  No nasal congestion, rhinorrhea, or sore throat. Pulmonary:  No cough, wheezing, shortness of breath, sputum production, hemoptysis. Cardiac:  No palpitations, rapid heartbeat, dizziness, presyncope or syncope. GI:  No abdominal pain, heartburn, nausea, or vomiting. Ext:  No leg pain or swelling.  PMFSH:  Past medical history, family history, social history, meds, and allergies were reviewed and updated as needed.   Physical Exam:   Vital signs:  BP 127/84  Pulse 76  Temp 98.3 F (36.8 C) (Oral)  Resp 18  SpO2 98% Gen:  Alert, oriented, in no distress, skin warm and dry. Eye:  PERRL, lids and conjunctivas normal.  Sclera non-icteric. ENT:  Mucous membranes moist, pharynx clear. Neck:  Supple, no adenopathy or tenderness.  No JVD. Lungs:  Clear to auscultation, no wheezes, rales or rhonchi.   No respiratory distress. Heart:  Regular rhythm.  No gallops, murmers, clicks or rubs. Chest:  No chest wall tenderness. There is no tenderness to palpation in the upper back. The neck and shoulder have full range of motion with no pain. Abdomen:  Soft, nontender, no organomegaly or mass.  Bowel sounds normal.  No pulsatile abdominal mass or bruit. Ext:  No edema.  No calf tenderness and Homann's sign negative.  Pulses full and equal. Skin:  Warm and dry.  No rash.  Labs:   Results for orders placed during the hospital encounter of 05/21/12  D-DIMER, QUANTITATIVE      Component Value Range   D-Dimer, Quant <0.27  0.00 - 0.48 ug/mL-FEU  POCT URINALYSIS DIP (DEVICE)      Component Value Range   Glucose, UA NEGATIVE  NEGATIVE mg/dL   Bilirubin Urine NEGATIVE  NEGATIVE   Ketones, ur NEGATIVE  NEGATIVE mg/dL   Specific Gravity, Urine 1.010  1.005 - 1.030   Hgb urine dipstick NEGATIVE  NEGATIVE   pH 6.5  5.0 - 8.0   Protein, ur NEGATIVE  NEGATIVE mg/dL   Urobilinogen, UA 0.2  0.0 - 1.0 mg/dL   Nitrite NEGATIVE  NEGATIVE   Leukocytes, UA NEGATIVE  NEGATIVE     Radiology:  Dg Chest 2 View  05/21/2012  *RADIOLOGY REPORT*  Clinical Data: Left posterior chest pain for 2 days  CHEST - 2 VIEW  Comparison: Chest x-ray of 03/29/2005  Findings: No active infiltrate or effusion is seen.  As  noted previously there is mild peribronchial thickening consistent with bronchitis.  Mediastinal contours are stable.  The heart is within upper limits of normal.  No bony abnormality is seen.  IMPRESSION: No active lung disease.  Probable chronic bronchitis   Original Report Authenticated By: Dwyane Dee, M.D.    I reviewed the images independently and personally and concur with the radiologist's findings.  Medications given in UCC:  She was given ibuprofen 800 mg by mouth and tolerated this well. She did notice slight improvement in her symptoms thereafter.  Assessment:  The encounter diagnosis was  Musculoskeletal chest pain.  Plan:   1.  The following meds were prescribed:   New Prescriptions   MELOXICAM (MOBIC) 15 MG TABLET    Take 1 tablet (15 mg total) by mouth daily.   METHOCARBAMOL (ROBAXIN) 500 MG TABLET    Take 1 tablet (500 mg total) by mouth 3 (three) times daily.   TRAMADOL (ULTRAM) 50 MG TABLET    Take 2 tablets (100 mg total) by mouth every 8 (eight) hours as needed for pain.   2.  The patient was instructed in symptomatic care and handouts were given. 3.  The patient was told to return if becoming worse in any way, if no better in 3 or 4 days, and given some red flag symptoms that would indicate earlier return.    Reuben Likes, MD 05/21/12 [phone]

## 2012-05-21 NOTE — ED Notes (Signed)
C/o back spasms , mid left back-"burning" and sometimes sharp.  Sitting makes pain hurt worse than standing.  Reports history of lower back pain.  Pain for 2 days.  No known injury.  Patient is right handed

## 2012-05-21 NOTE — ED Notes (Signed)
Assisted with positioning of bed, warm blankets to back.

## 2012-06-10 ENCOUNTER — Other Ambulatory Visit: Payer: Self-pay | Admitting: Obstetrics and Gynecology

## 2012-06-10 DIAGNOSIS — Z1231 Encounter for screening mammogram for malignant neoplasm of breast: Secondary | ICD-10-CM

## 2012-07-16 ENCOUNTER — Ambulatory Visit
Admission: RE | Admit: 2012-07-16 | Discharge: 2012-07-16 | Disposition: A | Discharge status: Home / Self Care | Payer: 59 | Source: Ambulatory Visit | Attending: Obstetrics and Gynecology | Admitting: Obstetrics and Gynecology

## 2012-07-16 DIAGNOSIS — Z1231 Encounter for screening mammogram for malignant neoplasm of breast: Secondary | ICD-10-CM

## 2012-09-24 ENCOUNTER — Emergency Department (INDEPENDENT_AMBULATORY_CARE_PROVIDER_SITE_OTHER): Payer: 59

## 2012-09-24 ENCOUNTER — Emergency Department (HOSPITAL_COMMUNITY)
Admission: EM | Admit: 2012-09-24 | Discharge: 2012-09-24 | Disposition: A | Discharge status: Home / Self Care | Payer: 59 | Source: Home / Self Care | Attending: Family Medicine | Admitting: Family Medicine

## 2012-09-24 ENCOUNTER — Encounter (HOSPITAL_COMMUNITY): Payer: Self-pay | Admitting: *Deleted

## 2012-09-24 DIAGNOSIS — S83411A Sprain of medial collateral ligament of right knee, initial encounter: Secondary | ICD-10-CM

## 2012-09-24 MED ORDER — IBUPROFEN 800 MG PO TABS: 800.0000 mg | ORAL_TABLET | Freq: Three times a day (TID) | ORAL | Status: DC

## 2012-09-24 NOTE — ED Notes (Signed)
Slid on ramp on ice on Monday.  Landed in a half split with R knee bent back and L leg straight in front of her.  C/o pain and swelling to R knee.  Has been alternating heat and ice and taking Ibuprofen. Pain is worse today.  If it is straight, it hurts to bend and if its bent it hurts to straighten it.  It hurts to put her heel down today.

## 2012-09-24 NOTE — ED Provider Notes (Signed)
History     CSN: [id number]  Arrival date & time 09/24/12  1538   First MD Initiated Contact with Patient 09/24/12 1546      Chief Complaint  Patient presents with  . Knee Pain    (Consider location/radiation/quality/duration/timing/severity/associated sxs/prior treatment) Patient is a 47 y.o. female presenting with knee pain. The history is provided by the patient.  Knee Pain Location:  Knee Time since incident:  2 days Injury: yes   Mechanism of injury: fall   Fall:    Impact surface:  [employer] of impact:  Knees Pain details:    Progression:  Unchanged Chronicity:  New Dislocation: no   Worsened by:  Flexion and bearing weight Associated symptoms: decreased ROM     Past Medical History  Diagnosis Date  . BPPV (benign paroxysmal positional vertigo) 05/07/11    Described as room spinning, notes 2 episodes prior to Oct 2012.  Plans to undergo vestibular rehab.  . Hypertension 05/07/11    with grade 2 diastolic dysfunction  . PVC (premature ventricular contraction) 05/07/11    incidentally seen on telemetry  . GERD (gastroesophageal reflux disease)   . Diverticulosis     Past Surgical History  Procedure Laterality Date  . Vaginal hysterectomy  07/2010  . Umbilical hernia repair      Approximately 1974  . Tonsillectomy    . Cervical biopsy  w/ loop electrode excision      Family History  Problem Relation Age of Onset  . Diabetes Mother   . Breast cancer Mother   . Diabetes Maternal Grandmother   . Cervical cancer Maternal Aunt   . Deep vein thrombosis Father     died from cerebral embolus    History  Substance Use Topics  . Smoking status: Former Smoker -- 0.10 packs/day for 20 years    Types: Cigarettes    Quit date: 07/09/2005  . Smokeless tobacco: Never Used     Comment: 1 black & mild a week  . Alcohol Use: Yes     Comment: occasional glass of wine    OB History   Grav Para Term Preterm Abortions TAB SAB Ect Mult Living                   Review of Systems  Constitutional: Negative.   Musculoskeletal: Positive for gait problem. Negative for joint swelling.    Allergies  Lisinopril  Home Medications   Current Outpatient Rx  Name  Route  Sig  Dispense  Refill  . Estrogens Conjugated (PREMARIN PO)   Oral   Take by mouth.         . famotidine (PEPCID) 20 MG tablet   Oral   Take 1 tablet (20 mg total) by mouth 2 (two) times daily.   60 tablet   3   . ibuprofen (ADVIL,MOTRIN) 200 MG tablet   Oral   Take 400 mg by mouth every 6 (six) hours as needed for pain.         . Multiple Vitamin (MULITIVITAMIN WITH MINERALS) TABS   Oral   Take 1 tablet by mouth daily.         . meloxicam (MOBIC) 15 MG tablet   Oral   Take 1 tablet (15 mg total) by mouth daily.   15 tablet   0   . methocarbamol (ROBAXIN) 500 MG tablet   Oral   Take 1 tablet (500 mg total) by mouth 3 (three) times daily.   30  tablet   0   . polyethylene glycol powder (GLYCOLAX/MIRALAX) powder               . traMADol (ULTRAM) 50 MG tablet   Oral   Take 2 tablets (100 mg total) by mouth every 8 (eight) hours as needed for pain.   30 tablet   0     BP 134/72  Pulse 65  Temp(Src) 97.7 F (36.5 C) (Oral)  Resp 18  SpO2 96%  Physical Exam  Nursing note and vitals reviewed. Constitutional: She is oriented to person, place, and time. She appears well-developed and well-nourished.  Musculoskeletal: She exhibits tenderness.       Right knee: She exhibits decreased range of motion and MCL laxity. She exhibits no effusion, normal alignment, normal patellar mobility, no bony tenderness and normal meniscus. Tenderness found. Medial joint line and MCL tenderness noted. No lateral joint line, no LCL and no patellar tendon tenderness noted.  Neurological: She is alert and oriented to person, place, and time.  Skin: Skin is warm and dry.    ED Course  Procedures (including critical care time)  Labs Reviewed - No data to display Dg  Knee Complete 4 Views Right  09/24/2012  *RADIOLOGY REPORT*  Clinical Data: Larey Seat 2 days ago, persistent right knee pain.  RIGHT KNEE - COMPLETE 4+ VIEW  Comparison: None.  Findings: No evidence of acute or subacute fracture or dislocation. Well-preserved joint spaces.  No intrinsic osseous abnormalities. No evidence of a significant joint effusion.  IMPRESSION: Normal examination.   Original Report Authenticated By: Hulan Saas, M.D.      1. Sprain and strain of medial collateral ligament of knee, right, initial encounter       MDM  X-rays reviewed and report per radiologist.         Linna Hoff, MD 09/24/12 [phone]

## 2012-10-27 ENCOUNTER — Telehealth: Payer: Self-pay | Admitting: Internal Medicine

## 2012-10-27 DIAGNOSIS — K219 Gastro-esophageal reflux disease without esophagitis: Secondary | ICD-10-CM

## 2012-10-28 MED ORDER — FAMOTIDINE 20 MG PO TABS: 20.0000 mg | ORAL_TABLET | Freq: Two times a day (BID) | ORAL | Status: DC

## 2012-10-28 NOTE — Telephone Encounter (Signed)
Spoke to pt. Asked her if Pepcid was helping her.  She said ' oh my god yes"! I told her I will send her in a refill. She asked to switch her Pharmacy to [facility] [address] I said I will send her Rx there. Pt was appreciative

## 2013-03-04 ENCOUNTER — Encounter: Payer: Self-pay | Admitting: Internal Medicine

## 2013-03-12 ENCOUNTER — Encounter: Payer: Self-pay | Admitting: Internal Medicine

## 2013-03-12 ENCOUNTER — Ambulatory Visit (INDEPENDENT_AMBULATORY_CARE_PROVIDER_SITE_OTHER): Payer: 59 | Admitting: Internal Medicine

## 2013-03-12 VITALS — BP 131/85 | HR 76 | Temp 97.0°F | Resp 20 | Ht 66.5 in | Wt 206.7 lb

## 2013-03-12 DIAGNOSIS — H9209 Otalgia, unspecified ear: Secondary | ICD-10-CM | POA: Insufficient documentation

## 2013-03-12 DIAGNOSIS — I10 Essential (primary) hypertension: Secondary | ICD-10-CM

## 2013-03-12 DIAGNOSIS — E669 Obesity, unspecified: Secondary | ICD-10-CM | POA: Insufficient documentation

## 2013-03-12 DIAGNOSIS — K219 Gastro-esophageal reflux disease without esophagitis: Secondary | ICD-10-CM

## 2013-03-12 DIAGNOSIS — H9201 Otalgia, right ear: Secondary | ICD-10-CM

## 2013-03-12 HISTORY — DX: Otalgia, unspecified ear: H92.09

## 2013-03-12 LAB — GLUCOSE, CAPILLARY: Glucose-Capillary: 91 mg/dL (ref 70–99)

## 2013-03-12 NOTE — Assessment & Plan Note (Signed)
Patient with ear ache on right x 1 week.  No mastoid tenderness, no tenderness of TMJ.  TM wnl on exam.  Patient reports ear pain is improving and happens from time to time.  Recommended trying OTC allergy medication and return if ear ache continues or comes back.

## 2013-03-12 NOTE — Progress Notes (Signed)
[name], female   DOB: 1965/10/23, 47 y.o.   MRN: [id number]   Subjective:   [name] female   DOB: 1965-11-14 47 y.o.   MRN: [id number]  HPI: [name] is a 47 y.o. female with past medical history hypertension, GERD, diverticulosis. She presents today for regular followup of her high blood pressure. She stopped antihypertensive medications one year ago and has been controlled with diet and exercise. She monitors her blood pressure at home and reports her highest reading was 135/85. She currently takes Pepcid for her GERD and reports that is well-controlled. Her only complaint today is of right ear pain she reports started last week. She reports it is decreased today, and she has these ear pains up to once a month she reported that they started about 6 months ago. She reports their normal course is to get worse gradually and then eventually subside. She reports that the pain is using mild and it gets severe it causes a headache she'll take ibuprofen. She reports occasional drainage from her years and reports that she will clean the external aspect of her year with a Q-tip. In addition she reports it is usually painful to chew or swallow when her ear is hurting. She reports that her headaches are usually described as sharp and throbbing that they are on one side of her head associated with her ear pain. She denies any associated visual disturbance. She does note that one of her headaches may have also been caused by recent change and contact lenses.    Past Medical History  Diagnosis Date  . BPPV (benign paroxysmal positional vertigo) 05/07/11    Described as room spinning, notes 2 episodes prior to Oct 2012.  Plans to undergo vestibular rehab.  . Hypertension 05/07/11    with grade 2 diastolic dysfunction  . PVC (premature ventricular contraction) 05/07/11    incidentally seen on telemetry  . GERD (gastroesophageal reflux disease)   .  Diverticulosis    Current Outpatient Prescriptions  Medication Sig Dispense Refill  . Estrogens Conjugated (PREMARIN PO) Take by mouth.      . famotidine (PEPCID) 20 MG tablet Take 1 tablet (20 mg total) by mouth 2 (two) times daily.  60 tablet  3  . famotidine (PEPCID) 20 MG tablet Take 1 tablet (20 mg total) by mouth 2 (two) times daily.  60 tablet  3  . ibuprofen (ADVIL,MOTRIN) 200 MG tablet Take 400 mg by mouth every 6 (six) hours as needed for pain.      Marland Kitchen ibuprofen (ADVIL,MOTRIN) 800 MG tablet Take 1 tablet (800 mg total) by mouth 3 (three) times daily. As needed for pain  30 tablet  1  . meloxicam (MOBIC) 15 MG tablet Take 1 tablet (15 mg total) by mouth daily.  15 tablet  0  . methocarbamol (ROBAXIN) 500 MG tablet Take 1 tablet (500 mg total) by mouth 3 (three) times daily.  30 tablet  0  . Multiple Vitamin (MULITIVITAMIN WITH MINERALS) TABS Take 1 tablet by mouth daily.      . polyethylene glycol powder (GLYCOLAX/MIRALAX) powder       . traMADol (ULTRAM) 50 MG tablet Take 2 tablets (100 mg total) by mouth every 8 (eight) hours as needed for pain.  30 tablet  0   No current facility-administered medications for this visit.   Family History  Problem Relation Age of Onset  . Diabetes Mother   . Breast cancer Mother   .  Diabetes Maternal Grandmother   . Cervical cancer Maternal Aunt   . Deep vein thrombosis Father     died from cerebral embolus   History   Social History  . Marital Status: Single    Spouse Name: N/A    Number of Children: 2  . Years of Education: N/A   Occupational History  .     Social History Main Topics  . Smoking status: Former Smoker -- 0.10 packs/day for 20 years    Types: Cigarettes    Quit date: 07/09/2005  . Smokeless tobacco: Never Used     Comment: 1 black & mild a week  . Alcohol Use: Yes     Comment: occasional glass of wine  . Drug Use: No  . Sexual Activity: Yes    Birth Control/ Protection: None   Other Topics Concern  . None    Social History Narrative   Has two children and one granddaughter. Lives by herself.   Review of Systems: Review of Systems  Constitutional: Negative for fever, chills and malaise/fatigue.  HENT: Positive for ear pain (right side) and ear discharge (occasionally). Negative for nosebleeds, congestion, sore throat and tinnitus.   Eyes: Negative for blurred vision, double vision and discharge.  Respiratory: Negative for cough, sputum production, shortness of breath and wheezing.   Cardiovascular: Negative for chest pain and leg swelling.  Gastrointestinal: Positive for heartburn. Negative for nausea, vomiting, abdominal pain, diarrhea and constipation.  Genitourinary: Negative for dysuria.  Musculoskeletal: Negative for myalgias.  Skin: Negative for itching and rash.  Neurological: Positive for headaches (mild occasionally associated with ear pain.). Negative for dizziness, focal weakness and weakness.    Objective:  Physical Exam: Filed Vitals:   03/12/13 1444  BP: 131/85  Pulse: 76  Temp: 97 F (36.1 C)  TempSrc: Oral  Resp: 20  Height: 5' 6.5" (1.689 m)  Weight: 206 lb 11.2 oz (93.759 kg)   Physical Exam  Nursing note and vitals reviewed. Constitutional: She is well-developed, well-nourished, and in no distress. No distress.  HENT:  Head: Normocephalic and atraumatic.  Right Ear: Hearing, tympanic membrane, external ear and ear canal normal.  Left Ear: Hearing, tympanic membrane, external ear and ear canal normal.  Nose: Inferior turbinate hypertrophy  Eyes: Conjunctivae are normal.  Cardiovascular: Normal rate, regular rhythm, normal heart sounds and intact distal pulses.   No murmur heard. Pulmonary/Chest: Effort normal and breath sounds normal. No respiratory distress. She has no wheezes.  Abdominal: Soft. Bowel sounds are normal. There is no tenderness.  Musculoskeletal: She exhibits no edema.  Neurological: She is alert.  Skin: Skin is warm and dry. She is not  diaphoretic.    Assessment & Plan:  See problem based Assessment and Plan

## 2013-03-12 NOTE — Assessment & Plan Note (Signed)
BP Readings from Last 3 Encounters:  03/12/13 131/85  09/24/12 134/72  05/21/12 127/84    Lab Results  Component Value Date   NA 139 08/27/2011   K 3.8 08/27/2011   CREATININE 0.65 08/27/2011    Assessment: Blood pressure control: controlled Progress toward BP goal:  at goal Comments: off antihypertensive medications x 1 year.  Plan: Medications:  off antihypertensives x 1 year, controlled with diet and exercise. Educational resources provided:   Self management tools provided:   Other plans: Patient has had slow increase in weight, will try to increase exercise.  Will continue home monitoring of BP.

## 2013-03-12 NOTE — Assessment & Plan Note (Signed)
Takes Pepcid, continue current medications.

## 2013-03-12 NOTE — Patient Instructions (Signed)
1.  You are doing a great job with you blood pressure.  Continue what you are doing! 2.  Consider increasing exercise and monitoring diet to loose weight. 3.  Try OTC allergy medication like Claritin or Zyrtec, if your ear pain returns make a same day or next day appointment.

## 2013-03-12 NOTE — Assessment & Plan Note (Addendum)
Patient with slow weight gain since knee injury earlier this year.  Patient realizes weight gain and has started trying to uses stairs at work.  Reports knee is much improved and will try to increase exercise to loose weight. If patient continues to gain weight will consider checking TSH at next visit. Last TSH was normal 2 years ago. Fingerstick glucose was checked today and was 91.

## 2013-03-18 NOTE — Progress Notes (Signed)
I saw and evaluated the patient.  I personally confirmed the key portions of the history and exam documented by Dr. Hoffman and I reviewed pertinent patient test results.  The assessment, diagnosis, and plan were formulated together and I agree with the documentation in the resident's note. 

## 2013-06-11 ENCOUNTER — Encounter: Payer: 59 | Admitting: Internal Medicine

## 2013-06-22 ENCOUNTER — Ambulatory Visit: Payer: 59 | Admitting: Internal Medicine

## 2013-07-15 ENCOUNTER — Other Ambulatory Visit: Payer: Self-pay

## 2013-07-15 DIAGNOSIS — Z1231 Encounter for screening mammogram for malignant neoplasm of breast: Secondary | ICD-10-CM

## 2013-08-03 ENCOUNTER — Ambulatory Visit
Admission: RE | Admit: 2013-08-03 | Discharge: 2013-08-03 | Disposition: A | Discharge status: Home / Self Care | Payer: 59 | Source: Ambulatory Visit

## 2013-08-03 ENCOUNTER — Encounter: Payer: Self-pay | Admitting: Internal Medicine

## 2013-08-03 ENCOUNTER — Telehealth: Payer: Self-pay | Admitting: Internal Medicine

## 2013-08-03 ENCOUNTER — Other Ambulatory Visit (INDEPENDENT_AMBULATORY_CARE_PROVIDER_SITE_OTHER): Payer: 59

## 2013-08-03 ENCOUNTER — Ambulatory Visit (INDEPENDENT_AMBULATORY_CARE_PROVIDER_SITE_OTHER): Payer: 59 | Admitting: Internal Medicine

## 2013-08-03 VITALS — BP 116/88 | HR 80 | Temp 98.6°F | Ht 67.0 in | Wt 200.4 lb

## 2013-08-03 DIAGNOSIS — K5732 Diverticulitis of large intestine without perforation or abscess without bleeding: Secondary | ICD-10-CM

## 2013-08-03 DIAGNOSIS — K5792 Diverticulitis of intestine, part unspecified, without perforation or abscess without bleeding: Secondary | ICD-10-CM

## 2013-08-03 DIAGNOSIS — R197 Diarrhea, unspecified: Secondary | ICD-10-CM

## 2013-08-03 DIAGNOSIS — K219 Gastro-esophageal reflux disease without esophagitis: Secondary | ICD-10-CM

## 2013-08-03 DIAGNOSIS — K3189 Other diseases of stomach and duodenum: Secondary | ICD-10-CM

## 2013-08-03 DIAGNOSIS — R1013 Epigastric pain: Secondary | ICD-10-CM

## 2013-08-03 DIAGNOSIS — Z1231 Encounter for screening mammogram for malignant neoplasm of breast: Secondary | ICD-10-CM

## 2013-08-03 LAB — COMPREHENSIVE METABOLIC PANEL
ALT: 14 U/L (ref 0–35)
AST: 17 U/L (ref 0–37)
Albumin: 4.3 g/dL (ref 3.5–5.2)
Alkaline Phosphatase: 83 U/L (ref 39–117)
BILIRUBIN TOTAL: 0.4 mg/dL (ref 0.3–1.2)
BUN: 20 mg/dL (ref 6–23)
CO2: 24 meq/L (ref 19–32)
Calcium: 9.9 mg/dL (ref 8.4–10.5)
Chloride: 108 mEq/L (ref 96–112)
Creatinine, Ser: 0.8 mg/dL (ref 0.4–1.2)
GFR: 95.51 mL/min (ref >60.00)
GLUCOSE: 98 mg/dL (ref 70–99)
Potassium: 4.8 mEq/L (ref 3.5–5.1)
SODIUM: 140 meq/L (ref 135–145)
TOTAL PROTEIN: 8.1 g/dL (ref 6.0–8.3)

## 2013-08-03 LAB — CBC
HEMATOCRIT: 42.3 % (ref 36.0–46.0)
HEMOGLOBIN: 14.7 g/dL (ref 12.0–15.0)
MCHC: 34.7 g/dL (ref 30.0–36.0)
MCV: 94.9 fl (ref 78.0–100.0)
PLATELETS: 233 10*3/uL (ref 150.0–400.0)
RBC: 4.46 Mil/uL (ref 3.87–5.11)
RDW: 12.2 % (ref 11.5–14.6)
WBC: 10.7 10*3/uL — AB (ref 4.5–10.5)

## 2013-08-03 MED ORDER — METRONIDAZOLE 500 MG PO TABS: 500.0000 mg | ORAL_TABLET | Freq: Three times a day (TID) | ORAL | Status: DC

## 2013-08-03 MED ORDER — PANTOPRAZOLE SODIUM 40 MG PO TBEC: 40.0000 mg | DELAYED_RELEASE_TABLET | Freq: Two times a day (BID) | ORAL | Status: DC

## 2013-08-03 MED ORDER — ONDANSETRON 4 MG PO TBDP: 4.0000 mg | ORAL_TABLET | Freq: Three times a day (TID) | ORAL | Status: DC | PRN

## 2013-08-03 MED ORDER — CIPROFLOXACIN HCL 500 MG PO TABS: 500.0000 mg | ORAL_TABLET | Freq: Two times a day (BID) | ORAL | Status: DC

## 2013-08-03 NOTE — Progress Notes (Signed)
Subjective:    [name], female    DOB: January 19, 1966, 48 y.o.   MRN: [id number]  HPI [name] is a 48 year old female with a past medical history of GERD, hypertension, BPPV, diverticulitis who was last seen in 2013. She was initially seen to evaluate an episode of diverticulitis. She came for colonoscopy on 10/25/2011 which revealed a normal terminal ileum, mild left-sided diverticulosis and a sessile rectosigmoid polyp which was removed and found to be hyperplastic.  She reports recently worsening of daily heartburn. Previously she was using famotidine 20 mg twice daily but this is no longer helping her. She reports a burning sensation in the epigastrium waterbrash, regurgitation. She also reports frequent nausea and some vomiting. Emesis is nonbloody and nonbilious. She also endorses early satiety, which is a fairly new symptom for her. Her GERD is been somewhat long-standing but worse over the last month. No dysphagia or odynophagia. However over the last 5-6 days she has developed recurrent left lower quadrant abdominal pain. This is been associated with diarrhea and increased nausea with vomiting. Her diarrhea has occurred 3-6 times daily over the last 5 days. Stools described as watery, nonbloody and non-melenic. She tried loperamide to slow down the diarrhea which did help. She is also recently had a liquid antacid which helped only transiently. She reports low-grade fever at home. Initially she wondered if she had a GI "bug". She seen some scant red blood with wiping which he has attributed to known external hemorrhoid.  Review of Systems As per history of present illness, otherwise negative  Current Medications, Allergies, Past Medical History, Past Surgical History, Family History and Social History were reviewed in [employer] record.      Objective:   Physical Exam BP 116/88  Pulse 80  Temp(Src) 98.6 F (37 C)  Ht 5\' 7"  (1.702 m)  Wt  200 lb 6.4 oz (90.901 kg)  BMI 31.38 kg/m2 Constitutional: Well-developed and well-nourished. No distress. HEENT: Normocephalic and atraumatic. Oropharynx is clear and moist. No oropharyngeal exudate. Conjunctivae are normal.  No scleral icterus. Neck: Neck supple. Trachea midline. Cardiovascular: Normal rate, regular rhythm and intact distal pulses. No M/R/G Pulmonary/chest: Effort normal and breath sounds normal. No wheezing, rales or rhonchi. Abdominal: Soft, moderate mid and left-sided abdominal tenderness without rebound or guarding, nondistended. Bowel sounds active throughout.  Extremities: no clubbing, cyanosis, or edema Lymphadenopathy: No cervical adenopathy noted. Neurological: Alert and oriented to person place and time. Skin: Skin is warm and dry. No rashes noted. Psychiatric: Normal mood and affect. Behavior is normal.  Labs today, CBC, CMP, Gi pathogen panel, H pylori stool Ag    Assessment & Plan:  48 year old female with a past medical history of GERD, hypertension, BPPV, diverticulitis who was last seen in 2013, seen today for worsening reflux along with left lower quadrant abdominal pain, nausea vomiting with diarrhea  1.  GERD/early satiety/nausea/vomiting -- certainly it sounds like some of her current symptoms are reflux related. I also feel that her upper GI symptoms may be related to what is likely recurrent diverticulitis. I have recommended initiation of PPI therapy with pantoprazole 40 mg twice daily. I would like her to stay on this medication twice daily for 2-3 week, and then if improving decrease to once daily. I will give her a prescription for ondansetron to be used as needed and as directed for nausea. I will check an H. pylori stool antigen before she starts PPI. If symptoms fail to resolve  completely with treatment, she will need an upper endoscopy. She understands this recommendation. BRAT diet for now.  2.  LLQ pain/hx of diverticulitis -- clinically she has  left lower quadrant tenderness and given her history of diverticulitis and known left-sided diverticulosis, I feel that this is most likely recurrent diverticulitis. I will check a GI pathogen panel given her diarrhea. I will start her on ciprofloxacin 500 mg twice daily and metronidazole 500 mg 3 times daily today for 10 days. Brat diet.    Return in 4-6 weeks, sooner if worsening.

## 2013-08-03 NOTE — Telephone Encounter (Signed)
Sent in pantoprazole to pt's pharmacy

## 2013-08-03 NOTE — Patient Instructions (Addendum)
Your physician has requested that you go to the basement for  lab work before leaving today.   We have sent the following medications to your pharmacy for you to pick up at your convenience: Cipro and Flagyl please take as directed.  pantoprazole take twice a day  Diet for Diarrhea, Adult Frequent, runny stools (diarrhea) may be caused or worsened by food or drink. Diarrhea may be relieved by changing your diet. Since diarrhea can last up to 7 days, it is easy for you to lose too much fluid from the body and become dehydrated. Fluids that are lost need to be replaced. Along with a modified diet, make sure you drink enough fluids to keep your urine clear or pale yellow. DIET INSTRUCTIONS  Ensure adequate fluid intake (hydration): have 1 cup (8 oz) of fluid for each diarrhea episode. Avoid fluids that contain simple sugars or sports drinks, fruit juices, whole milk products, and sodas. Your urine should be clear or pale yellow if you are drinking enough fluids. Hydrate with an oral rehydration solution that you can purchase at pharmacies, retail stores, and online. You can prepare an oral rehydration solution at home by mixing the following ingredients together:    tsp table salt.   tsp baking soda.   tsp salt substitute containing potassium chloride.  1  tablespoons sugar.  1 L (34 oz) of water.  Certain foods and beverages may increase the speed at which food moves through the gastrointestinal (GI) tract. These foods and beverages should be avoided and include:  Caffeinated and alcoholic beverages.  High-fiber foods, such as raw fruits and vegetables, nuts, seeds, and whole grain breads and cereals.  Foods and beverages sweetened with sugar alcohols, such as xylitol, sorbitol, and mannitol.  Some foods may be well tolerated and may help thicken stool including:  Starchy foods, such as rice, toast, pasta, low-sugar cereal, oatmeal, grits, baked potatoes, crackers, and bagels.    Bananas.   Applesauce.  Add probiotic-rich foods to help increase healthy bacteria in the GI tract, such as yogurt and fermented milk products. RECOMMENDED FOODS AND BEVERAGES Starches Choose foods with less than 2 g of fiber per serving.  Recommended:  White, [place], and pita breads, plain rolls, buns, bagels. Plain muffins, matzo. Soda, saltine, or graham crackers. Pretzels, melba toast, zwieback. Cooked cereals made with water: cornmeal, farina, cream cereals. Dry cereals: refined corn, wheat, rice. Potatoes prepared any way without skins, refined macaroni, spaghetti, noodles, refined rice.  Avoid:  Bread, rolls, or crackers made with whole wheat, multi-grains, rye, bran seeds, nuts, or coconut. Corn tortillas or taco shells. Cereals containing whole grains, multi-grains, bran, coconut, nuts, raisins. Cooked or dry oatmeal. Coarse wheat cereals, granola. Cereals advertised as "high-fiber." Potato skins. Whole grain pasta, wild or brown rice. Popcorn. Sweet potatoes, yams. Sweet rolls, doughnuts, waffles, pancakes, sweet breads. Vegetables  Recommended: Strained tomato and vegetable juices. Most well-cooked and canned vegetables without seeds. Fresh: Tender lettuce, cucumber without the skin, cabbage, spinach, bean sprouts.  Avoid: Fresh, cooked, or canned: Artichokes, baked beans, beet greens, broccoli, Brussels sprouts, corn, kale, legumes, peas, sweet potatoes. Cooked: Green or red cabbage, spinach. Avoid large servings of any vegetables because vegetables shrink when cooked, and they contain more fiber per serving than fresh vegetables. Fruit  Recommended: Cooked or canned: Apricots, applesauce, cantaloupe, cherries, fruit cocktail, grapefruit, grapes, kiwi, mandarin oranges, peaches, pears, plums, watermelon. Fresh: Apples without skin, ripe banana, grapes, cantaloupe, cherries, grapefruit, peaches, oranges, plums. Keep servings limited to  cup or 1 piece.  Avoid: Fresh: Apples  with skin, apricots, mangoes, pears, raspberries, strawberries. Prune juice, stewed or dried prunes. Dried fruits, raisins, dates. Large servings of all fresh fruits. Protein  Recommended: Ground or well-cooked tender beef, ham, veal, lamb, pork, or poultry. Eggs. Fish, oysters, shrimp, lobster, other seafoods. Liver, organ meats.  Avoid: Tough, fibrous meats with gristle. Peanut butter, smooth or chunky. Cheese, nuts, seeds, legumes, dried peas, beans, lentils. Dairy  Recommended: Yogurt, lactose-free milk, kefir, drinkable yogurt, buttermilk, soy milk, or plain hard cheese.  Avoid: Milk, chocolate milk, beverages made with milk, such as milkshakes. Soups  Recommended: Bouillon, broth, or soups made from allowed foods. Any strained soup.  Avoid: Soups made from vegetables that are not allowed, cream or milk-based soups. Desserts and Sweets  Recommended: Sugar-free gelatin, sugar-free frozen ice pops made without sugar alcohol.  Avoid: Plain cakes and cookies, pie made with fruit, pudding, custard, cream pie. Gelatin, fruit, ice, sherbet, frozen ice pops. Ice cream, ice milk without nuts. Plain hard candy, honey, jelly, molasses, syrup, sugar, chocolate syrup, gumdrops, marshmallows. Fats and Oils  Recommended: Limit fats to less than 8 tsp per day.  Avoid: Seeds, nuts, olives, avocados. Margarine, butter, cream, mayonnaise, salad oils, plain salad dressings. Plain gravy, crisp bacon without rind. Beverages  Recommended: Water, decaffeinated teas, oral rehydration solutions, sugar-free beverages not sweetened with sugar alcohols.  Avoid: Fruit juices, caffeinated beverages (coffee, tea, soda), alcohol, sports drinks, or lemon-lime soda. Condiments  Recommended: Ketchup, mustard, horseradish, vinegar, cocoa powder. Spices in moderation: allspice, basil, bay leaves, celery powder or leaves, cinnamon, cumin powder, curry powder, ginger, mace, marjoram, onion or garlic powder, oregano,  paprika, parsley flakes, ground pepper, rosemary, sage, savory, tarragon, thyme, turmeric.  Avoid: Coconut, honey. Document Released: 09/15/2003 Document Revised: 03/19/2012 Document Reviewed: 11/09/2011 [facility] Patient Information 2014 [place].                                               We are excited to introduce MyChart, a new best-in-class service that provides you online access to important information in your electronic medical record. We want to make it easier for you to view your health information - all in one secure location - when and where you need it. We expect MyChart will enhance the quality of care and service we provide.  When you register for MyChart, you can:    View your test results.    Request appointments and receive appointment reminders via email.    Request medication renewals.    View your medical history, allergies, medications and immunizations.    Communicate with your physician's office through a password-protected site.    Conveniently print information such as your medication lists.  To find out if MyChart is right for you, please talk to a member of our clinical staff today. We will gladly answer your questions about this free health and wellness tool.  If you are age 56 or older and want a member of your family to have access to your record, you must provide written consent by completing a proxy form available at our office. Please speak to our clinical staff about guidelines regarding accounts for patients younger than age 36.  As you activate your MyChart account and need any technical assistance, please call the MyChart technical support line at [phone]) or email your  question to [email or web address]. If you email your question(s), please include your name, a return phone number and the best time to reach you.  If you have non-urgent health-related questions, you can send a message to our office through [place]  at [email or web address]. If you have a medical emergency, call 911.  Thank you for using MyChart as your new health and wellness resource!   MyChart licensed from [employer],  1999-2010. Patents Pending.

## 2013-09-18 ENCOUNTER — Encounter: Payer: Self-pay | Admitting: Internal Medicine

## 2013-09-18 ENCOUNTER — Ambulatory Visit (INDEPENDENT_AMBULATORY_CARE_PROVIDER_SITE_OTHER): Payer: 59 | Admitting: Internal Medicine

## 2013-09-18 ENCOUNTER — Ambulatory Visit (HOSPITAL_COMMUNITY)
Admission: RE | Admit: 2013-09-18 | Discharge: 2013-09-18 | Disposition: A | Discharge status: Home / Self Care | Payer: 59 | Source: Ambulatory Visit | Attending: Internal Medicine | Admitting: Internal Medicine

## 2013-09-18 VITALS — BP 139/86 | HR 61 | Temp 97.5°F | Ht 66.5 in | Wt 204.5 lb

## 2013-09-18 DIAGNOSIS — I10 Essential (primary) hypertension: Secondary | ICD-10-CM

## 2013-09-18 DIAGNOSIS — R002 Palpitations: Secondary | ICD-10-CM | POA: Insufficient documentation

## 2013-09-18 DIAGNOSIS — N951 Menopausal and female climacteric states: Secondary | ICD-10-CM

## 2013-09-18 LAB — BASIC METABOLIC PANEL WITH GFR
BUN: 15 mg/dL (ref 6–23)
CALCIUM: 10.4 mg/dL (ref 8.4–10.5)
CO2: 26 meq/L (ref 19–32)
Chloride: 105 mEq/L (ref 96–112)
Creat: 0.79 mg/dL (ref 0.50–1.10)
GFR, Est Non African American: 89 mL/min
Glucose, Bld: 75 mg/dL (ref 70–99)
Potassium: 4.3 mEq/L (ref 3.5–5.3)
SODIUM: 138 meq/L (ref 135–145)

## 2013-09-18 LAB — CBC
HCT: 39.6 % (ref 36.0–46.0)
Hemoglobin: 13.6 g/dL (ref 12.0–15.0)
MCH: 32 pg (ref 26.0–34.0)
MCHC: 34.3 g/dL (ref 30.0–36.0)
MCV: 93.2 fL (ref 78.0–100.0)
PLATELETS: 293 10*3/uL (ref 150–400)
RBC: 4.25 MIL/uL (ref 3.87–5.11)
RDW: 12.8 % (ref 11.5–15.5)
WBC: 9.3 10*3/uL (ref 4.0–10.5)

## 2013-09-18 NOTE — Progress Notes (Addendum)
   Subjective:    [name], female    DOB: 03-25-1966, 48 y.o.   MRN: [id number]  HPI  Presents for bp recheck.  Had been taken off of bp med and reports that she has felt palpitations at times and seeing spots in her visual fields.  Also some mild constipation and has recently been put back on Premarin last Feb 2015 by Dr. Joylene Grapes with recent increase a week or so ago. States that her reflux has improved since switching from famotidine to Protonix. Previous on HCTZ until admission for diverticulitis last Feb 2013. Hx significant for BPPV, PVC's, GERD, and recent sigmoid diverticulitis.  Review of Systems  Constitutional: Negative.   Eyes: Positive for visual disturbance.  Respiratory: Negative.   Cardiovascular: Positive for palpitations.  Endocrine: Positive for polydipsia and polyuria.  Genitourinary: Negative for dysuria, hematuria, vaginal bleeding and vaginal discharge.  Neurological: Positive for light-headedness and headaches. Negative for weakness and numbness.  Psychiatric/Behavioral: Negative.        Objective:   Physical Exam  Constitutional: She is oriented to person, place, and time. She appears well-developed and well-nourished. No distress.  HENT:  Head: Normocephalic and atraumatic.  Eyes: Conjunctivae and EOM are normal. Pupils are equal, round, and reactive to light.  Neck: Normal range of motion. Neck supple. No thyromegaly present.  Cardiovascular: Normal rate, regular rhythm, normal heart sounds and intact distal pulses.   Pulmonary/Chest: Effort normal and breath sounds normal.  Abdominal: Soft. Bowel sounds are normal.  Musculoskeletal: Normal range of motion. She exhibits no edema and no tenderness.  Neurological: She is alert and oriented to person, place, and time.  Skin: Skin is warm and dry.  Psychiatric: She has a normal mood and affect.          Assessment & Plan:  See separate problem-list charting:  ADDENDUM: TSH  within and all electrolytes within normal limits.  Will resume HCTZ at 12.5 mg qd.

## 2013-09-18 NOTE — Assessment & Plan Note (Signed)
BP Readings from Last 3 Encounters:  09/18/13 139/86  08/03/13 116/88  03/12/13 131/85    Lab Results  Component Value Date   NA 140 08/03/2013   K 4.8 08/03/2013   CREATININE 0.8 08/03/2013    Assessment: Blood pressure control: controlled Progress toward BP goal:  at goal Comments: states spb >140 several times and associated with mild headache and palpitations, had previously been on HCTZ a year ago which was d/c on hospital discharge  Plan: Medications:  Currently diet controlled Educational resources provided:   Self management tools provided:   Other plans: will check TSH and if within normal limits will resume HCTZ at 12.5 mg qd

## 2013-09-18 NOTE — Patient Instructions (Addendum)
General Instructions:  Your EKG of your heart is normal. We will check your thyroid blood work today and call you with the results by Monday.   Treatment Goals:  Goals (1 Years of Data) as of 09/18/13         As of Today 08/03/13 03/12/13 09/24/12 05/21/12     Blood Pressure    . Blood Pressure < 140/90  139/86 116/88 131/85 134/72 127/84      Progress Toward Treatment Goals:  Treatment Goal 09/18/2013  Blood pressure at goal    Self Care Goals & Plans:  Self Care Goal 03/12/2013  Monitor my health keep track of my blood pressure  Be physically active find an activity I enjoy; take the stairs instead of the elevator  Meeting treatment goals maintain the current self-care plan    No flowsheet data found.   Care Management & Community Referrals:  No flowsheet data found.

## 2013-09-18 NOTE — Assessment & Plan Note (Addendum)
EKG--> NSR no ectopic beats -check TSH and Free T4 -check CBC for anemia -check electrolytes

## 2013-09-18 NOTE — Assessment & Plan Note (Signed)
States recently started on Premarin again this past Feb 2015 by GYN Dr. Lenna Sciara. [name]

## 2013-09-19 LAB — TSH: TSH: 1.06 u[IU]/mL (ref 0.350–4.500)

## 2013-09-19 LAB — T4, FREE: FREE T4: 1.26 ng/dL (ref 0.80–1.80)

## 2013-09-23 ENCOUNTER — Telehealth: Payer: Self-pay | Admitting: *Deleted

## 2013-09-23 MED ORDER — HYDROCHLOROTHIAZIDE 12.5 MG PO TABS: 12.5000 mg | ORAL_TABLET | Freq: Every day | ORAL | Status: DC

## 2013-09-23 NOTE — Addendum Note (Signed)
Addended by: Jeralene Huff on: 09/23/2013 01:28 PM   Modules accepted: Orders

## 2013-09-23 NOTE — Telephone Encounter (Signed)
Pt was seen in office on Friday 09/18/13.  She had labs for thyroid levels done and was expecting a call on Monday with her results. Please call pt @ [phone] or [phone]

## 2013-09-24 NOTE — Progress Notes (Signed)
Case discussed with Dr. Schooler at the time of the visit.  We reviewed the resident's history and exam and pertinent patient test results.  I agree with the assessment, diagnosis, and plan of care documented in the resident's note.     

## 2013-09-25 ENCOUNTER — Telehealth: Payer: Self-pay | Admitting: *Deleted

## 2013-09-25 NOTE — Telephone Encounter (Signed)
Call to pt informed per order of Dr. Michail Sermon that her Kidney and Thyroid test were normal. Pt was informed that she is to start her HCTZ at 12.5 mg.  Pt voiced understanding of the instructions.  Sander Nephew, RN 09/25/2013 2:26 PM.

## 2013-09-28 NOTE — Telephone Encounter (Signed)
Pt has been called

## 2014-02-19 ENCOUNTER — Other Ambulatory Visit: Payer: Self-pay | Admitting: *Deleted

## 2014-02-19 NOTE — Telephone Encounter (Signed)
90 day supply, please

## 2014-02-22 MED ORDER — HYDROCHLOROTHIAZIDE 12.5 MG PO TABS: 12.5000 mg | ORAL_TABLET | Freq: Every day | ORAL | Status: DC

## 2014-07-29 ENCOUNTER — Encounter: Payer: Self-pay | Admitting: Internal Medicine

## 2014-08-04 ENCOUNTER — Ambulatory Visit (INDEPENDENT_AMBULATORY_CARE_PROVIDER_SITE_OTHER): Payer: 59 | Admitting: Internal Medicine

## 2014-08-04 ENCOUNTER — Encounter: Payer: Self-pay | Admitting: Internal Medicine

## 2014-08-04 VITALS — BP 128/78 | HR 78 | Temp 97.2°F | Ht 66.5 in | Wt 213.7 lb

## 2014-08-04 DIAGNOSIS — M25819 Other specified joint disorders, unspecified shoulder: Secondary | ICD-10-CM | POA: Diagnosis not present

## 2014-08-04 DIAGNOSIS — K219 Gastro-esophageal reflux disease without esophagitis: Secondary | ICD-10-CM

## 2014-08-04 DIAGNOSIS — I10 Essential (primary) hypertension: Secondary | ICD-10-CM | POA: Diagnosis not present

## 2014-08-04 DIAGNOSIS — Z0000 Encounter for general adult medical examination without abnormal findings: Secondary | ICD-10-CM | POA: Diagnosis not present

## 2014-08-04 DIAGNOSIS — E669 Obesity, unspecified: Secondary | ICD-10-CM

## 2014-08-04 NOTE — Assessment & Plan Note (Signed)
-   Discussed flu vaccine>> declined -Discussed Tdap>>> declined. - had colonoscopy in 2013. - Has appointment with Dr. Butler Denmark in Feb for mammogram and pap screening.

## 2014-08-04 NOTE — Assessment & Plan Note (Signed)
-   Discussed weight gain.  Patient has joined gym and notes she has already lost 4 lbs.  I congratulated her and asked that she keep it up.

## 2014-08-04 NOTE — Assessment & Plan Note (Signed)
BP Readings from Last 3 Encounters:  08/04/14 128/78  09/18/13 139/86  08/03/13 116/88    Lab Results  Component Value Date   NA 138 09/18/2013   K 4.3 09/18/2013   CREATININE 0.79 09/18/2013    Assessment: Blood pressure control: controlled Progress toward BP goal:  at goal Comments: weight up 8 lbs but exercising more, off HCTZ  Plan: Medications:  none Educational resources provided: brochure, handout Self management tools provided:   Other plans: patient will continue to monitor BP at home.

## 2014-08-04 NOTE — Progress Notes (Signed)
Subjective:   [name] female   DOB: 1966/02/17 49 y.o.   MRN: [id number]  HPI: [name] is a 49 y.o. female with past medical history hypertension, GERD, diverticulosis. She presents today for regular followup of her high blood pressure. She was resumed on low dose HCTZ at her last visit in march due to some home readings of hypertension. She reports that since that time she has been monitoring her BP and after it was well controlled for a few months and she started to exercise more she went off of HCTZ and has remained well controlled.  She is concerning about some swelling on her right shoulder.   She first noticed the area about 1 year ago, since then it grew some in size.  She notes that it was previously more firm than it is now and that in general it is soft.  It is mildly tender to palpation but does not affect her movement of her shoulder For other HPI details please see A&P below.   Past Medical History  Diagnosis Date  . BPPV (benign paroxysmal positional vertigo) 05/07/11    Described as room spinning, notes 2 episodes prior to Oct 2012.  Plans to undergo vestibular rehab.  . Hypertension 05/07/11    with grade 2 diastolic dysfunction  . PVC (premature ventricular contraction) 05/07/11    incidentally seen on telemetry  . GERD (gastroesophageal reflux disease)   . Diverticulosis   . Hyperplastic colon polyp    Current Outpatient Prescriptions  Medication Sig Dispense Refill  . Multiple Vitamin (MULITIVITAMIN WITH MINERALS) TABS Take 1 tablet by mouth daily.    Marland Kitchen PREMARIN 1.25 MG tablet      No current facility-administered medications for this visit.   Family History  Problem Relation Age of Onset  . Diabetes Mother   . Breast cancer Mother   . Diabetes Maternal Grandmother   . Cervical cancer Maternal Aunt   . Deep vein thrombosis Father     died from cerebral embolus  . Colon cancer Neg Hx    History   Social History  .  Marital Status: Single    Spouse Name: N/A    Number of Children: 2  . Years of Education: N/A   Occupational History  .     Social History Main Topics  . Smoking status: Former Smoker -- 0.10 packs/day for 20 years    Types: Cigarettes    Quit date: 07/09/2005  . Smokeless tobacco: Never Used     Comment: 1 black & mild a week  . Alcohol Use: No  . Drug Use: No  . Sexual Activity: Yes    Birth Control/ Protection: None   Other Topics Concern  . None   Social History Narrative   Has two children and one granddaughter. Lives by herself.   Review of Systems: Review of Systems  Constitutional: Negative for fever, chills and weight loss (weight gain).  HENT: Negative for ear pain.   Eyes: Negative for blurred vision.  Respiratory: Negative for cough and shortness of breath.   Cardiovascular: Negative for chest pain and leg swelling.  Gastrointestinal: Negative for abdominal pain.  Genitourinary: Negative for dysuria.  Musculoskeletal: Negative for myalgias and joint pain.  Skin: Negative for rash.  Neurological: Negative for dizziness and headaches.    Objective:  Physical Exam: Filed Vitals:   08/04/14 1532  BP: 128/78  Pulse: 78  Temp: 97.2 F (36.2 C)  TempSrc: Oral  Height: 5' 6.5" (1.689 m)  Weight: 213 lb 11.2 oz (96.934 kg)  SpO2: 97%   Physical Exam  Constitutional: She is well-developed, well-nourished, and in no distress.  Cardiovascular: Normal rate, regular rhythm and normal heart sounds.   Pulmonary/Chest: Effort normal and breath sounds normal. She has no wheezes.  Abdominal: Soft. Bowel sounds are normal.  Musculoskeletal:  Half dollar size swelling of the anterior aspect of her a/c joint, mildly tender, no warmth or redness.  Right shoulder will full active ROM  Nursing note and vitals reviewed.   Wt Readings from Last 5 Encounters:  08/04/14 213 lb 11.2 oz (96.934 kg)  09/18/13 204 lb 8 oz (92.761 kg)  08/03/13 200 lb 6.4 oz (90.901 kg)    03/12/13 206 lb 11.2 oz (93.759 kg)  10/25/11 194 lb (87.998 kg)    Assessment & Plan:  Case discussed with Dr. Lynnae January  GERD (gastroesophageal reflux disease) - Off protonix and Pepcid, no heartburn.   Hypertension BP Readings from Last 3 Encounters:  08/04/14 128/78  09/18/13 139/86  08/03/13 116/88    Lab Results  Component Value Date   NA 138 09/18/2013   K 4.3 09/18/2013   CREATININE 0.79 09/18/2013    Assessment: Blood pressure control: controlled Progress toward BP goal:  at goal Comments: weight up 8 lbs but exercising more, off HCTZ  Plan: Medications:  none Educational resources provided: brochure, handout Self management tools provided:   Other plans: patient will continue to monitor BP at home.    Cyst of joint of shoulder Patient has half dollar size soft tissue swelling over her right shoulder that is mildly tender to palpation. She does not have tenderness with active ROM and has full ROM.  Unclear if this is a subcutaneous cyst in the soft tissue or could be communicating with the joint.  I discussed sending her for an U/S by radiology versus a referral to sports medicine who could U/S the joint in their office.  We decided to refer her over the sports medicine for eval and treatment. - Referral placed to sports medicine.   Preventative health care - Discussed flu vaccine>> declined -Discussed Tdap>>> declined. - had colonoscopy in 2013. - Has appointment with Dr. Butler Denmark in Feb for mammogram and pap screening.   Obesity (BMI 30-39.9) - Discussed weight gain.  Patient has joined gym and notes she has already lost 4 lbs.  I congratulated her and asked that she keep it up.

## 2014-08-04 NOTE — Assessment & Plan Note (Signed)
-   Off protonix and Pepcid, no heartburn.

## 2014-08-04 NOTE — Patient Instructions (Signed)
General Instructions: We will call with the appointment with Sports Medicine.  Please bring your medicines with you each time you come to clinic.  Medicines may include prescription medications, over-the-counter medications, herbal remedies, eye drops, vitamins, or other pills.   Progress Toward Treatment Goals:  Treatment Goal 08/04/2014  Blood pressure at goal    Self Care Goals & Plans:  Self Care Goal 08/04/2014  Manage my medications take my medicines as prescribed; bring my medications to every visit; refill my medications on time; follow the sick day instructions if I am sick  Monitor my health keep track of my blood pressure; keep track of my weight  Eat healthy foods eat more vegetables; eat fruit for snacks and desserts; eat baked foods instead of fried foods; eat foods that are low in salt; eat smaller portions  Be physically active find an activity I enjoy  Meeting treatment goals -    No flowsheet data found.   Care Management & Community Referrals:  Referral 08/04/2014  Referrals made for care management support none needed

## 2014-08-04 NOTE — Assessment & Plan Note (Signed)
Patient has half dollar size soft tissue swelling over her right shoulder that is mildly tender to palpation. She does not have tenderness with active ROM and has full ROM.  Unclear if this is a subcutaneous cyst in the soft tissue or could be communicating with the joint.  I discussed sending her for an U/S by radiology versus a referral to sports medicine who could U/S the joint in their office.  We decided to refer her over the sports medicine for eval and treatment. - Referral placed to sports medicine.

## 2014-08-05 NOTE — Progress Notes (Signed)
Internal Medicine Clinic Attending  I saw and evaluated the patient.  I personally confirmed the key portions of the history and exam documented by Dr. Hoffman and I reviewed pertinent patient test results.  The assessment, diagnosis, and plan were formulated together and I agree with the documentation in the resident's note.      

## 2014-08-24 ENCOUNTER — Ambulatory Visit: Payer: 59 | Admitting: Sports Medicine

## 2014-08-24 NOTE — Addendum Note (Signed)
Addended by: Hulan Fray on: 08/24/2014 06:16 PM   Modules accepted: Orders

## 2015-05-06 ENCOUNTER — Encounter (HOSPITAL_COMMUNITY): Payer: Self-pay | Admitting: *Deleted

## 2015-05-06 ENCOUNTER — Encounter (HOSPITAL_COMMUNITY): Payer: Self-pay | Admitting: Emergency Medicine

## 2015-05-06 ENCOUNTER — Emergency Department (INDEPENDENT_AMBULATORY_CARE_PROVIDER_SITE_OTHER)
Admission: EM | Admit: 2015-05-06 | Discharge: 2015-05-06 | Disposition: A | Discharge status: Home / Self Care | Payer: 59 | Source: Home / Self Care | Attending: Family Medicine | Admitting: Family Medicine

## 2015-05-06 ENCOUNTER — Emergency Department (HOSPITAL_COMMUNITY): Payer: 59

## 2015-05-06 ENCOUNTER — Telehealth: Payer: Self-pay | Admitting: Internal Medicine

## 2015-05-06 ENCOUNTER — Emergency Department (HOSPITAL_COMMUNITY)
Admission: EM | Admit: 2015-05-06 | Discharge: 2015-05-06 | Disposition: A | Discharge status: Home / Self Care | Payer: 59 | Attending: Emergency Medicine | Admitting: Emergency Medicine

## 2015-05-06 DIAGNOSIS — I10 Essential (primary) hypertension: Secondary | ICD-10-CM | POA: Diagnosis not present

## 2015-05-06 DIAGNOSIS — Z79899 Other long term (current) drug therapy: Secondary | ICD-10-CM | POA: Insufficient documentation

## 2015-05-06 DIAGNOSIS — K5792 Diverticulitis of intestine, part unspecified, without perforation or abscess without bleeding: Secondary | ICD-10-CM | POA: Diagnosis not present

## 2015-05-06 DIAGNOSIS — Z8669 Personal history of other diseases of the nervous system and sense organs: Secondary | ICD-10-CM | POA: Insufficient documentation

## 2015-05-06 DIAGNOSIS — Z86010 Personal history of colonic polyps: Secondary | ICD-10-CM | POA: Insufficient documentation

## 2015-05-06 DIAGNOSIS — R1032 Left lower quadrant pain: Secondary | ICD-10-CM

## 2015-05-06 DIAGNOSIS — Z87891 Personal history of nicotine dependence: Secondary | ICD-10-CM | POA: Insufficient documentation

## 2015-05-06 DIAGNOSIS — K5732 Diverticulitis of large intestine without perforation or abscess without bleeding: Secondary | ICD-10-CM | POA: Diagnosis not present

## 2015-05-06 LAB — CBC
HEMATOCRIT: 37.6 % (ref 36.0–46.0)
HEMOGLOBIN: 12.7 g/dL (ref 12.0–15.0)
MCH: 30.6 pg (ref 26.0–34.0)
MCHC: 33.8 g/dL (ref 30.0–36.0)
MCV: 90.6 fL (ref 78.0–100.0)
PLATELETS: 260 10*3/uL (ref 150–400)
RBC: 4.15 MIL/uL (ref 3.87–5.11)
RDW: 11.9 % (ref 11.5–15.5)
WBC: 10.6 10*3/uL — ABNORMAL HIGH (ref 4.0–10.5)

## 2015-05-06 LAB — COMPREHENSIVE METABOLIC PANEL
ALK PHOS: 88 U/L (ref 38–126)
ALT: 14 U/L (ref 14–54)
ANION GAP: 9 (ref 5–15)
AST: 18 U/L (ref 15–41)
Albumin: 4.1 g/dL (ref 3.5–5.0)
BILIRUBIN TOTAL: 0.8 mg/dL (ref 0.3–1.2)
BUN: 10 mg/dL (ref 6–20)
CALCIUM: 9.7 mg/dL (ref 8.9–10.3)
CO2: 24 mmol/L (ref 22–32)
Chloride: 102 mmol/L (ref 101–111)
Creatinine, Ser: 0.73 mg/dL (ref 0.44–1.00)
GFR calc non Af Amer: >60 mL/min (ref >60)
Glucose, Bld: 96 mg/dL (ref 65–99)
POTASSIUM: 3.4 mmol/L — AB (ref 3.5–5.1)
SODIUM: 135 mmol/L (ref 135–145)
TOTAL PROTEIN: 7.3 g/dL (ref 6.5–8.1)

## 2015-05-06 LAB — URINALYSIS, ROUTINE W REFLEX MICROSCOPIC
BILIRUBIN URINE: NEGATIVE
GLUCOSE, UA: NEGATIVE mg/dL
HGB URINE DIPSTICK: NEGATIVE
KETONES UR: NEGATIVE mg/dL
Leukocytes, UA: NEGATIVE
NITRITE: NEGATIVE
PH: 7 (ref 5.0–8.0)
Protein, ur: NEGATIVE mg/dL
Specific Gravity, Urine: 1.009 (ref 1.005–1.030)
Urobilinogen, UA: 0.2 mg/dL (ref 0.0–1.0)

## 2015-05-06 LAB — POC OCCULT BLOOD, ED: Fecal Occult Bld: NEGATIVE

## 2015-05-06 LAB — LIPASE, BLOOD: Lipase: 21 U/L (ref 11–51)

## 2015-05-06 MED ORDER — IOHEXOL 300 MG/ML  SOLN
100.0000 mL | Freq: Once | INTRAMUSCULAR | Status: AC | PRN
  Administered 2015-05-06: 100 mL via INTRAVENOUS

## 2015-05-06 MED ORDER — ONDANSETRON HCL 4 MG/2ML IJ SOLN
4.0000 mg | Freq: Once | INTRAMUSCULAR | Status: AC
  Administered 2015-05-06: 4 mg via INTRAVENOUS
  Filled 2015-05-06: qty 2

## 2015-05-06 MED ORDER — CIPROFLOXACIN HCL 500 MG PO TABS
500.0000 mg | ORAL_TABLET | Freq: Once | ORAL | Status: AC
  Administered 2015-05-06: 500 mg via ORAL
  Filled 2015-05-06: qty 1

## 2015-05-06 MED ORDER — CIPROFLOXACIN IN D5W 400 MG/200ML IV SOLN: 400.0000 mg | Freq: Once | INTRAVENOUS | Status: DC

## 2015-05-06 MED ORDER — FENTANYL CITRATE (PF) 100 MCG/2ML IJ SOLN
25.0000 ug | Freq: Once | INTRAMUSCULAR | Status: AC
  Administered 2015-05-06: 25 ug via INTRAVENOUS
  Filled 2015-05-06: qty 2

## 2015-05-06 MED ORDER — CIPROFLOXACIN HCL 500 MG PO TABS: 500.0000 mg | ORAL_TABLET | Freq: Two times a day (BID) | ORAL | Status: DC

## 2015-05-06 MED ORDER — METRONIDAZOLE 500 MG PO TABS: 500.0000 mg | ORAL_TABLET | Freq: Two times a day (BID) | ORAL | Status: DC

## 2015-05-06 MED ORDER — ONDANSETRON HCL 4 MG PO TABS: 4.0000 mg | ORAL_TABLET | Freq: Four times a day (QID) | ORAL | Status: DC

## 2015-05-06 MED ORDER — HYDROCODONE-ACETAMINOPHEN 5-325 MG PO TABS: 1.0000 | ORAL_TABLET | ORAL | Status: DC | PRN

## 2015-05-06 MED ORDER — METRONIDAZOLE 500 MG PO TABS
500.0000 mg | ORAL_TABLET | Freq: Once | ORAL | Status: AC
  Administered 2015-05-06: 500 mg via ORAL
  Filled 2015-05-06: qty 1

## 2015-05-06 MED ORDER — SODIUM CHLORIDE 0.9 % IV BOLUS (SEPSIS)
1000.0000 mL | Freq: Once | INTRAVENOUS | Status: AC
  Administered 2015-05-06: 1000 mL via INTRAVENOUS

## 2015-05-06 MED ORDER — METRONIDAZOLE IN NACL 5-0.79 MG/ML-% IV SOLN: 500.0000 mg | Freq: Once | INTRAVENOUS | Status: DC

## 2015-05-06 NOTE — ED Notes (Signed)
Pt requests PO meds instead of IV meds.  Dr. Darl Householder notified of same and verbal orders received to change meds.

## 2015-05-06 NOTE — ED Notes (Signed)
Patient c/o "diverticulitis"-seen by provider prior to this nurse

## 2015-05-06 NOTE — ED Provider Notes (Signed)
CSN: [id number]     Arrival date & time 05/06/15  1300 History   First MD Initiated Contact with Patient 05/06/15 1311     Chief Complaint  Patient presents with  . Diverticulitis   (Consider location/radiation/quality/duration/timing/severity/associated sxs/prior Treatment) HPI   [name] is a 49 y.o. female with a history of diverticulitis presenting with a one day history of LLQ abdominal pain and fever. Patient noticed LLQ "gas pain" yesterday afternoon that has been constant. Pain occasionally radiates to the RLQ. Pain is associated with nausea and decreased appetite. Patient felt feverish while at work today and when she took her temperature it was 99.37F orally. Patient has previously required IV antibiotics and hospitalization for diverticulitis with perforation a few years ago. Patient states that a couple days ago she noticed dark stool with bright red blood on top and on the toilet paper. Her last BM was yesterday and she felt constipated. Patient denies SOB, chest pain, vomiting, diarrhea, dysuria, hematuria, vaginal discharge, and syncope.  Past Medical History  Diagnosis Date  . BPPV (benign paroxysmal positional vertigo) 05/07/11    Described as room spinning, notes 2 episodes prior to Oct 2012.  Plans to undergo vestibular rehab.  . Hypertension 05/07/11    with grade 2 diastolic dysfunction  . PVC (premature ventricular contraction) 05/07/11    incidentally seen on telemetry  . GERD (gastroesophageal reflux disease)   . Diverticulosis   . Hyperplastic colon polyp    Past Surgical History  Procedure Laterality Date  . Vaginal hysterectomy  07/2010  . Umbilical hernia repair      Approximately 1974  . Tonsillectomy    . Cervical biopsy  w/ loop electrode excision     Family History  Problem Relation Age of Onset  . Diabetes Mother   . Breast cancer Mother   . Diabetes Maternal Grandmother   . Cervical cancer Maternal Aunt   . Deep vein thrombosis Father      died from cerebral embolus  . Colon cancer Neg Hx    Social History  Substance Use Topics  . Smoking status: Former Smoker -- 0.10 packs/day for 20 years    Types: Cigarettes    Quit date: 07/09/2005  . Smokeless tobacco: Never Used     Comment: 1 black & mild a week  . Alcohol Use: No   OB History    No data available     Review of Systems  Constitutional: Positive for fever, appetite change and fatigue. Negative for diaphoresis.  HENT: Negative for congestion, sore throat and trouble swallowing.   Eyes: Negative for visual disturbance.  Respiratory: Negative for cough, chest tightness, shortness of breath and wheezing.   Cardiovascular: Negative for chest pain, palpitations and leg swelling.  Gastrointestinal: Positive for nausea, abdominal pain, constipation and blood in stool. Negative for vomiting and diarrhea.  Genitourinary: Negative for dysuria, frequency, hematuria, flank pain, vaginal bleeding and vaginal discharge.  Musculoskeletal: Negative for myalgias, back pain and gait problem.  Skin: Negative for pallor, rash and wound.  Neurological: Negative for dizziness, syncope, numbness and headaches.    Allergies  Lisinopril  Home Medications   Prior to Admission medications   Medication Sig Start Date End Date Taking? Authorizing Provider  Multiple Vitamin (MULITIVITAMIN WITH MINERALS) TABS Take 1 tablet by mouth daily.    Historical Provider, MD  PREMARIN 1.25 MG tablet  07/26/14   Historical Provider, MD   Meds Ordered and Administered this Visit  Medications - No data  to display  BP 133/93 mmHg  Pulse 92  Temp(Src) 99.3 F (37.4 C) (Oral)  Resp 16  SpO2 96% No data found.   Physical Exam  Constitutional: She is oriented to person, place, and time. She appears well-developed and well-nourished. No distress.  HENT:  Head: Normocephalic and atraumatic.  Mouth/Throat: Oropharynx is clear and moist. No oropharyngeal exudate.  Eyes: Conjunctivae are  normal. Pupils are equal, round, and reactive to light.  Neck: Normal range of motion. Neck supple.  Cardiovascular: Normal rate, regular rhythm and normal heart sounds.  Exam reveals no gallop and no friction rub.   No murmur heard. Pulmonary/Chest: Effort normal and breath sounds normal. She has no wheezes. She has no rales.  Abdominal: Soft. Bowel sounds are decreased. There is tenderness in the right lower quadrant and left lower quadrant. There is no rigidity, no rebound, no guarding, no tenderness at McBurney's point and negative Murphy's sign.  Significant tenderness to the LLQ  Musculoskeletal: Normal range of motion. She exhibits no edema or tenderness.  Lymphadenopathy:    She has no cervical adenopathy.  Neurological: She is alert and oriented to person, place, and time.  Skin: Skin is warm and dry. No rash noted. She is not diaphoretic. No erythema. No pallor.  Psychiatric: She has a normal mood and affect. Her behavior is normal. Judgment and thought content normal.    ED Course  Procedures (including critical care time)  Labs Review Labs Reviewed - No data to display  Imaging Review No results found.   Visual Acuity Review  Right Eye Distance:   Left Eye Distance:   Bilateral Distance:    Right Eye Near:   Left Eye Near:    Bilateral Near:         MDM   1. Acute diverticulitis of intestine    49 yo female with 2d h/o llq pain c/w relapsing divert, poss perf with peritonitis.    Billy Fischer, MD 05/06/15 1350

## 2015-05-06 NOTE — ED Notes (Signed)
Pt given happy meal and drink per Dr. Darl Householder

## 2015-05-06 NOTE — ED Notes (Signed)
Pt sent here from [facility], having left side abd pain and fever. Hx of diverticulitis. Having nausea, no vomiting.

## 2015-05-06 NOTE — Discharge Instructions (Signed)
Diverticulitis °Diverticulitis is inflammation or infection of small pouches in your colon that form when you have a condition called diverticulosis. The pouches in your colon are called diverticula. Your colon, or large intestine, is where water is absorbed and stool is formed. °Complications of diverticulitis can include: °· Bleeding. °· Severe infection. °· Severe pain. °· Perforation of your colon. °· Obstruction of your colon. °CAUSES  °Diverticulitis is caused by bacteria. °Diverticulitis happens when stool becomes trapped in diverticula. This allows bacteria to grow in the diverticula, which can lead to inflammation and infection. °RISK FACTORS °People with diverticulosis are at risk for diverticulitis. Eating a diet that does not include enough fiber from fruits and vegetables may make diverticulitis more likely to develop. °SYMPTOMS  °Symptoms of diverticulitis may include: °· Abdominal pain and tenderness. The pain is normally located on the left side of the abdomen, but may occur in other areas. °· Fever and chills. °· Bloating. °· Cramping. °· Nausea. °· Vomiting. °· Constipation. °· Diarrhea. °· Blood in your stool. °DIAGNOSIS  °Your health care provider will ask you about your medical history and do a physical exam. You may need to have tests done because many medical conditions can cause the same symptoms as diverticulitis. Tests may include: °· Blood tests. °· Urine tests. °· Imaging tests of the abdomen, including X-rays and CT scans. °When your condition is under control, your health care provider may recommend that you have a colonoscopy. A colonoscopy can show how severe your diverticula are and whether something else is causing your symptoms. °TREATMENT  °Most cases of diverticulitis are mild and can be treated at home. Treatment may include: °· Taking over-the-counter pain medicines. °· Following a clear liquid diet. °· Taking antibiotic medicines by mouth for 7-10 days. °More severe cases may  be treated at a hospital. Treatment may include: °· Not eating or drinking. °· Taking prescription pain medicine. °· Receiving antibiotic medicines through an IV tube. °· Receiving fluids and nutrition through an IV tube. °· Surgery. °HOME CARE INSTRUCTIONS  °· Follow your health care provider's instructions carefully. °· Follow a full liquid diet or other diet as directed by your health care provider. After your symptoms improve, your health care provider may tell you to change your diet. He or she may recommend you eat a high-fiber diet. Fruits and vegetables are good sources of fiber. Fiber makes it easier to pass stool. °· Take fiber supplements or probiotics as directed by your health care provider. °· Only take medicines as directed by your health care provider. °· Keep all your follow-up appointments. °SEEK MEDICAL CARE IF:  °· Your pain does not improve. °· You have a hard time eating food. °· Your bowel movements do not return to normal. °SEEK IMMEDIATE MEDICAL CARE IF:  °· Your pain becomes worse. °· Your symptoms do not get better. °· Your symptoms suddenly get worse. °· You have a fever. °· You have repeated vomiting. °· You have bloody or black, tarry stools. °MAKE SURE YOU:  °· Understand these instructions. °· Will watch your condition. °· Will get help right away if you are not doing well or get worse. °  °This information is not intended to replace advice given to you by your health care provider. Make sure you discuss any questions you have with your health care provider. °  °Document Released: 04/04/2005 Document Revised: 06/30/2013 Document Reviewed: 05/20/2013 °[employer] Interactive Patient Education ©2016 [employer] ° °

## 2015-05-06 NOTE — ED Provider Notes (Signed)
CSN: [id number]     Arrival date & time 05/06/15  1350 History   First MD Initiated Contact with Patient 05/06/15 1600     Chief Complaint  Patient presents with  . Abdominal Pain  . Fever     (Consider location/radiation/quality/duration/timing/severity/associated sxs/prior Treatment) Patient is a 49 y.o. female presenting with abdominal pain. The history is provided by the patient.  Abdominal Pain Pain location:  LLQ Pain quality: aching, cramping, pressure and sharp   Pain radiates to:  LLQ Pain severity:  Severe Onset quality:  Gradual Duration:  1 day Timing:  Constant Progression:  Unchanged Chronicity:  Recurrent Context: not awakening from sleep, not diet changes, not eating, not previous surgeries, not sick contacts, not suspicious food intake and not trauma   Relieved by:  OTC medications Worsened by:  Palpation, movement and bowel movements Ineffective treatments:  None tried Associated symptoms: constipation and nausea   Associated symptoms: no anorexia, no belching, no chest pain, no chills, no cough, no diarrhea, no dysuria, no fatigue, no fever, no flatus, no hematemesis, no hematochezia, no hematuria, no melena, no shortness of breath, no sore throat, no vaginal bleeding, no vaginal discharge and no vomiting   Risk factors: has not had multiple surgeries, not obese and no recent hospitalization     Past Medical History  Diagnosis Date  . BPPV (benign paroxysmal positional vertigo) 05/07/11    Described as room spinning, notes 2 episodes prior to Oct 2012.  Plans to undergo vestibular rehab.  . Hypertension 05/07/11    with grade 2 diastolic dysfunction  . PVC (premature ventricular contraction) 05/07/11    incidentally seen on telemetry  . GERD (gastroesophageal reflux disease)   . Diverticulosis   . Hyperplastic colon polyp    Past Surgical History  Procedure Laterality Date  . Vaginal hysterectomy  07/2010  . Umbilical hernia repair      Approximately  1974  . Tonsillectomy    . Cervical biopsy  w/ loop electrode excision     Family History  Problem Relation Age of Onset  . Diabetes Mother   . Breast cancer Mother   . Diabetes Maternal Grandmother   . Cervical cancer Maternal Aunt   . Deep vein thrombosis Father     died from cerebral embolus  . Colon cancer Neg Hx    Social History  Substance Use Topics  . Smoking status: Former Smoker -- 0.10 packs/day for 20 years    Types: Cigarettes    Quit date: 07/09/2005  . Smokeless tobacco: Never Used     Comment: 1 black & mild a week  . Alcohol Use: No   OB History    No data available     Review of Systems  Constitutional: Negative for fever, chills and fatigue.  HENT: Negative for facial swelling and sore throat.   Respiratory: Negative for cough and shortness of breath.   Cardiovascular: Negative for chest pain.  Gastrointestinal: Positive for nausea, abdominal pain and constipation. Negative for vomiting, diarrhea, melena, hematochezia, anorexia, flatus and hematemesis.  Genitourinary: Negative for dysuria, hematuria, vaginal bleeding and vaginal discharge.  Musculoskeletal: Negative for back pain.  Skin: Negative for rash.  Neurological: Negative for headaches.  Psychiatric/Behavioral: Negative for confusion.      Allergies  Lisinopril  Home Medications   Prior to Admission medications   Medication Sig Start Date End Date Taking? Authorizing Provider  Multiple Vitamin (MULITIVITAMIN WITH MINERALS) TABS Take 1 tablet by mouth daily.   Yes Historical  Provider, MD  PREMARIN 0.625 MG tablet Take 0.625 mg by mouth daily. 03/09/15  Yes Historical Provider, MD  ciprofloxacin (CIPRO) 500 MG tablet Take 1 tablet (500 mg total) by mouth every 12 (twelve) hours. 05/06/15   Hoyle Sauer, MD  HYDROcodone-acetaminophen (NORCO/VICODIN) 5-325 MG tablet Take 1 tablet by mouth every 4 (four) hours as needed. 05/06/15   Hoyle Sauer, MD  metroNIDAZOLE (FLAGYL) 500 MG tablet  Take 1 tablet (500 mg total) by mouth 2 (two) times daily. 05/06/15   Hoyle Sauer, MD  ondansetron (ZOFRAN) 4 MG tablet Take 1 tablet (4 mg total) by mouth every 6 (six) hours. 05/06/15   Hoyle Sauer, MD   BP 136/84 mmHg  Pulse 79  Temp(Src) 98.7 F (37.1 C) (Oral)  Resp 16  Ht 5\' 7"  (1.702 m)  Wt 195 lb (88.451 kg)  BMI 30.53 kg/m2  SpO2 98% Physical Exam  Constitutional: She is oriented to person, place, and time. She appears well-developed and well-nourished. No distress.  HENT:  Head: Normocephalic and atraumatic.  Right Ear: External ear normal.  Left Ear: External ear normal.  Nose: Nose normal.  Mouth/Throat: Oropharynx is clear and moist. No oropharyngeal exudate.  Eyes: Conjunctivae and EOM are normal. Pupils are equal, round, and reactive to light. Right eye exhibits no discharge. Left eye exhibits no discharge. No scleral icterus.  Neck: Normal range of motion. Neck supple. No JVD present. No tracheal deviation present. No thyromegaly present.  Cardiovascular: Normal rate, regular rhythm and intact distal pulses.   Pulmonary/Chest: Effort normal and breath sounds normal. No stridor. No respiratory distress. She has no wheezes. She has no rales. She exhibits no tenderness.  Abdominal: Soft. She exhibits no distension. There is tenderness in the left lower quadrant.  Genitourinary: Rectal exam shows tenderness. Guaiac negative stool.  Musculoskeletal: Normal range of motion. She exhibits no edema or tenderness.  Lymphadenopathy:    She has no cervical adenopathy.  Neurological: She is alert and oriented to person, place, and time.  Skin: Skin is warm and dry. No rash noted. She is not diaphoretic. No erythema. No pallor.  Psychiatric: She has a normal mood and affect. Her behavior is normal. Judgment and thought content normal.  Nursing note and vitals reviewed.   ED Course  Procedures (including critical care time) Labs Review Labs Reviewed  COMPREHENSIVE  METABOLIC PANEL - Abnormal; Notable for the following:    Potassium 3.4 (*)    All other components within normal limits  CBC - Abnormal; Notable for the following:    WBC 10.6 (*)    All other components within normal limits  LIPASE, BLOOD  URINALYSIS, ROUTINE W REFLEX MICROSCOPIC (NOT AT [facility])  POC OCCULT BLOOD, ED    Imaging Review Ct Abdomen Pelvis W Contrast  05/06/2015  CLINICAL DATA:  Left lower quadrant pain. History of diverticulitis. EXAM: CT ABDOMEN AND PELVIS WITH CONTRAST TECHNIQUE: Multidetector CT imaging of the abdomen and pelvis was performed using the standard protocol following bolus administration of intravenous contrast. CONTRAST:  131mL OMNIPAQUE IOHEXOL 300 MG/ML  SOLN COMPARISON:  08/24/2011 FINDINGS: Lower chest: Few peripheral densities at the lung bases may represent atelectasis. No pleural effusions. Hepatobiliary: Normal appearance of the liver and gallbladder. The portal venous system is patent. Pancreas: No mass, inflammatory changes, or other significant abnormality. Spleen: Within normal limits in size and appearance. Adrenals/Urinary Tract: Normal appearance of the adrenal glands. Normal appearance of both kidneys without hydronephrosis. Normal appearance of the urinary bladder. Stomach/Bowel: Wall thickening  with mild inflammatory changes involving the sigmoid colon on sequence 2, image 76. Small diverticula throughout the sigmoid colon. Difficult to differentiate small inflamed diverticula versus small lymph nodes adjacent to the sigmoid wall thickening. There is a stable 8 mm low-density nodule in left lower quadrant adjacent to the colon. Normal appearance of the appendix. No evidence for obstruction and no dilatation of small bowel. Normal appearance of the stomach. Vascular/Lymphatic: Atherosclerotic calcifications in the abdominal aorta without aneurysm. No significant lymphadenopathy. Reproductive: Uterus is absent. Evidence for bilateral ovarian tissue.  Probable small follicle in the left ovary. Other: This is edema in the pelvis but no significant free fluid. No evidence for free air. Musculoskeletal:  No suspicious bone lesions identified. IMPRESSION: Focal wall thickening in the sigmoid colon with surrounding inflammatory changes. Findings are most compatible with acute diverticulitis. No evidence for an abscess collection. Electronically Signed   By: Markus Daft M.D.   On: 05/06/2015 18:44   I have personally reviewed and evaluated these images and lab results as part of my medical decision-making.   Final diagnoses:  LLQ pain  Diverticulitis of large intestine without perforation or abscess without bleeding    Patient was sent for evaluation in urgent care for left lower quadrant pain. She has a history of prior diverticulitis which was, located by perforation but did not require surgery. Patient states that she is having left lower quadrant pain which has been going on since yesterday has been getting worse. Patient states the pain is similar to her prior episode of diverticulitis. She denies fevers, chills, nausea, vomiting, diarrhea. Patient reports that she has had some constipation lately. Patient has no other acute concerns at this time. Patient given pain control IV fluids and antiemetics or emergency department. She does have signs of diverticulitis on exam.  CT demonstrates left lower quadrant diverticular changes and diverticulitis.   Pt given ABX pain medication, antiemetics, and IVF.  Feeling better with Tx.  Appropriate for d/c.  Tolerating PO.  Patient was given return precautions for diverticulitis.  Pt advised on use of medications as applicable.  Advised to return for actely worsening symptoms, inability to take medications, or other acute concerns.  Advised to follow up with PCP in 3-5 days.  Patient was in agreement with and expressed understanding of follow plan, plan of care, and return precautions.  All questions answered  prior to discharge.  Patient was discharged in stable condition, ambulating without difficulty.  Patient care was discussed with my attending, Dr. Darl Householder.   Hoyle Sauer, MD 05/08/15 0051  Wandra Arthurs, MD 05/09/15 [phone]

## 2015-05-06 NOTE — Telephone Encounter (Signed)
Cipro 500 mg twice a day, metronidazole 500 mg 3 times a day 7 days Low residue diet To the ER if not improving over the weekend or worsening abdominal pain and fever Call if not improving and completely resolved pain ROV for continuity

## 2015-05-06 NOTE — Telephone Encounter (Signed)
Spoke with pt and she states that her fever went higher and she is currently at [facility] ER.

## 2015-05-09 ENCOUNTER — Encounter: Payer: 59 | Admitting: Internal Medicine

## 2015-05-09 ENCOUNTER — Telehealth: Payer: Self-pay | Admitting: Internal Medicine

## 2015-05-09 NOTE — Telephone Encounter (Signed)
Yes, okay for OV with Dr. Havery Moros if he has space available Continue abx for diverticulitis Liquid diet Zofran 4 mg q 6-8h hr PRN nausea To ER if worsening/not improving

## 2015-05-09 NOTE — Telephone Encounter (Signed)
Pt was seen in the ER last week and diagnosed with diverticulitis. Pt was placed on cipro and flagyl. States she is having nausea and diarrhea now. States the pain in her LLQ is a constant aching throb but when she sits or stand the pain is an intense pulling pain. No app appts are available. Dr. Havery Moros is doc of the day tomorrow afternoon. Is it ok to schedule pt with Dr. Havery Moros tomorrow? Please advise.

## 2015-05-10 ENCOUNTER — Encounter: Payer: Self-pay | Admitting: Gastroenterology

## 2015-05-10 ENCOUNTER — Emergency Department (HOSPITAL_COMMUNITY)
Admission: EM | Admit: 2015-05-10 | Discharge: 2015-05-10 | Disposition: A | Discharge status: Home / Self Care | Payer: 59 | Attending: Emergency Medicine | Admitting: Emergency Medicine

## 2015-05-10 ENCOUNTER — Other Ambulatory Visit: Payer: Self-pay

## 2015-05-10 ENCOUNTER — Encounter (HOSPITAL_COMMUNITY): Payer: Self-pay | Admitting: Emergency Medicine

## 2015-05-10 ENCOUNTER — Emergency Department (HOSPITAL_COMMUNITY): Payer: 59

## 2015-05-10 ENCOUNTER — Ambulatory Visit (INDEPENDENT_AMBULATORY_CARE_PROVIDER_SITE_OTHER): Payer: 59 | Admitting: Gastroenterology

## 2015-05-10 VITALS — BP 108/80 | HR 76 | Ht 66.25 in | Wt 212.5 lb

## 2015-05-10 DIAGNOSIS — K8000 Calculus of gallbladder with acute cholecystitis without obstruction: Secondary | ICD-10-CM

## 2015-05-10 DIAGNOSIS — K8020 Calculus of gallbladder without cholecystitis without obstruction: Secondary | ICD-10-CM | POA: Insufficient documentation

## 2015-05-10 DIAGNOSIS — R1032 Left lower quadrant pain: Secondary | ICD-10-CM | POA: Diagnosis present

## 2015-05-10 DIAGNOSIS — R1031 Right lower quadrant pain: Secondary | ICD-10-CM

## 2015-05-10 DIAGNOSIS — Z86010 Personal history of colonic polyps: Secondary | ICD-10-CM | POA: Insufficient documentation

## 2015-05-10 DIAGNOSIS — Z90710 Acquired absence of both cervix and uterus: Secondary | ICD-10-CM | POA: Diagnosis not present

## 2015-05-10 DIAGNOSIS — I10 Essential (primary) hypertension: Secondary | ICD-10-CM | POA: Diagnosis not present

## 2015-05-10 DIAGNOSIS — R1011 Right upper quadrant pain: Secondary | ICD-10-CM

## 2015-05-10 DIAGNOSIS — Z8669 Personal history of other diseases of the nervous system and sense organs: Secondary | ICD-10-CM | POA: Insufficient documentation

## 2015-05-10 DIAGNOSIS — Z79899 Other long term (current) drug therapy: Secondary | ICD-10-CM | POA: Insufficient documentation

## 2015-05-10 DIAGNOSIS — Z792 Long term (current) use of antibiotics: Secondary | ICD-10-CM | POA: Diagnosis not present

## 2015-05-10 DIAGNOSIS — Z87891 Personal history of nicotine dependence: Secondary | ICD-10-CM | POA: Diagnosis not present

## 2015-05-10 DIAGNOSIS — K5732 Diverticulitis of large intestine without perforation or abscess without bleeding: Secondary | ICD-10-CM | POA: Diagnosis not present

## 2015-05-10 LAB — COMPREHENSIVE METABOLIC PANEL
ALBUMIN: 4.4 g/dL (ref 3.5–5.0)
ALK PHOS: 78 U/L (ref 38–126)
ALT: 14 U/L (ref 14–54)
ANION GAP: 8 (ref 5–15)
AST: 20 U/L (ref 15–41)
BUN: 15 mg/dL (ref 6–20)
CHLORIDE: 104 mmol/L (ref 101–111)
CO2: 27 mmol/L (ref 22–32)
Calcium: 10 mg/dL (ref 8.9–10.3)
Creatinine, Ser: 0.82 mg/dL (ref 0.44–1.00)
GFR calc non Af Amer: >60 mL/min (ref >60)
GLUCOSE: 96 mg/dL (ref 65–99)
POTASSIUM: 3.8 mmol/L (ref 3.5–5.1)
SODIUM: 139 mmol/L (ref 135–145)
Total Bilirubin: 0.4 mg/dL (ref 0.3–1.2)
Total Protein: 7.8 g/dL (ref 6.5–8.1)

## 2015-05-10 LAB — URINALYSIS, ROUTINE W REFLEX MICROSCOPIC
Bilirubin Urine: NEGATIVE
GLUCOSE, UA: NEGATIVE mg/dL
HGB URINE DIPSTICK: NEGATIVE
Ketones, ur: NEGATIVE mg/dL
LEUKOCYTES UA: NEGATIVE
Nitrite: NEGATIVE
PROTEIN: NEGATIVE mg/dL
SPECIFIC GRAVITY, URINE: 1.016 (ref 1.005–1.030)
Urobilinogen, UA: 0.2 mg/dL (ref 0.0–1.0)
pH: 5.5 (ref 5.0–8.0)

## 2015-05-10 LAB — CBC
HEMATOCRIT: 38.1 % (ref 36.0–46.0)
HEMOGLOBIN: 13 g/dL (ref 12.0–15.0)
MCH: 31 pg (ref 26.0–34.0)
MCHC: 34.1 g/dL (ref 30.0–36.0)
MCV: 90.9 fL (ref 78.0–100.0)
Platelets: 294 10*3/uL (ref 150–400)
RBC: 4.19 MIL/uL (ref 3.87–5.11)
RDW: 11.8 % (ref 11.5–15.5)
WBC: 7.8 10*3/uL (ref 4.0–10.5)

## 2015-05-10 LAB — LIPASE, BLOOD: LIPASE: 25 U/L (ref 11–51)

## 2015-05-10 MED ORDER — HYDROCODONE-ACETAMINOPHEN 5-325 MG PO TABS: 1.0000 | ORAL_TABLET | ORAL | Status: DC | PRN

## 2015-05-10 MED ORDER — ONDANSETRON HCL 4 MG PO TABS: 4.0000 mg | ORAL_TABLET | Freq: Four times a day (QID) | ORAL | Status: DC | PRN

## 2015-05-10 MED ORDER — ONDANSETRON 4 MG PO TBDP: 4.0000 mg | ORAL_TABLET | Freq: Three times a day (TID) | ORAL | Status: DC | PRN

## 2015-05-10 MED ORDER — ONDANSETRON HCL 4 MG/2ML IJ SOLN
4.0000 mg | Freq: Once | INTRAMUSCULAR | Status: AC
  Administered 2015-05-10: 4 mg via INTRAVENOUS
  Filled 2015-05-10: qty 2

## 2015-05-10 MED ORDER — SODIUM CHLORIDE 0.9 % IV BOLUS (SEPSIS)
1000.0000 mL | Freq: Once | INTRAVENOUS | Status: AC
  Administered 2015-05-10: 1000 mL via INTRAVENOUS

## 2015-05-10 NOTE — ED Notes (Signed)
Pt still unable to void. Will try again when fluids are finished.

## 2015-05-10 NOTE — Telephone Encounter (Signed)
Dr. Havery Moros I held a slot on your schedule this afternoon as you are doc of the day. Are you ok seeing this Pyrtle pt today at 3:30? See notes below.

## 2015-05-10 NOTE — Telephone Encounter (Signed)
Spoke with Dr. Havery Moros and he will see pt today at 3:30pm. Pt aware of appt. Script sent to pharmacy.

## 2015-05-10 NOTE — Discharge Instructions (Signed)
As discussed, it is very important that you follow-up with our surgery colleagues for further evaluation and management.    Return here for concerning changes in your condition.

## 2015-05-10 NOTE — ED Provider Notes (Signed)
CSN: [id number]     Arrival date & time 05/10/15  1656 History   First MD Initiated Contact with Patient 05/10/15 1910     Chief Complaint  Patient presents with  . Diverticulitis     (Consider location/radiation/quality/duration/timing/severity/associated sxs/prior Treatment) HPI Patient presents 3 days after recent evaluation for abdominal pain, resulting in a diagnosis of diverticulitis, now with concern for persistent abdominal pain, new nausea, vomiting, diarrhea. She states that her symptoms actually began about 5 days ago, with left lower quadrant abdominal pain. This pain is typical for her during episodes of diverticulitis. Since her recent evaluation she has developed severe pain in the right lateral abdomen, both upper and lower. Her nausea, vomiting, diarrhea, did not improve with any medication, including prescribed antibiotics. No new syncope, weakness, chest pain, dyspnea. Patient went to gastroenterology today, was advised to come here for repeat labs, imaging. Past Medical History  Diagnosis Date  . BPPV (benign paroxysmal positional vertigo) 05/07/11    Described as room spinning, notes 2 episodes prior to Oct 2012.  Plans to undergo vestibular rehab.  . Hypertension 05/07/11    with grade 2 diastolic dysfunction  . PVC (premature ventricular contraction) 05/07/11    incidentally seen on telemetry  . GERD (gastroesophageal reflux disease)   . Diverticulosis   . Hyperplastic colon polyp   . Diverticulitis     with microperforation   Past Surgical History  Procedure Laterality Date  . Vaginal hysterectomy  07/2010  . Umbilical hernia repair      Approximately 1974  . Tonsillectomy    . Cervical biopsy  w/ loop electrode excision     Family History  Problem Relation Age of Onset  . Diabetes Mother   . Breast cancer Mother   . Diabetes Maternal Grandmother   . Cervical cancer Maternal Aunt   . Deep vein thrombosis Father     died from cerebral embolus  .  Colon cancer Neg Hx    Social History  Substance Use Topics  . Smoking status: Former Smoker -- 0.10 packs/day for 20 years    Types: Cigarettes    Quit date: 07/09/2005  . Smokeless tobacco: Never Used     Comment: 1 black & mild a week  . Alcohol Use: No   OB History    No data available     Review of Systems  Constitutional:       Per HPI, otherwise negative  HENT:       Per HPI, otherwise negative  Respiratory:       Per HPI, otherwise negative  Cardiovascular:       Per HPI, otherwise negative  Gastrointestinal: Positive for nausea, vomiting, abdominal pain and diarrhea.  Endocrine:       Negative aside from HPI  Genitourinary:       Neg aside from HPI   Musculoskeletal:       Per HPI, otherwise negative  Skin: Negative.   Neurological: Negative for syncope.      Allergies  Lisinopril  Home Medications   Prior to Admission medications   Medication Sig Start Date End Date Taking? Authorizing Provider  ciprofloxacin (CIPRO) 500 MG tablet Take 1 tablet (500 mg total) by mouth every 12 (twelve) hours. 05/06/15   Hoyle Sauer, MD  HYDROcodone-acetaminophen (NORCO/VICODIN) 5-325 MG tablet Take 1 tablet by mouth every 4 (four) hours as needed. 05/06/15   Hoyle Sauer, MD  metroNIDAZOLE (FLAGYL) 500 MG tablet Take 1 tablet (500 mg total) by mouth  2 (two) times daily. 05/06/15   Hoyle Sauer, MD  Multiple Vitamin (MULITIVITAMIN WITH MINERALS) TABS Take 1 tablet by mouth daily.    Historical Provider, MD  ondansetron (ZOFRAN) 4 MG tablet Take 1 tablet (4 mg total) by mouth every 6 (six) hours as needed for nausea or vomiting. 05/10/15   Jerene Bears, MD  PREMARIN 0.625 MG tablet Take 0.625 mg by mouth daily. 03/09/15   Historical Provider, MD   BP 135/86 mmHg  Pulse 68  Temp(Src) 98 F (36.7 C) (Oral)  Resp 16  SpO2 100% Physical Exam  Constitutional: She is oriented to person, place, and time. She appears well-developed and well-nourished. No distress.   HENT:  Head: Normocephalic and atraumatic.  Eyes: Conjunctivae and EOM are normal.  Cardiovascular: Normal rate and regular rhythm.   Pulmonary/Chest: Effort normal and breath sounds normal. No stridor. No respiratory distress.  Abdominal: She exhibits no distension. There is tenderness in the right upper quadrant and right lower quadrant. There is positive Murphy's sign.  Musculoskeletal: She exhibits no edema.  Neurological: She is alert and oriented to person, place, and time. No cranial nerve deficit.  Skin: Skin is warm and dry.  Psychiatric: She has a normal mood and affect.  Nursing note and vitals reviewed.   ED Course  Procedures (including critical care time) Labs Review Labs Reviewed  LIPASE, BLOOD  COMPREHENSIVE METABOLIC PANEL  CBC  URINALYSIS, ROUTINE W REFLEX MICROSCOPIC (NOT AT [facility])    Imaging Review US Abdomen Limited  05/10/2015  CLINICAL DATA:  Upper abdominal pain with nausea and vomiting for 4 days EXAM: US ABDOMEN LIMITED - RIGHT UPPER QUADRANT COMPARISON:  CT abdomen and pelvis May 06, 2015 FINDINGS: Gallbladder: Within the gallbladder, there are multiple echogenic foci which move and shadow consistent with gallstones. Largest individual gallstone measures 7 mm in length. There are a few tiny echogenic foci adherent in the gallbladder wall, possibly representing mild cholesterolosis or angiomyomatosis. There is no appreciable gallbladder wall thickening or pericholecystic fluid. No sonographic Murphy sign noted. Common bile duct: Diameter: 4 mm. No intrahepatic or extrahepatic biliary duct dilatation. Liver: No focal liver lesion.  Liver echotexture within normal limits. IMPRESSION: Cholelithiasis. Suspect mild gallbladder wall inflammation, most likely cholesterolosis. Study otherwise unremarkable. Electronically Signed   By: Lowella Grip III M.D.   On: 05/10/2015 20:46   I have personally reviewed and evaluated these images and lab results as part of my  medical decision-making.   I reviewed the patient's EMR, including notes from earlier today, reviewing her imaging, labs, and indication for additional imaging, labs. MDM   Final diagnoses:  Right upper quadrant pain  Gallstones and inflammation of gallbladder without obstruction   Patient presents with concern of ongoing, though different abdominal pain. The patient has recent episode of diverticulitis, there is no evidence for perforation, nor tachypneic or sepsis tonight. Here, with new right upper quadrant pain, the patient had ultrasound performed. Demonstrated multiple gallstones, and there is no evidence for acute cholecystitis. In addition, patient is currently taking ciprofloxacin, Flagyl, if there is subacute episode. Patient discharged stable condition after her pain was controlled, to follow-up with general surgery.  Carmin Muskrat, MD 05/10/15 [phone]

## 2015-05-10 NOTE — Patient Instructions (Signed)
Please go to [facility] Emergency Department. We have called and they are expecting you.

## 2015-05-10 NOTE — ED Notes (Signed)
Pt being sent over from GI, hx of diverticulitis associated microperforation 4 years ago. Recently diagnosed with diverticulitis again following ED visit.  Pt reports abdominal pain on Thursday, fever on Friday, seen in ED and CT scan performed on 10/28, started on abx (taking cipro and flagyl). Second diverticulitis episode, first time pt required admission for 5 days.

## 2015-05-10 NOTE — Progress Notes (Signed)
HPI :  49 y/o female, patient of Dr. Hilarie Fredrickson, here for an acute visit. She has a history of a diverticulitis associated microperforation 4 years ago. Recently diagnosed with diverticulitis again following ED visit.   She reports having onset of pain on Thursday, with fever on Friday, seen in urgent care who referred to the hospital. CT scan done. Pain has been in the LLQ which has been her prior typical symptoms. However she also has some discomfort in the RLQ as well for the past 2 days. She has a sharp, pulling pain, which can be positional but quite severe and what is concerning to her. Pain is mostly in the RLQ and radiates into her back, she has not had pain like this previously from diverticulitis. She reports the prior LLQ pain is now a dull ache and not that bothersome. She has not had any further fever. She has developed some nausea / vomiting which started on Sat night, Sunday, and yesterday AM. No vomiting today but she has not eaten anything. If she eats something her pain can increase signfiicantly. She has been on mostly liquids. She has been on antibiotics since Friday. She otherwise has some diarrhea, passing loose stools. She is had 4 episodes yesterday but no episodes today. She is taking cipro and flagyl. She has not taken any vicodin but given to her from the ED.   This is the second time she has had diverticulitis. The first time she required admission for 5 days, she had a perforation which was small, and avoided a surgery. She was on prolonged bowel rest during that admission.   No fevers today. Pain rated 7.5/10 today.    Colonoscopy 4/13 - mild left sided diverticulosis, rectosigmoid hyperplastic polyp   Past Medical History  Diagnosis Date  . BPPV (benign paroxysmal positional vertigo) 05/07/11    Described as room spinning, notes 2 episodes prior to Oct 2012.  Plans to undergo vestibular rehab.  . Hypertension 05/07/11    with grade 2 diastolic dysfunction  . PVC  (premature ventricular contraction) 05/07/11    incidentally seen on telemetry  . GERD (gastroesophageal reflux disease)   . Diverticulosis   . Hyperplastic colon polyp   . Diverticulitis     with microperforation     Past Surgical History  Procedure Laterality Date  . Vaginal hysterectomy  07/2010  . Umbilical hernia repair      Approximately 1974  . Tonsillectomy    . Cervical biopsy  w/ loop electrode excision     Family History  Problem Relation Age of Onset  . Diabetes Mother   . Breast cancer Mother   . Diabetes Maternal Grandmother   . Cervical cancer Maternal Aunt   . Deep vein thrombosis Father     died from cerebral embolus  . Colon cancer Neg Hx    Social History  Substance Use Topics  . Smoking status: Former Smoker -- 0.10 packs/day for 20 years    Types: Cigarettes    Quit date: 07/09/2005  . Smokeless tobacco: Never Used     Comment: 1 black & mild a week  . Alcohol Use: No   Current Outpatient Prescriptions  Medication Sig Dispense Refill  . ciprofloxacin (CIPRO) 500 MG tablet Take 1 tablet (500 mg total) by mouth every 12 (twelve) hours. 28 tablet 0  . HYDROcodone-acetaminophen (NORCO/VICODIN) 5-325 MG tablet Take 1 tablet by mouth every 4 (four) hours as needed. 20 tablet 0  . metroNIDAZOLE (FLAGYL) 500 MG  tablet Take 1 tablet (500 mg total) by mouth 2 (two) times daily. 28 tablet 0  . Multiple Vitamin (MULITIVITAMIN WITH MINERALS) TABS Take 1 tablet by mouth daily.    . ondansetron (ZOFRAN) 4 MG tablet Take 1 tablet (4 mg total) by mouth every 6 (six) hours as needed for nausea or vomiting. 30 tablet 1  . PREMARIN 0.625 MG tablet Take 0.625 mg by mouth daily.  0   No current facility-administered medications for this visit.   Allergies  Allergen Reactions  . Lisinopril Cough     Review of Systems: All systems reviewed and negative except where noted in HPI.   Lab Results  Component Value Date   WBC 10.6* 05/06/2015   HGB 12.7 05/06/2015    HCT 37.6 05/06/2015   MCV 90.6 05/06/2015   PLT 260 05/06/2015    Lab Results  Component Value Date   ALT 14 05/06/2015   AST 18 05/06/2015   ALKPHOS 88 05/06/2015   BILITOT 0.8 05/06/2015   Lab Results  Component Value Date   CREATININE 0.73 05/06/2015   BUN 10 05/06/2015   NA 135 05/06/2015   K 3.4* 05/06/2015   CL 102 05/06/2015   CO2 24 05/06/2015      Ct Abdomen Pelvis W Contrast  05/06/2015  CLINICAL DATA:  Left lower quadrant pain. History of diverticulitis. EXAM: CT ABDOMEN AND PELVIS WITH CONTRAST TECHNIQUE: Multidetector CT imaging of the abdomen and pelvis was performed using the standard protocol following bolus administration of intravenous contrast. CONTRAST:  182mL OMNIPAQUE IOHEXOL 300 MG/ML  SOLN COMPARISON:  08/24/2011 FINDINGS: Lower chest: Few peripheral densities at the lung bases may represent atelectasis. No pleural effusions. Hepatobiliary: Normal appearance of the liver and gallbladder. The portal venous system is patent. Pancreas: No mass, inflammatory changes, or other significant abnormality. Spleen: Within normal limits in size and appearance. Adrenals/Urinary Tract: Normal appearance of the adrenal glands. Normal appearance of both kidneys without hydronephrosis. Normal appearance of the urinary bladder. Stomach/Bowel: Wall thickening with mild inflammatory changes involving the sigmoid colon on sequence 2, image 76. Small diverticula throughout the sigmoid colon. Difficult to differentiate small inflamed diverticula versus small lymph nodes adjacent to the sigmoid wall thickening. There is a stable 8 mm low-density nodule in left lower quadrant adjacent to the colon. Normal appearance of the appendix. No evidence for obstruction and no dilatation of small bowel. Normal appearance of the stomach. Vascular/Lymphatic: Atherosclerotic calcifications in the abdominal aorta without aneurysm. No significant lymphadenopathy. Reproductive: Uterus is absent. Evidence  for bilateral ovarian tissue. Probable small follicle in the left ovary. Other: This is edema in the pelvis but no significant free fluid. No evidence for free air. Musculoskeletal:  No suspicious bone lesions identified. IMPRESSION: Focal wall thickening in the sigmoid colon with surrounding inflammatory changes. Findings are most compatible with acute diverticulitis. No evidence for an abscess collection. Electronically Signed   By: Markus Daft M.D.   On: 05/06/2015 18:44    Physical Exam: BP 108/80 mmHg  Pulse 76  Ht 5' 6.25" (1.683 m)  Wt 212 lb 8 oz (96.389 kg)  BMI 34.03 kg/m2 Constitutional: Pleasant,well-developed, female in no acute distress. HEENT: Normocephalic and atraumatic. Conjunctivae are normal. No scleral icterus. Neck supple.  Cardiovascular: Normal rate, regular rhythm.  Pulmonary/chest: Effort normal and breath sounds normal. No wheezing, rales or rhonchi. Abdominal: Soft, nondistended. Lower to mid right sided abdominal pain, severe tenderness with palpation, negative rebound or guarding. Mild LLQ TTP. Bowel sounds active throughout.  There are no masses palpable. No hepatomegaly. Extremities: no edema Lymphadenopathy: No cervical adenopathy noted. Neurological: Alert and oriented to person place and time. Skin: Skin is warm and dry. No rashes noted. Psychiatric: Normal mood and affect. Behavior is normal.   ASSESSMENT AND PLAN: 49 y/o female with history of left sided diverticulosis confirmed on colonoscopy, with prior episode of sigmoid diverticulitis with microperforation 3-4 years ago. She presented a few days ago with LLQ pain and fever, found to have diverticulitis of the sigmoid without evidence of perforation on CT. Now 4 days later, and over the past 48 hours her course has changed. While her LLQ pain is improved she now has severe right sided abdominal pain with radiation to her back. She is not able to eat much without worsening of her pain. Fevers appear to have  resolved. She has normal vitals in clinic. Exam shows significant right sided pain to palpation but no peritoneal signs.   Overall, while she clearly has had recent diverticulitis, it is unclear if this new right sided pain is related (perforation?), as she has a prior history of perforation from this in the past, or if she has another separate process going on. Recommend at this time given her severe pain to go to the ER for repeat labs and repeat CT scan to ensure no perforation or worsening of diverticulitis, and rule out other causes of pain (pancreatitis, etc). I discussed this with the patient who was agreeable to have repeat imaging and labs in the ED to help determine etiology and if she warrants admission or not.   Longer term, once her acute issue is sorted out, she may consider a surgical consultation down the road for sigmoid resection given she has had now 2 episodes of diverticulitis, to prevent future occurrence. She will consider this.   Cowgill Cellar, MD [facility] Pager [phone]

## 2015-05-10 NOTE — ED Notes (Signed)
Pt was at [facility] ED on Friday for diverticulitis flare-up. Took abx, but not feeling better. Pain is now bilateral upper quadrants.

## 2015-05-10 NOTE — ED Notes (Signed)
Patient was alert, oriented and stable upon discharge. RN went over AVS and patient had no further questions.  

## 2015-05-11 ENCOUNTER — Telehealth: Payer: Self-pay | Admitting: Internal Medicine

## 2015-05-11 NOTE — Telephone Encounter (Signed)
Pt was seen by Dr. Havery Moros yesterday and sent to ER. [place] was done and pt has gallstones and needs her gallbladder removed. Pt has appt with Dr. Gershon Crane 05/24/15 for consult for gallbladder. Pt wants to know if Dr. Hilarie Fredrickson thinks it would be feasible to have a sigmoid resection at the same time. Pt having problems working and taking pain med. Wonders if would be a good idea to go out of work until surgery done? Also states she is needs to have the surgery prior to the end of the year due to job and uncertainty of insurance. I discussed with her that [facility] would be the ones to say if both surgeries could be done but [facility] told her she would have to have a referral for the resection if can be done.   Please advise.

## 2015-05-11 NOTE — Telephone Encounter (Signed)
I do not think sigmoid resection at the time of gallbladder resection would be best Dr. Doyne Keel impression was not that of biliary colic yesterday though notably symptoms had changed from 05/06/2015 when she was having left lower quadrant pain/diverticulitis by imaging Is her pain improved from when she saw Dr. Havery Moros? If not then I would recommend repeat CT which was his recommendation yesterday If pain is continuing and diverticulitis has resolved then we would need to expedite surgical opinion

## 2015-05-12 NOTE — Telephone Encounter (Signed)
Pt states that the pain on the left side is better, it is a dull ache now. The pain on the right side is the sharp pain that is really bothering her. Call placed to [facility] and pts appt moved to 05/18/15@3 :15pm, pt to arrive there at 2:45p with Dr. Johney Maine. Pt aware of appt and Dr. Hilarie Fredrickson aware.

## 2015-05-18 ENCOUNTER — Other Ambulatory Visit: Payer: Self-pay | Admitting: Surgery

## 2015-05-18 NOTE — H&P (Signed)
[name] 05/18/2015 3:18 PM Location: [facility] Patient #: [id number] DOB: 1966/04/29 Single / Language: Cleophus Molt / Race: Black or African American Female  History of Present Illness Adin Hector MD; 05/18/2015 4:18 PM) Patient words: galbladder.  The patient is a 49 year old female who presents for evaluation of gall stones. Patient sent for surgical consultation by her gastroenterologist, Dr Havery Moros. Concern for symptomatic gallstones and cholecystitis. Also has history of diverticulitis. Had second attack this year. She is wondering if she should get surgery there as well.  Pleasant obese female. History of heartburn usually controlled famotidine. Has had intermittent episodes of epigastric and especially RIGHT upper quadrant/subcostal pain. Radiating to her back and shoulder. She is felt full and nauseated. Had an ultrasound concerning for gallstones and some inflammation. Surgical consultation requested by her gastroenterologist to consider cholecystectomy. Usually attacks happen with food especially lettuce and raw vegetables. Can set it off. Not consistent with her heartburn/reflux. Not related to activity. She does not drink alcohol. No hepatitis. No pancreatitis. Usually does not take many nonsteroidals. Certainly not recently. She is not on blood thinners.   Additional reasons for visit:  Diverticulitis is described as the following: Patient also with history of diverticulitis. Had hospital admission for perforated diverticulitis in 2013. Or a microperforation that improved with IV antibiotics after few days. He that showed mild left-sided diverticulosis. Held off on considering surgical intervention. Had another attack just a few weeks ago. Nearly finished with her ciprofloxacin and metronidazole. He is only attack she knows of.  She normally has a bowel movement twice a day but is down to about every one or 2 days. No severe constipation or  ulceration. No irritable bowel. No inflammatory bowel disease. No Crohn's. No ulcerative colitis. She has dairy allergy but usually can tolerate like to eat. She had an open umbilical hernia repair when she was very young. Has had an hysterectomy as well. Good exercise tolerance. She does not smoke.   Allergies (Sonya Bynum, CMA; 05/18/2015 3:19 PM) Lisinopril *ANTIHYPERTENSIVES*  Medication History (Sonya Bynum, CMA; 05/18/2015 3:23 PM) Premarin (0.625MG  Tablet, Oral) Active. Multivitamins (Oral) Active. Medications Reconciled    Vitals (Sonya Bynum CMA; 05/18/2015 3:18 PM) 05/18/2015 3:18 PM Weight: 216.6 lb Height: 66in Body Surface Area: 2.07 m Body Mass Index: 34.96 kg/m  Temp.: 47F(Temporal)  Pulse: 76 (Regular)  BP: 130/82 (Sitting, Left Arm, Standard)      Physical Exam Adin Hector MD; 05/18/2015 4:14 PM)  General Mental Status-Alert. General Appearance-Not in acute distress, Not Sickly. Orientation-Oriented X3. Hydration-Well hydrated. Voice-Normal.  Integumentary Global Assessment Upon inspection and palpation of skin surfaces of the - Axillae: non-tender, no inflammation or ulceration, no drainage. and Distribution of scalp and body hair is normal. General Characteristics Temperature - normal warmth is noted.  Head and Neck Head-normocephalic, atraumatic with no lesions or palpable masses. Face Global Assessment - atraumatic, no absence of expression. Neck Global Assessment - no abnormal movements, no bruit auscultated on the right, no bruit auscultated on the left, no decreased range of motion, non-tender. Trachea-midline. Thyroid Gland Characteristics - non-tender.  Eye Eyeball - Left-Extraocular movements intact, No Nystagmus. Eyeball - Right-Extraocular movements intact, No Nystagmus. Cornea - Left-No Hazy. Cornea - Right-No Hazy. Sclera/Conjunctiva - Left-No scleral icterus, No  Discharge. Sclera/Conjunctiva - Right-No scleral icterus, No Discharge. Pupil - Left-Direct reaction to light normal. Pupil - Right-Direct reaction to light normal.  ENMT Ears Pinna - Left - no drainage observed, no generalized tenderness observed. Right -  no drainage observed, no generalized tenderness observed. Nose and Sinuses External Inspection of the Nose - no destructive lesion observed. Inspection of the nares - Left - quiet respiration. Right - quiet respiration. Mouth and Throat Lips - Upper Lip - no fissures observed, no pallor noted. Lower Lip - no fissures observed, no pallor noted. Nasopharynx - no discharge present. Oral Cavity/Oropharynx - Tongue - no dryness observed. Oral Mucosa - no cyanosis observed. Hypopharynx - no evidence of airway distress observed.  Chest and Lung Exam Inspection Movements - Normal and Symmetrical. Accessory muscles - No use of accessory muscles in breathing. Palpation Palpation of the chest reveals - Non-tender. Auscultation Breath sounds - Normal and Clear.  Cardiovascular Auscultation Rhythm - Regular. Murmurs & Other Heart Sounds - Auscultation of the heart reveals - No Murmurs and No Systolic Clicks.  Abdomen Inspection Inspection of the abdomen reveals - No Visible peristalsis and No Abnormal pulsations. Umbilicus - No Bleeding, No Urine drainage. Palpation/Percussion Palpation and Percussion of the abdomen reveal - Soft, Non Tender, No Rebound tenderness, No Rigidity (guarding) and No Cutaneous hyperesthesia. Note: Obese but soft. Infraumbilical transverse incision consistent with prior open umbilical hernia repair. No recurrence. No epigastric or RIGHT upper quadrant guarding. Mild discomfort RIGHT subcostal ridge anterior axillary line. An elsewhere. No left-sided abdominal pain. She notes usual area of pain was LEFT flank/LEFT lateral lower quadrant  Female Genitourinary Sexual Maturity Tanner 5 - Adult hair  pattern. Note: No vaginal bleeding nor discharge. No inguinal hernias  Peripheral Vascular Upper Extremity Inspection - Left - No Cyanotic nailbeds, Not Ischemic. Right - No Cyanotic nailbeds, Not Ischemic.  Neurologic Neurologic evaluation reveals -normal attention span and ability to concentrate, able to name objects and repeat phrases. Appropriate fund of knowledge , normal sensation and normal coordination. Mental Status Affect - not angry, not paranoid. Cranial Nerves-Normal Bilaterally. Gait-Normal.  Neuropsychiatric Mental status exam performed with findings of-able to articulate well with normal speech/language, rate, volume and coherence, thought content normal with ability to perform basic computations and apply abstract reasoning and no evidence of hallucinations, delusions, obsessions or homicidal/suicidal ideation.  Musculoskeletal Global Assessment Spine, Ribs and Pelvis - no instability, subluxation or laxity. Right Upper Extremity - no instability, subluxation or laxity.  Lymphatic Head & Neck  General Head & Neck Lymphatics: Bilateral - Description - No Localized lymphadenopathy. Axillary  General Axillary Region: Bilateral - Description - No Localized lymphadenopathy. Femoral & Inguinal  Generalized Femoral & Inguinal Lymphatics: Left - Description - No Localized lymphadenopathy. Right - Description - No Localized lymphadenopathy.    Assessment & Plan Adin Hector MD; 05/18/2015 4:15 PM)  CHRONIC CHOLECYSTITIS WITH CALCULUS (K80.10) Impression: Classic story biliary colic with referred back and shoulder pain nausea and vomiting. She'll diagnosis unlikely.  I think she would benefit from cholecystectomy. Reasonable start with single site approach. Given the fact that she is having symptoms multiple times a day, I do not think she can wait too long. She is motivated to proceed with surgery this month if possible  Current Plans You are being  scheduled for surgery - Our schedulers will call you.  You should hear from our office's scheduling department within 5 working days about the location, date, and time of surgery. We try to make accommodations for patient's preferences in scheduling surgery, but sometimes the OR schedule or the surgeon's schedule prevents [place] from making those accommodations.  If you have not heard from our office [phone]) in 5 working days, call the office  and ask for your surgeon's nurse.  If you have other questions about your diagnosis, plan, or surgery, call the office and ask for your surgeon's nurse.  Pt Education - Pamphlet Given - Laparoscopic Gallbladder Surgery: discussed with patient and provided information. Written instructions provided The anatomy & physiology of hepatobiliary & pancreatic function was discussed. The pathophysiology of gallbladder dysfunction was discussed. Natural history risks without surgery was discussed. I feel the risks of no intervention will lead to serious problems that outweigh the operative risks; therefore, I recommended cholecystectomy to remove the pathology. I explained laparoscopic techniques with possible need for an open approach. Probable cholangiogram to evaluate the bilary tract was explained as well.  Risks such as bleeding, infection, abscess, leak, injury to other organs, need for further treatment, heart attack, death, and other risks were discussed. I noted a good likelihood this will help address the problem. Possibility that this will not correct all abdominal symptoms was explained. Goals of post-operative recovery were discussed as well. We will work to minimize complications. An educational handout further explaining the pathology and treatment options was given as well. Questions were answered. The patient expresses understanding & wishes to proceed with surgery.  Pt Education - [facility] Laparosopic Post Op HCI (Camisha Srey) Pt Education - [facility] Good Bowel Health  (Ayad Nieman) Pt Education - Laparoscopic Cholecystectomy: gallbladder DIVERTICULITIS OF LARGE INTESTINE WITHOUT PERFORATION OR ABSCESS WITHOUT BLEEDING (K57.32) Impression: This is her second attack. First attack was more severe with microperforation at admission but never came to an abscess or fistula.  Young age makes me worry that she is at risk for getting more attacks. Statistically though, having only 2 attacks is not a strong indication to consider surgery at this time. Certainly would revisit this if she gets to her third are definitely her fourth attack. Would strongly recommend if she gets an attack that is complex such as a noted above with abscess, significant pneumoperitoneum, fistula formation. She is okay with holding off for now.  Current Plans Pt Education - [facility] Diverticular Disease (AT) Pt Education - [facility] Good Bowel Health (Sabel Hornbeck) Pt Education - [facility] Fiber (AT)  Adin Hector, M.D., F.A.C.S. Gastrointestinal and Minimally Invasive Surgery [facility] [address] [place], [place] [address] [phone] Main / Paging

## 2015-05-23 NOTE — Patient Instructions (Signed)
Shavette ABBEGAIL FANG  05/23/2015   Your procedure is scheduled on:   05/27/2015    Report to [facility] Main  Entrance take [place]  elevators to 3rd floor to  [place] at      [place] AM.  Call this number if you have problems the morning of surgery [phone]   Remember: ONLY 1 PERSON MAY GO WITH YOU TO SHORT STAY TO GET  READY MORNING OF [place].  Do not eat food or drink liquids :After Midnight.     Take these medicines the morning of surgery with A SIP OF WATER: Pepcid  DO NOT TAKE ANY DIABETIC MEDICATIONS DAY OF YOUR SURGERY                               You may not have any metal on your body including hair pins and              piercings  Do not wear jewelry, make-up, lotions, powders or perfumes, deodorant             Do not wear nail polish.  Do not shave  48 hours prior to surgery.                Do not bring valuables to the hospital. [place].  Contacts, dentures or bridgework may not be worn into surgery.      Patients discharged the day of surgery will not be allowed to drive home.  Name and phone number of your driver:  Special Instructions: coughing and deep breathing exercises, leg exercises               Please read over the following fact sheets you were given: _____________________________________________________________________             [facility] - Preparing for Surgery Before surgery, you can play an important role.  Because skin is not sterile, your skin needs to be as free of germs as possible.  You can reduce the number of germs on your skin by washing with CHG (chlorahexidine gluconate) soap before surgery.  CHG is an antiseptic cleaner which kills germs and bonds with the skin to continue killing germs even after washing. Please DO NOT use if you have an allergy to CHG or antibacterial soaps.  If your skin becomes reddened/irritated stop using the CHG and inform  your nurse when you arrive at Short Stay. Do not shave (including legs and underarms) for at least 48 hours prior to the first CHG shower.  You may shave your face/neck. Please follow these instructions carefully:  1.  Shower with CHG Soap the night before surgery and the  morning of Surgery.  2.  If you choose to wash your hair, wash your hair first as usual with your  normal  shampoo.  3.  After you shampoo, rinse your hair and body thoroughly to remove the  shampoo.                           4.  Use CHG as you would any other liquid soap.  You can apply chg directly  to the skin and wash  Gently with a scrungie or clean washcloth.  5.  Apply the CHG Soap to your body ONLY FROM THE NECK DOWN.   Do not use on face/ open                           Wound or open sores. Avoid contact with eyes, ears mouth and genitals (private parts).                       Wash face,  Genitals (private parts) with your normal soap.             6.  Wash thoroughly, paying special attention to the area where your surgery  will be performed.  7.  Thoroughly rinse your body with warm water from the neck down.  8.  DO NOT shower/wash with your normal soap after using and rinsing off  the CHG Soap.                9.  Pat yourself dry with a clean towel.            10.  Wear clean pajamas.            11.  Place clean sheets on your bed the night of your first shower and do not  sleep with pets. Day of Surgery : Do not apply any lotions/deodorants the morning of surgery.  Please wear clean clothes to the hospital/surgery center.  FAILURE TO FOLLOW THESE INSTRUCTIONS MAY RESULT IN THE CANCELLATION OF YOUR SURGERY PATIENT SIGNATURE_________________________________  NURSE SIGNATURE__________________________________  ________________________________________________________________________

## 2015-05-24 ENCOUNTER — Encounter (HOSPITAL_COMMUNITY)
Admission: RE | Admit: 2015-05-24 | Discharge: 2015-05-24 | Disposition: A | Discharge status: Home / Self Care | Payer: 59 | Source: Ambulatory Visit | Attending: Surgery | Admitting: Surgery

## 2015-05-24 ENCOUNTER — Encounter (HOSPITAL_COMMUNITY): Payer: Self-pay

## 2015-05-24 DIAGNOSIS — K8020 Calculus of gallbladder without cholecystitis without obstruction: Secondary | ICD-10-CM | POA: Diagnosis present

## 2015-05-24 DIAGNOSIS — Z87891 Personal history of nicotine dependence: Secondary | ICD-10-CM | POA: Diagnosis not present

## 2015-05-24 DIAGNOSIS — K5730 Diverticulosis of large intestine without perforation or abscess without bleeding: Secondary | ICD-10-CM | POA: Diagnosis not present

## 2015-05-24 DIAGNOSIS — E669 Obesity, unspecified: Secondary | ICD-10-CM | POA: Diagnosis not present

## 2015-05-24 DIAGNOSIS — Z6834 Body mass index (BMI) 34.0-34.9, adult: Secondary | ICD-10-CM | POA: Diagnosis not present

## 2015-05-24 DIAGNOSIS — I10 Essential (primary) hypertension: Secondary | ICD-10-CM | POA: Diagnosis not present

## 2015-05-24 DIAGNOSIS — K8010 Calculus of gallbladder with chronic cholecystitis without obstruction: Secondary | ICD-10-CM | POA: Diagnosis not present

## 2015-05-24 DIAGNOSIS — K219 Gastro-esophageal reflux disease without esophagitis: Secondary | ICD-10-CM | POA: Diagnosis not present

## 2015-05-24 DIAGNOSIS — Z79899 Other long term (current) drug therapy: Secondary | ICD-10-CM | POA: Diagnosis not present

## 2015-05-24 DIAGNOSIS — I493 Ventricular premature depolarization: Secondary | ICD-10-CM | POA: Diagnosis not present

## 2015-05-24 HISTORY — DX: Anemia, unspecified: D64.9

## 2015-05-24 NOTE — Progress Notes (Signed)
05/10/2015- CBC, CMP, U/A in EPIC done 05/10/2015

## 2015-05-26 NOTE — Progress Notes (Signed)
Final EKG done 05/24/15- in EPIC

## 2015-05-26 NOTE — Anesthesia Preprocedure Evaluation (Signed)
Anesthesia Evaluation  Patient identified by MRN, date of birth, ID band Patient awake    Reviewed: Allergy & Precautions, H&P , NPO status , Patient's Chart, lab work & pertinent test results  Airway Mallampati: II  TM Distance: >3 FB Neck ROM: full    Dental no notable dental hx. (+) Dental Advisory Given, Teeth Intact   Pulmonary neg pulmonary ROS, former smoker,    Pulmonary exam normal breath sounds clear to auscultation       Cardiovascular Exercise Tolerance: Good hypertension, Normal cardiovascular exam Rhythm:regular Rate:Normal  Grade 2 diastolic dysfunction. PVCs   Neuro/Psych Vertigo. Aphasia of unknown origin. negative neurological ROS  negative psych ROS   GI/Hepatic negative GI ROS, Neg liver ROS, GERD  Medicated and Controlled,  Endo/Other  negative endocrine ROS  Renal/GU negative Renal ROS  negative genitourinary   Musculoskeletal   Abdominal   Peds  Hematology negative hematology ROS (+)   Anesthesia Other Findings   Reproductive/Obstetrics negative OB ROS                             Anesthesia Physical Anesthesia Plan  ASA: III  Anesthesia Plan: General   Post-op Pain Management:    Induction: Intravenous  Airway Management Planned: Oral ETT  Additional Equipment:   Intra-op Plan:   Post-operative Plan: Extubation in OR  Informed Consent: I have reviewed the patients History and Physical, chart, labs and discussed the procedure including the risks, benefits and alternatives for the proposed anesthesia with the patient or authorized representative who has indicated his/her understanding and acceptance.   Dental Advisory Given  Plan Discussed with: CRNA and Surgeon  Anesthesia Plan Comments:         Anesthesia Quick Evaluation

## 2015-05-27 ENCOUNTER — Ambulatory Visit (HOSPITAL_COMMUNITY): Payer: 59 | Admitting: Anesthesiology

## 2015-05-27 ENCOUNTER — Ambulatory Visit (HOSPITAL_COMMUNITY): Payer: 59

## 2015-05-27 ENCOUNTER — Encounter (HOSPITAL_COMMUNITY): Payer: Self-pay | Admitting: *Deleted

## 2015-05-27 ENCOUNTER — Encounter (HOSPITAL_COMMUNITY)
Admission: RE | Disposition: A | Discharge status: Home / Self Care | Payer: Self-pay | Source: Ambulatory Visit | Attending: Surgery

## 2015-05-27 ENCOUNTER — Ambulatory Visit (HOSPITAL_COMMUNITY)
Admission: RE | Admit: 2015-05-27 | Discharge: 2015-05-27 | Disposition: A | Discharge status: Home / Self Care | Payer: 59 | Source: Ambulatory Visit | Attending: Surgery | Admitting: Surgery

## 2015-05-27 DIAGNOSIS — I493 Ventricular premature depolarization: Secondary | ICD-10-CM | POA: Insufficient documentation

## 2015-05-27 DIAGNOSIS — Z87891 Personal history of nicotine dependence: Secondary | ICD-10-CM | POA: Insufficient documentation

## 2015-05-27 DIAGNOSIS — Z419 Encounter for procedure for purposes other than remedying health state, unspecified: Secondary | ICD-10-CM

## 2015-05-27 DIAGNOSIS — E669 Obesity, unspecified: Secondary | ICD-10-CM | POA: Insufficient documentation

## 2015-05-27 DIAGNOSIS — K219 Gastro-esophageal reflux disease without esophagitis: Secondary | ICD-10-CM | POA: Insufficient documentation

## 2015-05-27 DIAGNOSIS — K8010 Calculus of gallbladder with chronic cholecystitis without obstruction: Secondary | ICD-10-CM | POA: Diagnosis not present

## 2015-05-27 DIAGNOSIS — I10 Essential (primary) hypertension: Secondary | ICD-10-CM | POA: Insufficient documentation

## 2015-05-27 DIAGNOSIS — Z79899 Other long term (current) drug therapy: Secondary | ICD-10-CM | POA: Insufficient documentation

## 2015-05-27 DIAGNOSIS — Z6834 Body mass index (BMI) 34.0-34.9, adult: Secondary | ICD-10-CM | POA: Insufficient documentation

## 2015-05-27 DIAGNOSIS — K5730 Diverticulosis of large intestine without perforation or abscess without bleeding: Secondary | ICD-10-CM | POA: Insufficient documentation

## 2015-05-27 HISTORY — PX: LAPAROSCOPIC CHOLECYSTECTOMY SINGLE SITE WITH INTRAOPERATIVE CHOLANGIOGRAM: SHX6538

## 2015-05-27 SURGERY — LAPAROSCOPIC CHOLECYSTECTOMY SINGLE SITE WITH INTRAOPERATIVE CHOLANGIOGRAM
Anesthesia: General | Site: Abdomen

## 2015-05-27 MED ORDER — IBUPROFEN 800 MG PO TABS: 200.0000 mg | ORAL_TABLET | Freq: Four times a day (QID) | ORAL | Status: DC | PRN

## 2015-05-27 MED ORDER — LIDOCAINE HCL (CARDIAC) 20 MG/ML IV SOLN
INTRAVENOUS | Status: DC | PRN
  Administered 2015-05-27: 100 mg via INTRAVENOUS

## 2015-05-27 MED ORDER — SUCCINYLCHOLINE CHLORIDE 20 MG/ML IJ SOLN
INTRAMUSCULAR | Status: DC | PRN
  Administered 2015-05-27: 100 mg via INTRAVENOUS

## 2015-05-27 MED ORDER — ONDANSETRON HCL 4 MG/2ML IJ SOLN
INTRAMUSCULAR | Status: DC | PRN
  Administered 2015-05-27: 4 mg via INTRAVENOUS

## 2015-05-27 MED ORDER — BUPIVACAINE-EPINEPHRINE 0.25% -1:200000 IJ SOLN
INTRAMUSCULAR | Status: DC | PRN
  Administered 2015-05-27: 60 mL

## 2015-05-27 MED ORDER — FENTANYL CITRATE (PF) 250 MCG/5ML IJ SOLN
INTRAMUSCULAR | Status: AC
Start: 2015-05-27 — End: 2015-05-27
  Filled 2015-05-27: qty 5

## 2015-05-27 MED ORDER — LACTATED RINGERS IV SOLN: INTRAVENOUS | Status: DC

## 2015-05-27 MED ORDER — PROMETHAZINE HCL 25 MG/ML IJ SOLN
6.2500 mg | Freq: Once | INTRAMUSCULAR | Status: AC
  Administered 2015-05-27: 6.25 mg via INTRAVENOUS

## 2015-05-27 MED ORDER — FENTANYL CITRATE (PF) 100 MCG/2ML IJ SOLN
INTRAMUSCULAR | Status: DC | PRN
  Administered 2015-05-27 (×2): 50 ug via INTRAVENOUS
  Administered 2015-05-27: 100 ug via INTRAVENOUS
  Administered 2015-05-27 (×2): 50 ug via INTRAVENOUS

## 2015-05-27 MED ORDER — LIDOCAINE HCL (CARDIAC) 20 MG/ML IV SOLN
INTRAVENOUS | Status: AC
  Filled 2015-05-27: qty 5

## 2015-05-27 MED ORDER — MIDAZOLAM HCL 2 MG/2ML IJ SOLN
INTRAMUSCULAR | Status: AC
  Filled 2015-05-27: qty 2

## 2015-05-27 MED ORDER — NEOSTIGMINE METHYLSULFATE 10 MG/10ML IV SOLN
INTRAVENOUS | Status: AC
  Filled 2015-05-27: qty 1

## 2015-05-27 MED ORDER — GLYCOPYRROLATE 0.2 MG/ML IJ SOLN
INTRAMUSCULAR | Status: AC
Start: 2015-05-27 — End: 2015-05-27
  Filled 2015-05-27: qty 4

## 2015-05-27 MED ORDER — IOHEXOL 300 MG/ML  SOLN
INTRAMUSCULAR | Status: DC | PRN
  Administered 2015-05-27: 5 mL

## 2015-05-27 MED ORDER — FENTANYL CITRATE (PF) 250 MCG/5ML IJ SOLN
INTRAMUSCULAR | Status: AC
  Filled 2015-05-27: qty 5

## 2015-05-27 MED ORDER — HYDROMORPHONE HCL 1 MG/ML IJ SOLN
0.2500 mg | INTRAMUSCULAR | Status: DC | PRN
  Administered 2015-05-27 (×2): 0.5 mg via INTRAVENOUS

## 2015-05-27 MED ORDER — KETOROLAC TROMETHAMINE 30 MG/ML IJ SOLN
INTRAMUSCULAR | Status: DC | PRN
  Administered 2015-05-27: 30 mg via INTRAVENOUS

## 2015-05-27 MED ORDER — SODIUM CHLORIDE 0.9 % IR SOLN
Status: DC | PRN
  Administered 2015-05-27: 1

## 2015-05-27 MED ORDER — KETOROLAC TROMETHAMINE 30 MG/ML IJ SOLN
INTRAMUSCULAR | Status: AC
  Filled 2015-05-27: qty 1

## 2015-05-27 MED ORDER — CHLORHEXIDINE GLUCONATE 4 % EX LIQD: 1.0000 "application " | Freq: Once | CUTANEOUS | Status: DC

## 2015-05-27 MED ORDER — HYDROMORPHONE HCL 1 MG/ML IJ SOLN
INTRAMUSCULAR | Status: AC
  Filled 2015-05-27: qty 1

## 2015-05-27 MED ORDER — BUPIVACAINE-EPINEPHRINE (PF) 0.25% -1:200000 IJ SOLN
INTRAMUSCULAR | Status: AC
  Filled 2015-05-27: qty 60

## 2015-05-27 MED ORDER — OXYCODONE HCL 5 MG PO TABS: 5.0000 mg | ORAL_TABLET | ORAL | Status: DC | PRN

## 2015-05-27 MED ORDER — 0.9 % SODIUM CHLORIDE (POUR BTL) OPTIME
TOPICAL | Status: DC | PRN
  Administered 2015-05-27: 1000 mL

## 2015-05-27 MED ORDER — MIDAZOLAM HCL 5 MG/5ML IJ SOLN
INTRAMUSCULAR | Status: DC | PRN
  Administered 2015-05-27: 2 mg via INTRAVENOUS

## 2015-05-27 MED ORDER — ROCURONIUM BROMIDE 100 MG/10ML IV SOLN
INTRAVENOUS | Status: DC | PRN
  Administered 2015-05-27: 40 mg via INTRAVENOUS

## 2015-05-27 MED ORDER — NEOSTIGMINE METHYLSULFATE 10 MG/10ML IV SOLN
INTRAVENOUS | Status: DC | PRN
  Administered 2015-05-27: 5 mg via INTRAVENOUS

## 2015-05-27 MED ORDER — GLYCOPYRROLATE 0.2 MG/ML IJ SOLN
INTRAMUSCULAR | Status: DC | PRN
  Administered 2015-05-27: .8 mg via INTRAVENOUS

## 2015-05-27 MED ORDER — PROPOFOL 10 MG/ML IV BOLUS
INTRAVENOUS | Status: AC
  Filled 2015-05-27: qty 20

## 2015-05-27 MED ORDER — PROMETHAZINE HCL 25 MG/ML IJ SOLN
INTRAMUSCULAR | Status: AC
  Filled 2015-05-27: qty 1

## 2015-05-27 MED ORDER — EPHEDRINE SULFATE 50 MG/ML IJ SOLN
INTRAMUSCULAR | Status: DC | PRN
  Administered 2015-05-27: 10 mg via INTRAVENOUS

## 2015-05-27 MED ORDER — LACTATED RINGERS IV SOLN
INTRAVENOUS | Status: DC | PRN
  Administered 2015-05-27 (×2): via INTRAVENOUS

## 2015-05-27 MED ORDER — ONDANSETRON HCL 4 MG/2ML IJ SOLN
INTRAMUSCULAR | Status: AC
  Filled 2015-05-27: qty 2

## 2015-05-27 MED ORDER — PROPOFOL 10 MG/ML IV BOLUS
INTRAVENOUS | Status: DC | PRN
  Administered 2015-05-27: 200 mg via INTRAVENOUS

## 2015-05-27 SURGICAL SUPPLY — 40 items
APPLIER CLIP 5 13 M/L LIGAMAX5 (MISCELLANEOUS) ×3
APR CLP MED LRG 5 ANG JAW (MISCELLANEOUS) ×1
BAG SPEC RTRVL LRG 6X4 10 (ENDOMECHANICALS)
CABLE HIGH FREQUENCY MONO STRZ (ELECTRODE) ×3 IMPLANT
CHLORAPREP W/TINT 26ML (MISCELLANEOUS) ×3 IMPLANT
CLIP APPLIE 5 13 M/L LIGAMAX5 (MISCELLANEOUS) ×1 IMPLANT
COVER MAYO STAND STRL (DRAPES) ×3 IMPLANT
COVER SURGICAL LIGHT HANDLE (MISCELLANEOUS) ×3 IMPLANT
DECANTER SPIKE VIAL GLASS SM (MISCELLANEOUS) ×3 IMPLANT
DRAIN CHANNEL 19F RND (DRAIN) IMPLANT
DRAPE C-ARM 42X120 X-RAY (DRAPES) ×3 IMPLANT
DRAPE LAPAROSCOPIC ABDOMINAL (DRAPES) ×3 IMPLANT
DRAPE WARM FLUID 44X44 (DRAPE) ×3 IMPLANT
DRSG TEGADERM 4X4.75 (GAUZE/BANDAGES/DRESSINGS) ×3 IMPLANT
ELECT REM PT RETURN 9FT ADLT (ELECTROSURGICAL) ×3
ELECTRODE REM PT RTRN 9FT ADLT (ELECTROSURGICAL) ×1 IMPLANT
ENDOLOOP SUT PDS II  0 18 (SUTURE)
ENDOLOOP SUT PDS II 0 18 (SUTURE) IMPLANT
EVACUATOR SILICONE 100CC (DRAIN) IMPLANT
GAUZE SPONGE 2X2 8PLY STRL LF (GAUZE/BANDAGES/DRESSINGS) ×1 IMPLANT
GLOVE ECLIPSE 8.0 STRL XLNG CF (GLOVE) ×3 IMPLANT
GLOVE INDICATOR 8.0 STRL GRN (GLOVE) ×3 IMPLANT
GOWN STRL REUS W/TWL XL LVL3 (GOWN DISPOSABLE) ×6 IMPLANT
KIT BASIN OR (CUSTOM PROCEDURE TRAY) ×3 IMPLANT
PAD POSITIONING PINK XL (MISCELLANEOUS) ×3 IMPLANT
PEN SKIN MARKING BROAD (MISCELLANEOUS) ×3 IMPLANT
POUCH SPECIMEN RETRIEVAL 10MM (ENDOMECHANICALS) IMPLANT
SCISSORS LAP 5X35 DISP (ENDOMECHANICALS) ×2 IMPLANT
SET CHOLANGIOGRAPH MIX (MISCELLANEOUS) ×3 IMPLANT
SET IRRIG TUBING LAPAROSCOPIC (IRRIGATION / IRRIGATOR) ×3 IMPLANT
SHEARS HARMONIC ACE PLUS 36CM (ENDOMECHANICALS) ×3 IMPLANT
SPONGE GAUZE 2X2 STER 10/PKG (GAUZE/BANDAGES/DRESSINGS) ×2
SUT MNCRL AB 4-0 PS2 18 (SUTURE) ×3 IMPLANT
SUT PDS AB 1 CT1 27 (SUTURE) ×6 IMPLANT
SYR 20CC LL (SYRINGE) ×3 IMPLANT
TOWEL OR 17X26 10 PK STRL BLUE (TOWEL DISPOSABLE) ×3 IMPLANT
TOWEL OR NON WOVEN STRL DISP B (DISPOSABLE) ×2 IMPLANT
TRAY LAPAROSCOPIC (CUSTOM PROCEDURE TRAY) ×3 IMPLANT
TROCAR BLADELESS OPT 5 100 (ENDOMECHANICALS) ×3 IMPLANT
TROCAR BLADELESS OPT 5 150 (ENDOMECHANICALS) ×3 IMPLANT

## 2015-05-27 NOTE — Interval H&P Note (Signed)
History and Physical Interval Note:  05/27/2015 7:20 AM  [name]  has presented today for surgery, with the diagnosis of SYMPTOMATIC BILIARY COLIC, PROBABLE CHOLECYSTITIS  The various methods of treatment have been discussed with the patient and family. After consideration of risks, benefits and other options for treatment, the patient has consented to  Procedure(s): [place] CHOLANGIOGRAM (N/A) as a surgical intervention .  The patient's history has been reviewed, patient examined, no change in status, stable for surgery.  I have reviewed the patient's chart and labs.  Questions were answered to the patient's satisfaction.     Nafis Farnan C.

## 2015-05-27 NOTE — Anesthesia Procedure Notes (Signed)
Procedure Name: Intubation Performed by: Gean Maidens Pre-anesthesia Checklist: Patient identified, Emergency Drugs available, Suction available, Patient being monitored and Timeout performed Patient Re-evaluated:Patient Re-evaluated prior to inductionOxygen Delivery Method: Circle system utilized Preoxygenation: Pre-oxygenation with 100% oxygen Intubation Type: IV induction Ventilation: Mask ventilation without difficulty Laryngoscope Size: Mac and 3 Grade View: Grade III Tube type: Oral Number of attempts: 1 Airway Equipment and Method: Stylet Placement Confirmation: ETT inserted through vocal cords under direct vision,  positive ETCO2,  CO2 detector and breath sounds checked- equal and bilateral Secured at: 22 cm Tube secured with: Tape Dental Injury: Teeth and Oropharynx as per pre-operative assessment

## 2015-05-27 NOTE — H&P (View-Only) (Signed)
[name] 05/18/2015 3:18 PM Location: [facility] Patient #: [id number] DOB: 06/04/1966 Single / Language: English / Race: Black or African American Female  History of Present Illness (Susan Galentine C. Joandy Burget [name]; 05/18/2015 4:18 PM) Patient words: galbladder.  The patient is a 49 year old female who presents for evaluation of gall stones. Patient sent for surgical consultation by her gastroenterologist, [name]. Concern for symptomatic gallstones and cholecystitis. Also has history of diverticulitis. Had second attack this year. She is wondering if she should get surgery there as well.  Pleasant obese female. History of heartburn usually controlled famotidine. Has had intermittent episodes of epigastric and especially RIGHT upper quadrant/subcostal pain. Radiating to her back and shoulder. She is felt full and nauseated. Had an ultrasound concerning for gallstones and some inflammation. Surgical consultation requested by her gastroenterologist to consider cholecystectomy. Usually attacks happen with food especially lettuce and raw vegetables. Can set it off. Not consistent with her heartburn/reflux. Not related to activity. She does not drink alcohol. No hepatitis. No pancreatitis. Usually does not take many nonsteroidals. Certainly not recently. She is not on blood thinners.   Additional reasons for visit:  Diverticulitis is described as the following: Patient also with history of diverticulitis. Had hospital admission for perforated diverticulitis in 2013. Or a microperforation that improved with IV antibiotics after few days. He that showed mild left-sided diverticulosis. Held off on considering surgical intervention. Had another attack just a few weeks ago. Nearly finished with her ciprofloxacin and metronidazole. He is only attack she knows of.  She normally has a bowel movement twice a day but is down to about every one or 2 days. No severe constipation or  ulceration. No irritable bowel. No inflammatory bowel disease. No Crohn's. No ulcerative colitis. She has dairy allergy but usually can tolerate like to eat. She had an open umbilical hernia repair when she was very young. Has had an hysterectomy as well. Good exercise tolerance. She does not smoke.   Allergies ([name], [name]; 05/18/2015 3:19 PM) Lisinopril *ANTIHYPERTENSIVES*  Medication History ([name], [name]; 05/18/2015 3:23 PM) Premarin (0.625MG Tablet, Oral) Active. Multivitamins (Oral) Active. Medications Reconciled    Vitals ([name] [name]; 05/18/2015 3:18 PM) 05/18/2015 3:18 PM Weight: 216.6 lb Height: 66in Body Surface Area: 2.07 m Body Mass Index: 34.96 kg/m  Temp.: 98F(Temporal)  Pulse: 76 (Regular)  BP: 130/82 (Sitting, Left Arm, Standard)      Physical Exam (Susan Wilshire C. Aayla Marrocco [name]; 05/18/2015 4:14 PM)  General Mental Status-Alert. General Appearance-Not in acute distress, Not Sickly. Orientation-Oriented X3. Hydration-Well hydrated. Voice-Normal.  Integumentary Global Assessment Upon inspection and palpation of skin surfaces of the - Axillae: non-tender, no inflammation or ulceration, no drainage. and Distribution of scalp and body hair is normal. General Characteristics Temperature - normal warmth is noted.  Head and Neck Head-normocephalic, atraumatic with no lesions or palpable masses. Face Global Assessment - atraumatic, no absence of expression. Neck Global Assessment - no abnormal movements, no bruit auscultated on the right, no bruit auscultated on the left, no decreased range of motion, non-tender. Trachea-midline. Thyroid Gland Characteristics - non-tender.  Eye Eyeball - Left-Extraocular movements intact, No Nystagmus. Eyeball - Right-Extraocular movements intact, No Nystagmus. Cornea - Left-No Hazy. Cornea - Right-No Hazy. Sclera/Conjunctiva - Left-No scleral icterus, No  Discharge. Sclera/Conjunctiva - Right-No scleral icterus, No Discharge. Pupil - Left-Direct reaction to light normal. Pupil - Right-Direct reaction to light normal.  ENMT Ears Pinna - Left - no drainage observed, no generalized tenderness observed. Right -   no drainage observed, no generalized tenderness observed. Nose and Sinuses External Inspection of the Nose - no destructive lesion observed. Inspection of the nares - Left - quiet respiration. Right - quiet respiration. Mouth and Throat Lips - Upper Lip - no fissures observed, no pallor noted. Lower Lip - no fissures observed, no pallor noted. Nasopharynx - no discharge present. Oral Cavity/Oropharynx - Tongue - no dryness observed. Oral Mucosa - no cyanosis observed. Hypopharynx - no evidence of airway distress observed.  Chest and Lung Exam Inspection Movements - Normal and Symmetrical. Accessory muscles - No use of accessory muscles in breathing. Palpation Palpation of the chest reveals - Non-tender. Auscultation Breath sounds - Normal and Clear.  Cardiovascular Auscultation Rhythm - Regular. Murmurs & Other Heart Sounds - Auscultation of the heart reveals - No Murmurs and No Systolic Clicks.  Abdomen Inspection Inspection of the abdomen reveals - No Visible peristalsis and No Abnormal pulsations. Umbilicus - No Bleeding, No Urine drainage. Palpation/Percussion Palpation and Percussion of the abdomen reveal - Soft, Non Tender, No Rebound tenderness, No Rigidity (guarding) and No Cutaneous hyperesthesia. Note: Obese but soft. Infraumbilical transverse incision consistent with prior open umbilical hernia repair. No recurrence. No epigastric or RIGHT upper quadrant guarding. Mild discomfort RIGHT subcostal ridge anterior axillary line. An elsewhere. No left-sided abdominal pain. She notes usual area of pain was LEFT flank/LEFT lateral lower quadrant  Female Genitourinary Sexual Maturity Tanner 5 - Adult hair  pattern. Note: No vaginal bleeding nor discharge. No inguinal hernias  Peripheral Vascular Upper Extremity Inspection - Left - No Cyanotic nailbeds, Not Ischemic. Right - No Cyanotic nailbeds, Not Ischemic.  Neurologic Neurologic evaluation reveals -normal attention span and ability to concentrate, able to name objects and repeat phrases. Appropriate fund of knowledge , normal sensation and normal coordination. Mental Status Affect - not angry, not paranoid. Cranial Nerves-Normal Bilaterally. Gait-Normal.  Neuropsychiatric Mental status exam performed with findings of-able to articulate well with normal speech/language, rate, volume and coherence, thought content normal with ability to perform basic computations and apply abstract reasoning and no evidence of hallucinations, delusions, obsessions or homicidal/suicidal ideation.  Musculoskeletal Global Assessment Spine, Ribs and Pelvis - no instability, subluxation or laxity. Right Upper Extremity - no instability, subluxation or laxity.  Lymphatic Head & Neck  General Head & Neck Lymphatics: Bilateral - Description - No Localized lymphadenopathy. Axillary  General Axillary Region: Bilateral - Description - No Localized lymphadenopathy. Femoral & Inguinal  Generalized Femoral & Inguinal Lymphatics: Left - Description - No Localized lymphadenopathy. Right - Description - No Localized lymphadenopathy.    Assessment & Plan (Shemeika Starzyk C. Caty Tessler [name]; 05/18/2015 4:15 PM)  CHRONIC CHOLECYSTITIS WITH CALCULUS (K80.10) Impression: Classic story biliary colic with referred back and shoulder pain nausea and vomiting. She'll diagnosis unlikely.  I think she would benefit from cholecystectomy. Reasonable start with single site approach. Given the fact that she is having symptoms multiple times a day, I do not think she can wait too long. She is motivated to proceed with surgery this month if possible  Current Plans You are being  scheduled for surgery - Our schedulers will call you.  You should hear from our office's scheduling department within 5 working days about the location, date, and time of surgery. We try to make accommodations for patient's preferences in scheduling surgery, but sometimes the OR schedule or the surgeon's schedule prevents us from making those accommodations.  If you have not heard from our office ([phone]) in 5 working days, call the office   and ask for your surgeon's nurse.  If you have other questions about your diagnosis, plan, or surgery, call the office and ask for your surgeon's nurse.  Pt Education - Pamphlet Given - Laparoscopic Gallbladder Surgery: discussed with patient and provided information. Written instructions provided The anatomy & physiology of hepatobiliary & pancreatic function was discussed. The pathophysiology of gallbladder dysfunction was discussed. Natural history risks without surgery was discussed. I feel the risks of no intervention will lead to serious problems that outweigh the operative risks; therefore, I recommended cholecystectomy to remove the pathology. I explained laparoscopic techniques with possible need for an open approach. Probable cholangiogram to evaluate the bilary tract was explained as well.  Risks such as bleeding, infection, abscess, leak, injury to other organs, need for further treatment, heart attack, death, and other risks were discussed. I noted a good likelihood this will help address the problem. Possibility that this will not correct all abdominal symptoms was explained. Goals of post-operative recovery were discussed as well. We will work to minimize complications. An educational handout further explaining the pathology and treatment options was given as well. Questions were answered. The patient expresses understanding & wishes to proceed with surgery.  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 First attack was more severe with microperforation at admission but never came to an abscess or fistula.  Young age makes me worry that she is at risk for getting more attacks. Statistically though, having only 2 attacks is not a strong indication to consider surgery at this time. Certainly would revisit this if she gets to her third are definitely her fourth attack. Would strongly recommend if she gets an attack that is complex such as a noted above with abscess, significant pneumoperitoneum, fistula formation. She is okay with holding off for now.  Current Plans Pt Education - [facility] Diverticular Disease (AT) Pt Education - [facility] Good Bowel Health (Arianny Pun) Pt Education - [facility] Fiber (AT)  Kaliann Coryell C. Zyara Riling, M.D., F.A.SusanS. Gastrointestinal and Minimally Invasive Surgery [facility] [address], [address] [place], [place] [address] [phone] Main / Paging    

## 2015-05-27 NOTE — Anesthesia Postprocedure Evaluation (Signed)
  Anesthesia Post-op Note  Patient: [name]  Procedure(s) Performed: Procedure(s) (LRB): LAPAROSCOPIC CHOLECYSTECTOMY SINGLE SITE WITH INTRAOPERATIVE CHOLANGIOGRAM (N/A)  Patient Location: PACU  Anesthesia Type: General  Level of Consciousness: awake and alert   Airway and Oxygen Therapy: Patient Spontanous Breathing  Post-op Pain: mild  Post-op Assessment: Post-op Vital signs reviewed, Patient's Cardiovascular Status Stable, Respiratory Function Stable, Patent Airway and No signs of Nausea or vomiting  Last Vitals:  Filed Vitals:   05/27/15 1000  BP: 124/80  Pulse: 66  Temp: 36.7 C  Resp: 10    Post-op Vital Signs: stable   Complications: No apparent anesthesia complications

## 2015-05-27 NOTE — Discharge Instructions (Signed)
LAPAROSCOPIC SURGERY: POST OP INSTRUCTIONS ° °1. DIET: Follow a light bland diet the first 24 hours after arrival home, such as soup, liquids, crackers, etc.  Be sure to include lots of fluids daily.  Avoid fast food or heavy meals as your are more likely to get nauseated.  Eat a low fat the next few days after surgery.   °2. Take your usually prescribed home medications unless otherwise directed. °3. PAIN CONTROL: °a. Pain is best controlled by a usual combination of three different methods TOGETHER: °i. Ice/Heat °ii. Over the counter pain medication °iii. Prescription pain medication °b. Most patients will experience some swelling and bruising around the incisions.  Ice packs or heating pads (30-60 minutes up to 6 times a day) will help. Use ice for the first few days to help decrease swelling and bruising, then switch to heat to help relax tight/sore spots and speed recovery.  Some people prefer to use ice alone, heat alone, alternating between ice & heat.  Experiment to what works for you.  Swelling and bruising can take several weeks to resolve.   °c. It is helpful to take an over-the-counter pain medication regularly for the first few weeks.  Choose one of the following that works best for you: °i. Naproxen (Aleve, etc)  Two 220mg tabs twice a day °ii. Ibuprofen (Advil, etc) Three 200mg tabs four times a day (every meal & bedtime) °iii. Acetaminophen (Tylenol, etc) 500-650mg four times a day (every meal & bedtime) °d. A  prescription for pain medication (such as oxycodone, hydrocodone, etc) should be given to you upon discharge.  Take your pain medication as prescribed.  °i. If you are having problems/concerns with the prescription medicine (does not control pain, nausea, vomiting, rash, itching, etc), please call us [phone] to see if we need to switch you to a different pain medicine that will work better for you and/or control your side effect better. °ii. If you need a refill on your pain medication,  please contact your pharmacy.  They will contact our office to request authorization. Prescriptions will not be filled after 5 pm or on week-ends. °4. Avoid getting constipated.  Between the surgery and the pain medications, it is common to experience some constipation.  Increasing fluid intake and taking a fiber supplement (such as Metamucil, Citrucel, FiberCon, MiraLax, etc) 1-2 times a day regularly will usually help prevent this problem from occurring.  A mild laxative (prune juice, Milk of Magnesia, MiraLax, etc) should be taken according to package directions if there are no bowel movements after 48 hours.   °5. Watch out for diarrhea.  If you have many loose bowel movements, simplify your diet to bland foods & liquids for a few days.  Stop any stool softeners and decrease your fiber supplement.  Switching to mild anti-diarrheal medications (Kayopectate, Pepto Bismol) can help.  If this worsens or does not improve, please call us. °6. Wash / shower every day.  You may shower over the dressings as they are waterproof.  Continue to shower over incision(s) after the dressing is off. °7. Remove your waterproof bandages 5 days after surgery.  You may leave the incision open to air.  You may replace a dressing/Band-Aid to cover the incision for comfort if you wish.  °8. ACTIVITIES as tolerated:   °a. You may resume regular (light) daily activities beginning the next day--such as daily self-care, walking, climbing stairs--gradually increasing activities as tolerated.  If you can walk 30 minutes without difficulty, it   is safe to try more intense activity such as jogging, treadmill, bicycling, low-impact aerobics, swimming, etc. °b. Save the most intensive and strenuous activity for last such as sit-ups, heavy lifting, contact sports, etc  Refrain from any heavy lifting or straining until you are off narcotics for pain control.   °c. DO NOT PUSH THROUGH PAIN.  Let pain be your guide: If it hurts to do something, don't  do it.  Pain is your body warning you to avoid that activity for another week until the pain goes down. °d. You may drive when you are no longer taking prescription pain medication, you can comfortably wear a seatbelt, and you can safely maneuver your car and apply brakes. °e. You may have sexual intercourse when it is comfortable.  °9. FOLLOW UP in our office °a. Please call [facility] at [phone] to set up an appointment to see your surgeon in the office for a follow-up appointment approximately 2-3 weeks after your surgery. °b. Make sure that you call for this appointment the day you arrive home to insure a convenient appointment time. °10. IF YOU HAVE DISABILITY OR FAMILY LEAVE FORMS, BRING THEM TO THE OFFICE FOR PROCESSING.  DO NOT GIVE THEM TO YOUR DOCTOR. ° ° °WHEN TO CALL US [phone]: °1. Poor pain control °2. Reactions / problems with new medications (rash/itching, nausea, etc)  °3. Fever over 101.5 F (38.5 C) °4. Inability to urinate °5. Nausea and/or vomiting °6. Worsening swelling or bruising °7. Continued bleeding from incision. °8. Increased pain, redness, or drainage from the incision ° ° The clinic staff is available to answer your questions during regular business hours (8:30am-5pm).  Please don’t hesitate to call and ask to speak to one of our nurses for clinical concerns.  ° If you have a medical emergency, go to the nearest emergency room or call 911. ° A surgeon from [facility] is always on call at the hospitals ° ° °[facility] °[address], [address], [place], [place]  [address] ? °MAIN: [phone] ? TOLL FREE: [phone] ?  °FAX [phone] °[email or web address] ° °GETTING TO GOOD BOWEL HEALTH. °Irregular bowel habits such as constipation and diarrhea can lead to many problems over time.  Having one soft bowel movement a day is the most important way to prevent further problems.  The anorectal canal is designed to handle  stretching and feces to safely manage our ability to get rid of solid waste (feces, poop, stool) out of our body.  BUT, hard constipated stools can act like ripping concrete bricks and diarrhea can be a burning fire to this very sensitive area of our body, causing inflamed hemorrhoids, anal fissures, increasing risk is perirectal abscesses, abdominal pain/bloating, an making irritable bowel worse.     ° °The goal: ONE SOFT BOWEL MOVEMENT A DAY!  To have soft, regular bowel movements:  °• Drink plenty of fluids, consider 4-6 tall glasses of water a day.   °• Take plenty of fiber.  Fiber is the undigested part of plant food that passes into the colon, acting s “natures broom” to encourage bowel motility and movement.  Fiber can absorb and hold large amounts of water. This results in a larger, bulkier stool, which is soft and easier to pass. Work gradually over several weeks up to 6 servings a day of fiber (25g a day even more if needed) in the form of: °o Vegetables -- Root (potatoes, carrots, turnips), leafy green (lettuce, salad greens,   celery, spinach), or cooked high residue (cabbage, broccoli, etc) °o Fruit -- Fresh (unpeeled skin & pulp), Dried (prunes, apricots, cherries, etc ),  or stewed ( applesauce)  °o Whole grain breads, pasta, etc (whole wheat)  °o Bran cereals  °• Bulking Agents -- This type of water-retaining fiber generally is easily obtained each day by one of the following:  °o Psyllium bran -- The psyllium plant is remarkable because its ground seeds can retain so much water. This product is available as Metamucil, Konsyl, Effersyllium, Per Diem Fiber, or the less expensive generic preparation in drug and health food stores. Although labeled a laxative, it really is not a laxative.  °o Methylcellulose -- This is another fiber derived from wood which also retains water. It is available as Citrucel. °o Polyethylene Glycol - and “artificial” fiber commonly called Miralax or Glycolax.  It is helpful  for people with gassy or bloated feelings with regular fiber °o Flax Seed - a less gassy fiber than psyllium °• No reading or other relaxing activity while on the toilet. If bowel movements take longer than 5 minutes, you are too constipated °• AVOID CONSTIPATION.  High fiber and water intake usually takes care of this.  Sometimes a laxative is needed to stimulate more frequent bowel movements, but  °• Laxatives are not a good long-term solution as it can wear the colon out.  They can help jump-start bowels if constipated, but should be relied on constantly without discussing with your doctor °o Osmotics (Milk of Magnesia, Fleets phosphosoda, Magnesium citrate, MiraLax, GoLytely) are safer than  °o Stimulants (Senokot, Castor Oil, Dulcolax, Ex Lax)    °o Avoid taking laxatives for more than 7 days in a row. °•  IF SEVERELY CONSTIPATED, try a Bowel Retraining Program: °o Do not use laxatives.  °o Eat a diet high in roughage, such as bran cereals and leafy vegetables.  °o Drink six (6) ounces of prune or apricot juice each morning.  °o Eat two (2) large servings of stewed fruit each day.  °o Take one (1) heaping tablespoon of a psyllium-based bulking agent twice a day. Use sugar-free sweetener when possible to avoid excessive calories.  °o Eat a normal breakfast.  °o Set aside 15 minutes after breakfast to sit on the toilet, but do not strain to have a bowel movement.  °o If you do not have a bowel movement by the third day, use an enema and repeat the above steps.  °• Controlling diarrhea °o Switch to liquids and simpler foods for a few days to avoid stressing your intestines further. °o Avoid dairy products (especially milk & ice cream) for a short time.  The intestines often can lose the ability to digest lactose when stressed. °o Avoid foods that cause gassiness or bloating.  Typical foods include beans and other legumes, cabbage, broccoli, and dairy foods.  Every person has some sensitivity to other foods, so  listen to our body and avoid those foods that trigger problems for you. °o Adding fiber (Citrucel, Metamucil, psyllium, Miralax) gradually can help thicken stools by absorbing excess fluid and retrain the intestines to act more normally.  Slowly increase the dose over a few weeks.  Too much fiber too soon can backfire and cause cramping & bloating. °o Probiotics (such as active yogurt, Align, etc) may help repopulate the intestines and colon with normal bacteria and calm down a sensitive digestive tract.  Most studies show it to be of mild help, though, and such products   can be costly. °o Medicines: °- Bismuth subsalicylate (ex. Kayopectate, Pepto Bismol) every 30 minutes for up to 6 doses can help control diarrhea.  Avoid if pregnant. °- Loperamide (Immodium) can slow down diarrhea.  Start with two tablets (4mg total) first and then try one tablet every 6 hours.  Avoid if you are having fevers or severe pain.  If you are not better or start feeling worse, stop all medicines and call your doctor for advice °o Call your doctor if you are getting worse or not better.  Sometimes further testing (cultures, endoscopy, X-ray studies, bloodwork, etc) may be needed to help diagnose and treat the cause of the diarrhea. ° °TROUBLESHOOTING IRREGULAR BOWELS °1) Avoid extremes of bowel movements (no bad constipation/diarrhea) °2) Miralax 17gm mixed in 8oz. water or juice-daily. May use BID as needed.  °3) Gas-x,Phazyme, etc. as needed for gas & bloating.  °4) Soft,bland diet. No spicy,greasy,fried foods.  °5) Prilosec over-the-counter as needed  °6) May hold gluten/wheat products from diet to see if symptoms improve.  °7)  May try probiotics (Align, Activa, etc) to help calm the bowels down °7) If symptoms become worse call back immediately. ° °Managing Pain ° °Pain after surgery or related to activity is often due to strain/injury to muscle, tendon, nerves and/or incisions.  This pain is usually short-term and will improve in a  few months.  ° °Many people find it helpful to do the following things TOGETHER to help speed the process of healing and to get back to regular activity more quickly: ° °1. Avoid heavy physical activity at first °a. No lifting greater than 20 pounds at first, then increase to lifting as tolerated over the next few weeks °b. Do not “push through” the pain.  Listen to your body and avoid positions and maneuvers than reproduce the pain.  Wait a few days before trying something more intense °c. Walking is okay as tolerated, but go slowly and stop when getting sore.  If you can walk 30 minutes without stopping or pain, you can try more intense activity (running, jogging, aerobics, cycling, swimming, treadmill, sex, sports, weightlifting, etc ) °d. Remember: If it hurts to do it, then don’t do it! ° °2. Take Anti-inflammatory medication °a. Choose ONE of the following over-the-counter medications: °i.            Acetaminophen 500mg tabs (Tylenol) 1-2 pills with every meal and just before bedtime (avoid if you have liver problems) °ii.            Naproxen 220mg tabs (ex. Aleve) 1-2 pills twice a day (avoid if you have kidney, stomach, IBD, or bleeding problems) °iii. Ibuprofen 200mg tabs (ex. Advil, Motrin) 3-4 pills with every meal and just before bedtime (avoid if you have kidney, stomach, IBD, or bleeding problems) °b. Take with food/snack around the clock for 1-2 weeks °i. This helps the muscle and nerve tissues become less irritable and calm down faster ° °3. Use a Heating pad or Ice/Cold Pack °a. 4-6 times a day °b. May use warm bath/hottub  or showers ° °4. Try Gentle Massage and/or Stretching  °a. at the area of pain many times a day °b. stop if you feel pain - do not overdo it ° °Try these steps together to help you body heal faster and avoid making things get worse.  Doing just one of these things may not be enough.   ° °If you are not getting better after two weeks or are noticing   you are getting worse, contact  our office for further advice; we may need to re-evaluate you & see what other things we can do to help. ° °

## 2015-05-27 NOTE — Op Note (Signed)
05/27/2015  8:53 AM  PATIENT:  [name]  49 y.o. female  Patient Care Team: Lucious Groves, DO as PCP - General (Internal Medicine) Michael Boston, MD as Consulting Physician (General Surgery) Manus Gunning, MD as Consulting Physician (Gastroenterology)  PRE-OPERATIVE DIAGNOSIS:  SYMPTOMATIC BILIARY COLIC, PROBABLE CHOLECYSTITIS  POST-OPERATIVE DIAGNOSIS:  CHRONIC CHOLECYSTITIS  PROCEDURE:  Procedure(s): LAPAROSCOPIC CHOLECYSTECTOMY SINGLE SITE WITH INTRAOPERATIVE CHOLANGIOGRAM  SURGEON:  Surgeon(s): Michael Boston, MD  ASSISTANT: none   ANESTHESIA:   local and general  EBL:  Total I/O In: 1500 [I.V.:1500] Out: -   Delay start of Pharmacological VTE agent (>24hrs) due to surgical blood loss or risk of bleeding:  no  DRAINS:  none   SPECIMEN:  Source of Specimen:   Gallbladder   DISPOSITION OF SPECIMEN:  PATHOLOGY  COUNTS:  YES  PLAN OF CARE: Discharge to home after PACU  PATIENT DISPOSITION:  PACU - hemodynamically stable.  INDICATION: Patient with biliary colic & gallstones  The anatomy & physiology of hepatobiliary & pancreatic function was discussed.  The pathophysiology of gallbladder dysfunction was discussed.  Natural history risks without surgery was discussed.   I feel the risks of no intervention will lead to serious problems that outweigh the operative risks; therefore, I recommended cholecystectomy to remove the pathology.  I explained laparoscopic techniques with possible need for an open approach.  Probable cholangiogram to evaluate the bilary tract was explained as well.    Risks such as bleeding, infection, abscess, leak, injury to other organs, need for further treatment, heart attack, death, and other risks were discussed.  I noted a good likelihood this will help address the problem.  Possibility that this will not correct all abdominal symptoms was explained.  Goals of post-operative recovery were discussed as well.  We will work to  minimize complications.  An educational handout further explaining the pathology and treatment options was given as well.  Questions were answered.  The patient expresses understanding & wishes to proceed with surgery.   OR FINDINGS: Thicke dilated boggy GB  IOC WNL.  Long cystic duct  Liver WNL  DESCRIPTION:   The patient was identified & brought in the operating room. The patient was positioned supine with arms tucked. SCDs were active during the entire case. The patient underwent general anesthesia without any difficulty.  The abdomen was prepped and draped in a sterile fashion. A Surgical Timeout confirmed our plan.  I made a transverse curvilinear incision through the superior umbilical fold.  I placed a 71mm long port through the supraumbilical fascia using a modified Hassan cutdown technique. I began carbon dioxide insufflation. Camera inspection revealed no injury. There were no adhesions to the anterior abdominal wall supraumbilically.  I proceeded to continue with single site technique. I placed a #5 port in left upper aspect of the wound. I placed a 5 mm atraumatic grasper in the right inferior aspect of the wound.  I turned attention to the right upper quadrant.  The gallbladder fundus was elevated cephalad. I freed the peritoneal coverings between the gallbladder and the liver on the posteriolateral and anteriomedial walls. I alternated between Harmonic & blunt Maryland dissection to help get a good critical view of the cystic artery and cystic duct. I did further dissection to free a few centimeters of the  gallbladder off the liver bed to get a good critical view of the infundibulum and cystic duct. I mobilized the cystic artery; and, after getting a good 360 view, ligated the cystic  artery using the Harmonic ultrasonic dissection. I skeletonized the cystic duct.  I placed a clip on the infundibulum. I did a partial cystic duct-otomy and ensured patency. I placed a 5 [place]  cholangiocatheter through a puncture site at the right subcostal ridge of the abdominal wall and directed it into the cystic duct.  We ran a cholangiogram with dilute radio-opaque contrast and continuous fluoroscopy. Contrast flowed from a side branch consistent with cystic duct cannulization. Contrast flowed up the common hepatic duct into the right and left intrahepatic chains out to secondary radicals. Contrast flowed down the common bile duct easily across the normal ampulla into the duodenum.  This was consistent with a normal cholangiogram.  I removed the cholangiocatheter. I placed clips on the cystic duct x4.  I completed cystic duct transection. I freed the gallbladder from its remaining attachments to the liver. I ensured hemostasis on the gallbladder fossa of the liver and elsewhere. I inspected the rest of the abdomen & detected no injury nor bleeding elsewhere.  I removed the gallbladder out the supraumbilical fascia. I closed the fascia transversely using 0 Vicryl interrupted stitches. A closed the skin using 4-0 monocryl stitch.  Sterile dressing was applied. The patient was extubated & arrived in the PACU in stable condition..  I had discussed postoperative care with the patient in the holding area.  I am about to locate the patient's family and discuss operative findings and postoperative goals / instructions.  Instructions are written in the chart as well.  Adin Hector, M.D., F.A.C.S. Gastrointestinal and Minimally Invasive Surgery [facility] [address], [place], [place] [address] [phone] Main / Paging

## 2015-05-27 NOTE — Transfer of Care (Signed)
Immediate Anesthesia Transfer of Care Note  Patient: [name]  Procedure(s) Performed: Procedure(s): LAPAROSCOPIC CHOLECYSTECTOMY SINGLE SITE WITH INTRAOPERATIVE CHOLANGIOGRAM (N/A)  Patient Location: PACU  Anesthesia Type:General  Level of Consciousness: awake, alert  and oriented  Airway & Oxygen Therapy: Patient Spontanous Breathing and Patient connected to face mask oxygen  Post-op Assessment: Report given to RN and Post -op Vital signs reviewed and stable  Post vital signs: Reviewed and stable  Last Vitals:  Filed Vitals:   05/27/15 0605  BP: 124/78  Pulse: 66  Temp: 36.6 C  Resp: 18    Complications: No apparent anesthesia complications

## 2015-05-27 NOTE — Progress Notes (Signed)
Returned to short stay from PACU. Patient is very drowsy but alert. Still complains with slight nausea. Sips ginger ale

## 2015-07-10 HISTORY — PX: COLON SURGERY: SHX602

## 2015-08-24 ENCOUNTER — Telehealth: Payer: Self-pay | Admitting: Internal Medicine

## 2015-08-24 NOTE — Telephone Encounter (Signed)
APPT REMINDER CALL, LMTCB IF SHE NEEDS TO CANCEL °

## 2015-08-25 ENCOUNTER — Encounter: Payer: 59 | Admitting: Internal Medicine

## 2016-03-26 ENCOUNTER — Other Ambulatory Visit: Payer: Self-pay | Admitting: Obstetrics and Gynecology

## 2016-03-26 DIAGNOSIS — Z1231 Encounter for screening mammogram for malignant neoplasm of breast: Secondary | ICD-10-CM

## 2016-03-28 ENCOUNTER — Ambulatory Visit
Admission: RE | Admit: 2016-03-28 | Discharge: 2016-03-28 | Disposition: A | Discharge status: Home / Self Care | Payer: 59 | Source: Ambulatory Visit | Attending: Obstetrics and Gynecology | Admitting: Obstetrics and Gynecology

## 2016-03-28 DIAGNOSIS — Z1231 Encounter for screening mammogram for malignant neoplasm of breast: Secondary | ICD-10-CM

## 2016-09-03 ENCOUNTER — Encounter (HOSPITAL_COMMUNITY): Payer: Self-pay

## 2016-09-03 ENCOUNTER — Emergency Department (HOSPITAL_COMMUNITY): Payer: 59

## 2016-09-03 ENCOUNTER — Emergency Department (HOSPITAL_COMMUNITY)
Admission: EM | Admit: 2016-09-03 | Discharge: 2016-09-03 | Disposition: A | Discharge status: Home / Self Care | Payer: 59 | Attending: Emergency Medicine | Admitting: Emergency Medicine

## 2016-09-03 DIAGNOSIS — R1032 Left lower quadrant pain: Secondary | ICD-10-CM

## 2016-09-03 DIAGNOSIS — I10 Essential (primary) hypertension: Secondary | ICD-10-CM | POA: Insufficient documentation

## 2016-09-03 DIAGNOSIS — Z91040 Latex allergy status: Secondary | ICD-10-CM | POA: Diagnosis not present

## 2016-09-03 DIAGNOSIS — Z79899 Other long term (current) drug therapy: Secondary | ICD-10-CM | POA: Insufficient documentation

## 2016-09-03 DIAGNOSIS — Z87891 Personal history of nicotine dependence: Secondary | ICD-10-CM | POA: Insufficient documentation

## 2016-09-03 DIAGNOSIS — K5730 Diverticulosis of large intestine without perforation or abscess without bleeding: Secondary | ICD-10-CM | POA: Diagnosis not present

## 2016-09-03 LAB — CBC
HEMATOCRIT: 37.2 % (ref 36.0–46.0)
Hemoglobin: 12.6 g/dL (ref 12.0–15.0)
MCH: 31.6 pg (ref 26.0–34.0)
MCHC: 33.9 g/dL (ref 30.0–36.0)
MCV: 93.2 fL (ref 78.0–100.0)
Platelets: 280 10*3/uL (ref 150–400)
RBC: 3.99 MIL/uL (ref 3.87–5.11)
RDW: 12.1 % (ref 11.5–15.5)
WBC: 9.8 10*3/uL (ref 4.0–10.5)

## 2016-09-03 LAB — COMPREHENSIVE METABOLIC PANEL
ALBUMIN: 4 g/dL (ref 3.5–5.0)
ALT: 15 U/L (ref 14–54)
ANION GAP: 7 (ref 5–15)
AST: 19 U/L (ref 15–41)
Alkaline Phosphatase: 73 U/L (ref 38–126)
BILIRUBIN TOTAL: 0.5 mg/dL (ref 0.3–1.2)
BUN: 16 mg/dL (ref 6–20)
CO2: 24 mmol/L (ref 22–32)
Calcium: 10 mg/dL (ref 8.9–10.3)
Chloride: 108 mmol/L (ref 101–111)
Creatinine, Ser: 0.82 mg/dL (ref 0.44–1.00)
Glucose, Bld: 115 mg/dL — ABNORMAL HIGH (ref 65–99)
POTASSIUM: 3.6 mmol/L (ref 3.5–5.1)
Sodium: 139 mmol/L (ref 135–145)
TOTAL PROTEIN: 7.2 g/dL (ref 6.5–8.1)

## 2016-09-03 LAB — URINALYSIS, ROUTINE W REFLEX MICROSCOPIC
BILIRUBIN URINE: NEGATIVE
Glucose, UA: NEGATIVE mg/dL
Hgb urine dipstick: NEGATIVE
KETONES UR: NEGATIVE mg/dL
Leukocytes, UA: NEGATIVE
NITRITE: NEGATIVE
PH: 5 (ref 5.0–8.0)
Protein, ur: NEGATIVE mg/dL
Specific Gravity, Urine: 1.021 (ref 1.005–1.030)

## 2016-09-03 LAB — LIPASE, BLOOD: Lipase: 32 U/L (ref 11–51)

## 2016-09-03 MED ORDER — FENTANYL CITRATE (PF) 100 MCG/2ML IJ SOLN
50.0000 ug | Freq: Once | INTRAMUSCULAR | Status: AC
Start: 2016-09-03 — End: 2016-09-03
  Administered 2016-09-03: 50 ug via INTRAVENOUS
  Filled 2016-09-03: qty 2

## 2016-09-03 MED ORDER — ONDANSETRON 4 MG PO TBDP: ORAL_TABLET | ORAL | 0 refills | Status: DC

## 2016-09-03 MED ORDER — IOPAMIDOL (ISOVUE-300) INJECTION 61%
INTRAVENOUS | Status: AC
  Administered 2016-09-03: 100 mL
  Filled 2016-09-03: qty 100

## 2016-09-03 MED ORDER — METRONIDAZOLE 500 MG PO TABS: 500.0000 mg | ORAL_TABLET | Freq: Three times a day (TID) | ORAL | 0 refills | Status: DC

## 2016-09-03 MED ORDER — ONDANSETRON HCL 4 MG/2ML IJ SOLN
4.0000 mg | Freq: Once | INTRAMUSCULAR | Status: AC
  Administered 2016-09-03: 4 mg via INTRAVENOUS
  Filled 2016-09-03: qty 2

## 2016-09-03 MED ORDER — CIPROFLOXACIN HCL 500 MG PO TABS: 500.0000 mg | ORAL_TABLET | Freq: Two times a day (BID) | ORAL | 0 refills | Status: DC

## 2016-09-03 NOTE — ED Notes (Signed)
Pt transported to CT ?

## 2016-09-03 NOTE — ED Triage Notes (Signed)
Pt complaining of L lower abdominal pain x 5 days. Pt states hx of diverticulitis. Pt denies any fevers or chills. Pt complaining of diarrhea and constipation. Pt denies any emesis.

## 2016-09-03 NOTE — ED Provider Notes (Signed)
[facility] DEPT Provider Note   CSN: [id number] Arrival date & time: 09/03/16  [id number]     History   Chief Complaint Chief Complaint  Patient presents with  . Abdominal Pain    HPI [name] is a 51 y.o. female with a hx of diverticulitis (with perforation), anemia, GERD, HTN, PVC, hysterectomy and cholecystectomy presents to the Emergency Department complaining of gradual, persistent, progressively worsening LLQ abd pain onset 4 days ago.  Pt rates her pain at a 10/10 and describes the pain as constant cramp, burning in the LLQ. Associated symptoms include nausea without vomiting.  Nothing makes it better and movement makes it worse.  Last BM was yesterday and normal.  Pt denies fever, chills, diarrhea, melena, hematochezia.      The history is provided by the patient and medical records. No language interpreter was used.    Past Medical History:  Diagnosis Date  . Anemia    hx of prior to hysterectomy  . BPPV (benign paroxysmal positional vertigo) 05/07/11   Described as room spinning, notes 2 episodes prior to Oct 2012.  Plans to undergo vestibular rehab.  . Diverticulitis    with microperforation  . Diverticulosis   . GERD (gastroesophageal reflux disease)   . Hyperplastic colon polyp   . Hypertension 05/07/11   with grade 2 diastolic dysfunction not on meds x 3 years   . PVC (premature ventricular contraction) 05/07/11   incidentally seen on telemetry    Patient Active Problem List   Diagnosis Date Noted  . Preventative health care 08/04/2014  . Cyst of joint of shoulder 08/04/2014  . Palpitations 09/18/2013  . Menopausal symptoms 09/18/2013  . Obesity (BMI 30-39.9) 03/12/2013  . Ear ache 03/12/2013  . Yeast infection 09/07/2011  . Diverticulosis of sigmoid colon 08/24/2011  . Aphasia of unknown origin 05/24/2011  . GERD (gastroesophageal reflux disease) 05/24/2011  . BPPV (benign paroxysmal positional vertigo) 05/07/2011  . Hypertension 05/07/2011    . PVC (premature ventricular contraction) 05/07/2011    Past Surgical History:  Procedure Laterality Date  . CERVICAL BIOPSY  W/ LOOP ELECTRODE EXCISION    . LAPAROSCOPIC CHOLECYSTECTOMY SINGLE SITE WITH INTRAOPERATIVE CHOLANGIOGRAM N/A 05/27/2015   Procedure: LAPAROSCOPIC CHOLECYSTECTOMY SINGLE SITE WITH INTRAOPERATIVE CHOLANGIOGRAM;  Surgeon: Michael Boston, MD;  Location: [facility];  Service: General;  Laterality: N/A;  . TONSILLECTOMY    . UMBILICAL HERNIA REPAIR     Approximately 1974  . VAGINAL HYSTERECTOMY  07/2010    OB History    No data available       Home Medications    Prior to Admission medications   Medication Sig Start Date End Date Taking? Authorizing Provider  famotidine (PEPCID) 20 MG tablet Take 20 mg by mouth daily as needed for heartburn or indigestion.   Yes Historical Provider, MD  Multiple Vitamin (MULITIVITAMIN WITH MINERALS) TABS Take 1 tablet by mouth daily.   Yes Historical Provider, MD  ciprofloxacin (CIPRO) 500 MG tablet Take 1 tablet (500 mg total) by mouth every 12 (twelve) hours. 09/03/16   Luvina Poirier, PA-C  metroNIDAZOLE (FLAGYL) 500 MG tablet Take 1 tablet (500 mg total) by mouth 3 (three) times daily. 09/03/16   Maisa Bedingfield, PA-C  ondansetron (ZOFRAN ODT) 4 MG disintegrating tablet 4mg  ODT q4 hours prn nausea/vomit 09/03/16   Jarrett Soho Francy Mcilvaine, PA-C    Family History Family History  Problem Relation Age of Onset  . Diabetes Mother   . Breast cancer Mother   . Diabetes Maternal Grandmother   .  Cervical cancer Maternal Aunt   . Deep vein thrombosis Father     died from cerebral embolus  . Colon cancer Neg Hx     Social History Social History  Substance Use Topics  . Smoking status: Former Smoker    Packs/day: 0.10    Years: 20.00    Types: Cigarettes    Quit date: 07/09/2005  . Smokeless tobacco: Never Used     Comment: 1 black & mild a week  . Alcohol use Yes     Comment: once every few months      Allergies    Lisinopril; Morphine and related; and Latex   Review of Systems Review of Systems  Gastrointestinal: Positive for abdominal pain and nausea. Negative for vomiting.  All other systems reviewed and are negative.    Physical Exam Updated Vital Signs BP 115/84   Pulse 80   Temp 99 F (37.2 C) (Oral)   Resp 16   SpO2 98%   Physical Exam  Constitutional: She appears well-developed and well-nourished. No distress.  Awake, alert, nontoxic appearance  HENT:  Head: Normocephalic and atraumatic.  Mouth/Throat: Oropharynx is clear and moist. No oropharyngeal exudate.  Eyes: Conjunctivae are normal. No scleral icterus.  Neck: Normal range of motion. Neck supple.  Cardiovascular: Normal rate, regular rhythm and intact distal pulses.   Pulmonary/Chest: Effort normal and breath sounds normal. No respiratory distress. She has no wheezes.  Equal chest expansion  Abdominal: Soft. Bowel sounds are normal. She exhibits no mass. There is no hepatosplenomegaly. There is tenderness in the left lower quadrant. There is no rigidity, no rebound, no guarding, no tenderness at McBurney's point and negative Murphy's sign.  Musculoskeletal: Normal range of motion. She exhibits no edema.  Neurological: She is alert.  Speech is clear and goal oriented Moves extremities without ataxia  Skin: Skin is warm and dry. She is not diaphoretic.  Psychiatric: She has a normal mood and affect.  Nursing note and vitals reviewed.    ED Treatments / Results  Labs (all labs ordered are listed, but only abnormal results are displayed) Labs Reviewed  COMPREHENSIVE METABOLIC PANEL - Abnormal; Notable for the following:       Result Value   Glucose, Bld 115 (*)    All other components within normal limits  LIPASE, BLOOD  CBC  URINALYSIS, ROUTINE W REFLEX MICROSCOPIC     Radiology Ct Abdomen Pelvis W Contrast  Result Date: 09/03/2016 CLINICAL DATA:  Acute onset of left lower quadrant abdominal pain. Initial  encounter. EXAM: CT ABDOMEN AND PELVIS WITH CONTRAST TECHNIQUE: Multidetector CT imaging of the abdomen and pelvis was performed using the standard protocol following bolus administration of intravenous contrast. CONTRAST:  133mL ISOVUE-300 IOPAMIDOL (ISOVUE-300) INJECTION 61% COMPARISON:  CT of the abdomen and pelvis from 05/06/2015, and right upper quadrant ultrasound performed 05/10/2015 FINDINGS: Lower chest: Minimal bibasilar atelectasis is noted. The visualized portions of the mediastinum are unremarkable. Hepatobiliary: The liver is unremarkable in appearance. The patient is status post cholecystectomy, with clips noted at the gallbladder fossa. The common bile duct remains normal in caliber. Pancreas: The pancreas is within normal limits. Spleen: The spleen is unremarkable in appearance. Adrenals/Urinary Tract: The adrenal glands are unremarkable in appearance. The kidneys are within normal limits. There is no evidence of hydronephrosis. No renal or ureteral stones are identified. No perinephric stranding is seen. Stomach/Bowel: The stomach is unremarkable in appearance. The small bowel is within normal limits. The appendix is normal in caliber, without evidence  of appendicitis. Minimal diverticulosis is noted at the proximal sigmoid colon, without evidence of diverticulitis. Vascular/Lymphatic: Scattered calcification is seen along the abdominal aorta and its branches. The abdominal aorta is otherwise grossly unremarkable. The inferior vena cava is grossly unremarkable. No retroperitoneal lymphadenopathy is seen. No pelvic sidewall lymphadenopathy is identified. Reproductive: The bladder is decompressed and not well assessed. The patient is status post hysterectomy. No suspicious adnexal masses are seen. The left ovary is unremarkable in appearance. Other: No additional soft tissue abnormalities are seen. Musculoskeletal: No acute osseous abnormalities are identified. The visualized musculature is  unremarkable in appearance. IMPRESSION: 1. No acute abnormality seen within the abdomen or pelvis. 2. Minimal diverticulosis at the proximal sigmoid colon, without evidence of diverticulitis. 3. Scattered aortic atherosclerosis. Electronically Signed   By: Garald Balding M.D.   On: 09/03/2016 05:37    Procedures Procedures (including critical care time)  Medications Ordered in ED Medications  fentaNYL (SUBLIMAZE) injection 50 mcg (50 mcg Intravenous Given 09/03/16 0357)  ondansetron (ZOFRAN) injection 4 mg (4 mg Intravenous Given 09/03/16 0357)  iopamidol (ISOVUE-300) 61 % injection (100 mLs  Contrast Given 09/03/16 0504)     Initial Impression / Assessment and Plan / ED Course  I have reviewed the triage vital signs and the nursing notes.  Pertinent labs & imaging results that were available during my care of the patient were reviewed by me and considered in my medical decision making (see chart for details).     Patient presents with a four-quadrant abdominal pain. History of diverticulitis and perforation. His abdomen is not rigid or guarding. Patient is afebrile with normal vital signs. Labs are reassuring. CT scan shows no evidence of diverticulitis but does show diverticulosis. Patient's workup history and physical exam are consistent with diverticulitis. We'll begin treatment. She climbs further pain control here in the emergency department. Discussed reasons to return to emergency room including fever, chills, worsening pain, vomiting or other concerns. Patient states understanding and is in agreement with the plan. She'll be discharged home with Cipro, Flagyl and Zofran. I have offered pain control with narcotic pain medication and she has declined stating she wishes to take ibuprofen. Patient counseled about side effects of gastritis and peptic ulcer disease.  Final Clinical Impressions(s) / ED Diagnoses   Final diagnoses:  Left lower quadrant pain  Diverticulosis of large intestine  without hemorrhage    New Prescriptions Discharge Medication List as of 09/03/2016  5:57 AM    START taking these medications   Details  ciprofloxacin (CIPRO) 500 MG tablet Take 1 tablet (500 mg total) by mouth every 12 (twelve) hours., Starting Mon 09/03/2016, Print    metroNIDAZOLE (FLAGYL) 500 MG tablet Take 1 tablet (500 mg total) by mouth 3 (three) times daily., Starting Mon 09/03/2016, Print    ondansetron (ZOFRAN ODT) 4 MG disintegrating tablet 4mg  ODT q4 hours prn nausea/vomit, Print         Abigail Butts, PA-C 09/03/16 [id number]    Ripley Fraise, MD 09/03/16 [id number]

## 2016-09-03 NOTE — ED Notes (Signed)
EDP at bedside  

## 2016-09-03 NOTE — Discharge Instructions (Signed)
1. Medications: zofran, cipro, flagyl, usual home medications 2. Treatment: rest, drink plenty of fluids, advance diet slowly 3. Follow Up: Please followup with your primary doctor in 2 days for discussion of your diagnoses and further evaluation after today's visit; if you do not have a primary care doctor use the resource guide provided to find one; Please return to the ER for persistent vomiting, high fevers or worsening symptoms

## 2016-09-14 ENCOUNTER — Ambulatory Visit (INDEPENDENT_AMBULATORY_CARE_PROVIDER_SITE_OTHER): Payer: 59 | Admitting: Physician Assistant

## 2016-09-14 ENCOUNTER — Telehealth: Payer: Self-pay

## 2016-09-14 ENCOUNTER — Other Ambulatory Visit (INDEPENDENT_AMBULATORY_CARE_PROVIDER_SITE_OTHER): Payer: 59

## 2016-09-14 ENCOUNTER — Encounter: Payer: Self-pay | Admitting: Physician Assistant

## 2016-09-14 VITALS — BP 124/74 | HR 94 | Ht 66.25 in | Wt 198.2 lb

## 2016-09-14 DIAGNOSIS — K5732 Diverticulitis of large intestine without perforation or abscess without bleeding: Secondary | ICD-10-CM

## 2016-09-14 LAB — CBC WITH DIFFERENTIAL/PLATELET
BASOS PCT: 0.6 % (ref 0.0–3.0)
Basophils Absolute: 0.1 10*3/uL (ref 0.0–0.1)
Eosinophils Absolute: 0.1 10*3/uL (ref 0.0–0.7)
Eosinophils Relative: 0.9 % (ref 0.0–5.0)
HEMATOCRIT: 39.2 % (ref 36.0–46.0)
HEMOGLOBIN: 13.3 g/dL (ref 12.0–15.0)
LYMPHS PCT: 44.1 % (ref 12.0–46.0)
Lymphs Abs: 4 10*3/uL (ref 0.7–4.0)
MCHC: 33.9 g/dL (ref 30.0–36.0)
MCV: 94 fl (ref 78.0–100.0)
MONOS PCT: 5.7 % (ref 3.0–12.0)
Monocytes Absolute: 0.5 10*3/uL (ref 0.1–1.0)
NEUTROS ABS: 4.4 10*3/uL (ref 1.4–7.7)
Neutrophils Relative %: 48.7 % (ref 43.0–77.0)
Platelets: 289 10*3/uL (ref 150.0–400.0)
RBC: 4.17 Mil/uL (ref 3.87–5.11)
RDW: 12 % (ref 11.5–15.5)
WBC: 9 10*3/uL (ref 4.0–10.5)

## 2016-09-14 NOTE — Patient Instructions (Signed)
If you are age 52 or older, your body mass index should be between 23-30. Your Body mass index is 31.75 kg/m. If this is out of the aforementioned range listed, please consider follow up with your Primary Care Provider.  If you are age 52 or younger, your body mass index should be between 19-25. Your Body mass index is 31.75 kg/m. If this is out of the aformentioned range listed, please consider follow up with your Primary Care Provider.   Your physician has requested that you go to the basement for the following lab work before leaving today:  CBC  You have been given a handout on a low fiber/low residue diet.  You have been scheduled for a CT scan of the abdomen and pelvis at [facility] ([address]---this is in the same building as Press [facility]).   You are scheduled on Monday, March 12th at 4:00pm. You should arrive 15 minutes prior to your appointment time for registration. Please follow the written instructions below on the day of your exam:  WARNING: IF YOU ARE ALLERGIC TO IODINE/X-RAY DYE, PLEASE NOTIFY RADIOLOGY IMMEDIATELY AT [phone]! YOU WILL BE GIVEN A 13 HOUR PREMEDICATION PREP.  1) Do not eat anything after 12:00pm (4 hours prior to your test). **You may have liquids** 2) You have been given 2 bottles of oral contrast to drink. The solution may taste better if refrigerated, but do NOT add ice or any other liquid to this solution. Shake well before drinking.    Drink 1 bottle of contrast @ 2:00pm (2 hours prior to your exam)  Drink 1 bottle of contrast @ 3:00pm (1 hour prior to your exam)  You may take any medications as prescribed with a small amount of water except for the following: Metformin, Glucophage, Glucovance, Avandamet, Riomet, Fortamet, Actoplus Met, Janumet, Glumetza or Metaglip. The above medications must be held the day of the exam AND 48 hours after the exam.  The purpose of you drinking the oral contrast is to aid in the  visualization of your intestinal tract. The contrast solution may cause some diarrhea. Before your exam is started, you will be given a small amount of fluid to drink. Depending on your individual set of symptoms, you may also receive an intravenous injection of x-ray contrast/dye. Plan on being at [facility] for 30 minutes or longer, depending on the type of exam you are having performed.  This test typically takes 30-45 minutes to complete.  If you have any questions regarding your exam or if you need to reschedule, you may call the CT department at [phone] between the hours of 8:00 am and 5:00 pm, Monday-Friday.  ________________________________________________________________________  Please follow up as needed with Santina Evans.  You have been scheduled for a follow up with [facility] on Tuesday, March 27th at 8:45am.  Please arrive 15 minutes early for this appointment. If you need to re-schedule this appointment please call [phone].   Thank you.

## 2016-09-14 NOTE — Telephone Encounter (Signed)
Per Lorriane Shire pts ins plan will not cover CT scan if it is performed at [place].  Procedure switched to [name] on March 13th at 2:30pm. Pt was informed to drink 1st bottle of contrast at 12:30pm and 2nd at 1:30pm. Pt understood.

## 2016-09-14 NOTE — Progress Notes (Addendum)
Chief Complaint: Recurrent Diverticulitis  HPI:  [name] is a 51 year old female with a past medical history of anemia, diverticulitis with microperforation, GERD and others listed below, who typically follows with Dr. Hilarie Fredrickson, and presents to clinic today after recent diagnosis of recurrent diverticulitis.    Per chart review patient was recently seen in the ED on 09/03/16 with a worsening left lower quadrant abdominal pain which had started 4 days previous, rated as a 10/ 10. Associated symptoms included nausea. Patient had a CT of the abdomen and pelvis with no acute abnormality seen within the abdomen or pelvis. Minimal diverticulosis of the proximal sigmoid colon, without evidence of diverticulitis and scattered aortic atherosclerosis. Patient was started on ciprofloxacin and Flagyl empirically.   Patient was last seen in our clinic on 05/10/15 by Dr. Havery Moros emergently after diagnosis of diverticulitis. It was noted at that time that the patient's first episode of diverticulitis required a five-day hospital admission due to a microperforation. Patient avoided surgery at that time and was on prolonged bowel rest during admission. After last episode in November the patient was doing better on oral antibiotics after 4 days. Patient's last colonoscopy was 4/13 with mild left-sided diverticulosis, rectosigmoid hyperplastic polyp. Patient did discuss diverticulitis with surgical team around that time, but it was not thought that patient's symptoms warranted sigmoidectomy yet. She was advised to return to the clinic for any episodes of repeat diverticulitis.   Today, the patient tells me that on 08/29/16 she developed a left lower quadrant/left sided abdominal pain which wrapped around to her back, this lasted over the weekend and she started with a low-grade fever on Monday which made her go to the ER. She tells me that while there they told her she did not have signs of diverticulitis on the CT but  they gave her Cipro and Flagyl for 10 days. Patient tells me she took this and finished it and the pain is still an 8/10 constantly. This seems to be somewhat worse 5 minutes after eating and if she gets up and moves around or uses the bathroom. Patient does tell me that this is very "typical for my diverticulitis". She discusses that she has had chronic left lower quadrant/left sided pain over the past year actually. She notes that typically this will resolve 1-2 days after it starts, but is at least 2-3 times a month. She typically just "ignores this", if she doesn't run a fever or have other symptoms. This most recent episode was brought on after some days of constipation. Currently the patient has had normal, "softer than normal" bowel movements over the past couple of days. Patient does tell me that typically her episodes are associated with nausea and some vomiting as well as this last one. Patient has not vomited since being seen in the ED and started on Zofran. Patient tells me she has had trouble sleeping over the past couple of weeks because she just "can't get comfortable". The patient is very frustrated regarding her return of symptoms.   Patient denies current fever, chills, blood in her stool, melena, weight loss, fatigue, anorexia, heartburn or reflux.  Past Medical History:  Diagnosis Date  . Anemia    hx of prior to hysterectomy  . BPPV (benign paroxysmal positional vertigo) 05/07/11   Described as room spinning, notes 2 episodes prior to Oct 2012.  Plans to undergo vestibular rehab.  . Diverticulitis    with microperforation  . Diverticulosis   . GERD (gastroesophageal reflux disease)   .  Hyperplastic colon polyp   . Hypertension 05/07/11   with grade 2 diastolic dysfunction not on meds x 3 years   . PVC (premature ventricular contraction) 05/07/11   incidentally seen on telemetry    Past Surgical History:  Procedure Laterality Date  . CERVICAL BIOPSY  W/ LOOP ELECTRODE  EXCISION    . LAPAROSCOPIC CHOLECYSTECTOMY SINGLE SITE WITH INTRAOPERATIVE CHOLANGIOGRAM N/A 05/27/2015   Procedure: LAPAROSCOPIC CHOLECYSTECTOMY SINGLE SITE WITH INTRAOPERATIVE CHOLANGIOGRAM;  Surgeon: Michael Boston, MD;  Location: [facility];  Service: General;  Laterality: N/A;  . TONSILLECTOMY    . UMBILICAL HERNIA REPAIR     Approximately 1974  . VAGINAL HYSTERECTOMY  07/2010    Current Outpatient Prescriptions  Medication Sig Dispense Refill  . ciprofloxacin (CIPRO) 500 MG tablet Take 1 tablet (500 mg total) by mouth every 12 (twelve) hours. 20 tablet 0  . famotidine (PEPCID) 20 MG tablet Take 20 mg by mouth daily as needed for heartburn or indigestion.    . metroNIDAZOLE (FLAGYL) 500 MG tablet Take 1 tablet (500 mg total) by mouth 3 (three) times daily. 30 tablet 0  . Multiple Vitamin (MULITIVITAMIN WITH MINERALS) TABS Take 1 tablet by mouth daily.    . ondansetron (ZOFRAN ODT) 4 MG disintegrating tablet 4mg  ODT q4 hours prn nausea/vomit 4 tablet 0   No current facility-administered medications for this visit.     Allergies as of 09/14/2016 - Review Complete 09/03/2016  Allergen Reaction Noted  . Lisinopril Cough 05/17/2011  . Morphine and related Nausea And Vomiting 05/27/2015  . Latex Itching and Rash 05/23/2015    Family History  Problem Relation Age of Onset  . Diabetes Mother   . Breast cancer Mother   . Diabetes Maternal Grandmother   . Cervical cancer Maternal Aunt   . Deep vein thrombosis Father     died from cerebral embolus  . Colon cancer Neg Hx     Social History   Social History  . Marital status: Single    Spouse name: N/A  . Number of children: 2  . Years of education: N/A   Occupational History  .  [employer]   Social History Main Topics  . Smoking status: Former Smoker    Packs/day: 0.10    Years: 20.00    Types: Cigarettes    Quit date: 07/09/2005  . Smokeless tobacco: Never Used     Comment: 1 black & mild a week  . Alcohol use Yes      Comment: once every few months   . Drug use: No  . Sexual activity: Yes    Birth control/ protection: None   Other Topics Concern  . Not on file   Social History Narrative   Has two children and one granddaughter. Lives by herself.    Review of Systems:    Constitutional: No weight loss, weakness or fatigue Skin: No rash  Cardiovascular: No chest pain Respiratory: No SOB  Gastrointestinal: See HPI and otherwise negative Genitourinary: No dysuria  Neurological: No headache Musculoskeletal: No new muscle or joint pain Hematologic: No bleeding  Psychiatric: No history of depression or anxiety   Physical Exam:  Vital signs: BP 124/74   Pulse 94   Ht 5' 6.25" (1.683 m)   Wt 198 lb 3.2 oz (89.9 kg)   SpO2 95%   BMI 31.75 kg/m    Constitutional:   Pleasant African American female appears to be in NAD, Well developed, Well nourished, alert and cooperative Head:  Normocephalic and  atraumatic. Eyes:   PEERL, EOMI. No icterus. Conjunctiva pink. Ears:  Normal auditory acuity. Neck:  Supple Throat: Oral cavity and pharynx without inflammation, swelling or lesion.  Respiratory: Respirations even and unlabored. Lungs clear to auscultation bilaterally.   No wheezes, crackles, or rhonchi.  Cardiovascular: Normal S1, S2. No MRG. Regular rate and rhythm. No peripheral edema, cyanosis or pallor.  Gastrointestinal:  Soft, nondistended, mod ttp in LLQ, LLQ referred pain with palpation in RLQ Normal bowel sounds. No appreciable masses or hepatomegaly. Rectal:  Not performed.  Msk:  Symmetrical without gross deformities. Without edema, no deformity or joint abnormality.  Neurologic:  Alert and  oriented x4;  grossly normal neurologically.  Skin:   Dry and intact without significant lesions or rashes. Psychiatric:  Demonstrates good judgement and reason without abnormal affect or behaviors.  MOST RECENT LABS AND IMAGING: CBC    Component Value Date/Time   WBC 9.8 09/03/2016 0248   RBC  3.99 09/03/2016 0248   HGB 12.6 09/03/2016 0248   HCT 37.2 09/03/2016 0248   PLT 280 09/03/2016 0248   MCV 93.2 09/03/2016 0248   MCH 31.6 09/03/2016 0248   MCHC 33.9 09/03/2016 0248   RDW 12.1 09/03/2016 0248   LYMPHSABS 3.5 08/24/2011 1623   MONOABS 1.2 (H) 08/24/2011 1623   EOSABS 0.0 08/24/2011 1623   BASOSABS 0.0 08/24/2011 1623    CMP     Component Value Date/Time   NA 139 09/03/2016 0248   K 3.6 09/03/2016 0248   CL 108 09/03/2016 0248   CO2 24 09/03/2016 0248   GLUCOSE 115 (H) 09/03/2016 0248   BUN 16 09/03/2016 0248   CREATININE 0.82 09/03/2016 0248   CREATININE 0.79 09/18/2013 1551   CALCIUM 10.0 09/03/2016 0248   PROT 7.2 09/03/2016 0248   ALBUMIN 4.0 09/03/2016 0248   AST 19 09/03/2016 0248   ALT 15 09/03/2016 0248   ALKPHOS 73 09/03/2016 0248   BILITOT 0.5 09/03/2016 0248   GFRNONAA >60 09/03/2016 0248   GFRNONAA 89 09/18/2013 1551   GFRAA >60 09/03/2016 0248   GFRAA >89 09/18/2013 1551   Ct Abdomen Pelvis W Contrast  Result Date: 09/03/2016 CLINICAL DATA:  Acute onset of left lower quadrant abdominal pain. Initial encounter. EXAM: CT ABDOMEN AND PELVIS WITH CONTRAST TECHNIQUE: Multidetector CT imaging of the abdomen and pelvis was performed using the standard protocol following bolus administration of intravenous contrast. CONTRAST:  158mL ISOVUE-300 IOPAMIDOL (ISOVUE-300) INJECTION 61% COMPARISON:  CT of the abdomen and pelvis from 05/06/2015, and right upper quadrant ultrasound performed 05/10/2015 FINDINGS: Lower chest: Minimal bibasilar atelectasis is noted. The visualized portions of the mediastinum are unremarkable. Hepatobiliary: The liver is unremarkable in appearance. The patient is status post cholecystectomy, with clips noted at the gallbladder fossa. The common bile duct remains normal in caliber. Pancreas: The pancreas is within normal limits. Spleen: The spleen is unremarkable in appearance. Adrenals/Urinary Tract: The adrenal glands are  unremarkable in appearance. The kidneys are within normal limits. There is no evidence of hydronephrosis. No renal or ureteral stones are identified. No perinephric stranding is seen. Stomach/Bowel: The stomach is unremarkable in appearance. The small bowel is within normal limits. The appendix is normal in caliber, without evidence of appendicitis. Minimal diverticulosis is noted at the proximal sigmoid colon, without evidence of diverticulitis. Vascular/Lymphatic: Scattered calcification is seen along the abdominal aorta and its branches. The abdominal aorta is otherwise grossly unremarkable. The inferior vena cava is grossly unremarkable. No retroperitoneal lymphadenopathy is seen. No pelvic sidewall  lymphadenopathy is identified. Reproductive: The bladder is decompressed and not well assessed. The patient is status post hysterectomy. No suspicious adnexal masses are seen. The left ovary is unremarkable in appearance. Other: No additional soft tissue abnormalities are seen. Musculoskeletal: No acute osseous abnormalities are identified. The visualized musculature is unremarkable in appearance. IMPRESSION: 1. No acute abnormality seen within the abdomen or pelvis. 2. Minimal diverticulosis at the proximal sigmoid colon, without evidence of diverticulitis. 3. Scattered aortic atherosclerosis. Electronically Signed   By: Garald Balding M.D.   On: 09/03/2016 05:37   Assessment: 1. Recurrent diverticulitis: Patient has been seen in our office now for 3 episodes of diverticulitis, last 05/10/15, at that time it was discussed she could consider surgical referral/consultation for possible sigmoid resection, did see surgical team in 05/18/15 and at that time it was discussed that if she had a third attack she could see them again for reconsultation or definitely after a fourth attack, but at that time resection was not felt necessary as only the first episode was complicated by microperforation, now with 3rd episode and  year long symptoms of LLQ pain, recommend surgical eval  Plan: 1. Patient continues with pain which is still rated as an 8/10, no change after being on 10 days of antibiotics, it is somewhat reassuring as she has not been running a fever or had blood in her bowel movements or had an increase in pain, but would  recommend repeat imaging at this time to ensure that she has not gotten any worse. 2. Ordered repeat CT the abdomen and pelvis with contrast 3. Ordered a CBC to recheck white count which was normal at time of ER visit on 2/26 4. Recommend patient maintain a low fiber/ low residue diet over the next week or 2 5. Patient was re-referred to see [facility] for consultation of recurrent diverticulitis and possible sigmoidectomy, patient tells me today she has had symptoms at least 3 times a month for 1-2 days at time of left lower quadrant abdominal pain over the past year 6. Patient to follow in clinic with Dr. Hilarie Fredrickson or myself per recommendations after imaging above  Ellouise Newer, PA-C [facility] 09/14/2016, 11:22 AM  Cc: No ref. provider found   Addendum: Reviewed and agree with initial management. Jerene Bears, MD

## 2016-09-17 ENCOUNTER — Other Ambulatory Visit: Payer: 59

## 2016-09-18 ENCOUNTER — Ambulatory Visit (HOSPITAL_COMMUNITY)
Admission: RE | Admit: 2016-09-18 | Discharge: 2016-09-18 | Disposition: A | Discharge status: Home / Self Care | Payer: 59 | Source: Ambulatory Visit | Attending: Physician Assistant | Admitting: Physician Assistant

## 2016-09-18 ENCOUNTER — Encounter (HOSPITAL_COMMUNITY): Payer: Self-pay

## 2016-09-18 DIAGNOSIS — K5732 Diverticulitis of large intestine without perforation or abscess without bleeding: Secondary | ICD-10-CM

## 2016-09-18 DIAGNOSIS — M5136 Other intervertebral disc degeneration, lumbar region: Secondary | ICD-10-CM | POA: Diagnosis not present

## 2016-09-18 DIAGNOSIS — K5730 Diverticulosis of large intestine without perforation or abscess without bleeding: Secondary | ICD-10-CM | POA: Diagnosis not present

## 2016-09-18 DIAGNOSIS — I700 Atherosclerosis of aorta: Secondary | ICD-10-CM | POA: Insufficient documentation

## 2016-09-18 MED ORDER — IOPAMIDOL (ISOVUE-300) INJECTION 61%
INTRAVENOUS | Status: AC
  Filled 2016-09-18: qty 100

## 2016-09-18 MED ORDER — IOPAMIDOL (ISOVUE-300) INJECTION 61%
100.0000 mL | Freq: Once | INTRAVENOUS | Status: AC | PRN
  Administered 2016-09-18: 100 mL via INTRAVENOUS

## 2016-10-02 ENCOUNTER — Telehealth: Payer: Self-pay

## 2016-10-02 NOTE — Telephone Encounter (Signed)
-----   Message from [place], [place] sent at 10/02/2016  1:17 PM EDT ----- Regarding: pt needs colo with pyrtle Pt just saw surgery for consult regarding recurrent diverticulitis/ LLQ abdominal pain. They recommended Colonoscopy for further eval before pursuing any further surgery.  Please set up with Dr. Hilarie Fredrickson in the [facility].   Thank-JLL

## 2016-10-02 NOTE — Telephone Encounter (Signed)
Left message on machine to call back  

## 2016-10-03 NOTE — Telephone Encounter (Signed)
The pt has been scheduled for previsit and colon.  She has been notified.  

## 2016-10-10 ENCOUNTER — Encounter: Payer: Self-pay | Admitting: Internal Medicine

## 2016-10-10 ENCOUNTER — Ambulatory Visit (AMBULATORY_SURGERY_CENTER): Payer: Self-pay

## 2016-10-10 VITALS — Ht 66.5 in | Wt 205.0 lb

## 2016-10-10 DIAGNOSIS — K5730 Diverticulosis of large intestine without perforation or abscess without bleeding: Secondary | ICD-10-CM

## 2016-10-10 MED ORDER — NA SULFATE-K SULFATE-MG SULF 17.5-3.13-1.6 GM/177ML PO SOLN: 1.0000 | Freq: Once | ORAL | 0 refills | Status: AC

## 2016-10-10 NOTE — Progress Notes (Signed)
Denies allergies to eggs or soy products. Denies complication of anesthesia or sedation. Denies use of weight loss medication. Denies use of O2.   Emmi instructions given for colonoscopy.  

## 2016-10-17 ENCOUNTER — Ambulatory Visit (AMBULATORY_SURGERY_CENTER): Payer: 59 | Admitting: Internal Medicine

## 2016-10-17 ENCOUNTER — Encounter: Payer: Self-pay | Admitting: Internal Medicine

## 2016-10-17 VITALS — BP 122/78 | HR 59 | Temp 99.5°F | Resp 15 | Ht 66.25 in | Wt 198.0 lb

## 2016-10-17 DIAGNOSIS — D122 Benign neoplasm of ascending colon: Secondary | ICD-10-CM

## 2016-10-17 DIAGNOSIS — K5730 Diverticulosis of large intestine without perforation or abscess without bleeding: Secondary | ICD-10-CM

## 2016-10-17 MED ORDER — SODIUM CHLORIDE 0.9 % IV SOLN: 500.0000 mL | INTRAVENOUS | Status: DC

## 2016-10-17 NOTE — Progress Notes (Signed)
Called to room to assist during endoscopic procedure.  Patient ID and intended procedure confirmed with present staff. Received instructions for my participation in the procedure from the performing physician.  

## 2016-10-17 NOTE — Progress Notes (Signed)
No change in medical or surgical hx since PV

## 2016-10-17 NOTE — Op Note (Signed)
[facility] Patient Name: Tashawn Greff Procedure Date: 10/17/2016 1:39 PM MRN: [id number] Endoscopist: Jerene Bears , MD Age: 51 Referring MD:  Date of Birth: 1966/07/01 Gender: Female Account #: [id number] Procedure:                Colonoscopy Indications:              Abdominal pain in the left lower quadrant,                            Follow-up of diverticulitis Medicines:                Monitored Anesthesia Care Procedure:                Pre-Anesthesia Assessment:                           - Prior to the procedure, a History and Physical                            was performed, and patient medications and                            allergies were reviewed. The patient's tolerance of                            previous anesthesia was also reviewed. The risks                            and benefits of the procedure and the sedation                            options and risks were discussed with the patient.                            All questions were answered, and informed consent                            was obtained. Prior Anticoagulants: The patient has                            taken no previous anticoagulant or antiplatelet                            agents. ASA Grade Assessment: II - A patient with                            mild systemic disease. After reviewing the risks                            and benefits, the patient was deemed in                            satisfactory condition to undergo the procedure.  After obtaining informed consent, the colonoscope                            was passed under direct vision. Throughout the                            procedure, the patient's blood pressure, pulse, and                            oxygen saturations were monitored continuously. The                            Model CF-HQ190L [id number]) scope was introduced                            through the anus and advanced to the  the cecum,                            identified by appendiceal orifice and ileocecal                            valve. The colonoscopy was performed without                            difficulty. The patient tolerated the procedure                            well. The quality of the bowel preparation was                            good. The ileocecal valve, appendiceal orifice, and                            rectum were photographed. Scope In: 1:44:07 PM Scope Out: 1:57:23 PM Scope Withdrawal Time: 0 hours 8 minutes 23 seconds  Total Procedure Duration: 0 hours 13 minutes 16 seconds  Findings:                 The digital rectal exam was normal.                           A 3 mm polyp was found in the ascending colon. The                            polyp was sessile. The polyp was removed with a                            cold biopsy forceps. Resection and retrieval were                            complete.                           Multiple small and large-mouthed diverticula were  found in the recto-sigmoid colon, sigmoid colon and                            distal descending colon. Erythema was seen in                            association with the diverticular opening.                           Internal hemorrhoids were found during                            retroflexion. The hemorrhoids were medium-sized. Complications:            No immediate complications. Estimated Blood Loss:     Estimated blood loss was minimal. Impression:               - One 3 mm polyp in the ascending colon, removed                            with a cold biopsy forceps. Resected and retrieved.                           - Mild diverticulosis in the recto-sigmoid colon,                            in the sigmoid colon and in the distal descending                            colon. Erythema was seen in association with the                            diverticular opening.                            - Internal hemorrhoids. Recommendation:           - Patient has a contact number available for                            emergencies. The signs and symptoms of potential                            delayed complications were discussed with the                            patient. Return to normal activities tomorrow.                            Written discharge instructions were provided to the                            patient.                           - Resume previous diet.                           -  Continue present medications.                           - Await pathology results.                           - Repeat colonoscopy date to be determined after                            pending pathology results are reviewed for                            surveillance. Jerene Bears, MD 10/17/2016 2:06:14 PM This report has been signed electronically.

## 2016-10-17 NOTE — Progress Notes (Signed)
A and O x3. Report to RN. Tolerated MAC anesthesia well.

## 2016-10-18 ENCOUNTER — Telehealth: Payer: Self-pay

## 2016-10-18 NOTE — Telephone Encounter (Signed)
  Follow up Call-  Call back number 10/17/2016  Post procedure Call Back phone  # [phone]  Permission to leave phone message Yes  Some recent data might be hidden     Patient questions:  Do you have a fever, pain , or abdominal swelling? No. Pain Score  0 *  Have you tolerated food without any problems? Yes.    Have you been able to return to your normal activities? Yes.    Do you have any questions about your discharge instructions: Diet   No. Medications  No. Follow up visit  No.  Do you have questions or concerns about your Care? No.  Actions: * If pain score is 4 or above: No action needed, pain <4.

## 2016-10-23 ENCOUNTER — Encounter: Payer: Self-pay | Admitting: Internal Medicine

## 2016-11-13 ENCOUNTER — Ambulatory Visit: Payer: Self-pay | Admitting: Surgery

## 2016-11-13 NOTE — H&P (Signed)
[name] 11/13/2016 8:54 AM Location: [facility] Patient #: [id number] DOB: 13-May-1966 Single / Language: Cleophus Molt / Race: Black or African American Female  Patient Care Team: Default, Provider, MD as PCP - Huston Foley, MD as Consulting Physician (General Surgery) Armbruster, Renelda Loma, MD as Consulting Physician (Gastroenterology) Pyrtle, Lajuan Lines, MD as Consulting Physician (Gastroenterology)   History of Present Illness Adin Hector MD; 11/13/2016 9:31 AM) The patient is a 51 year old female who presents with diverticulitis. Note for "Diverticulitis": Patient returns with repeated episodes of diverticulitis.   Surgical consultation requested by her gastroenterologist, Dr. Havery Moros & Pyrtle, and Ellouise Newer PA with [facility].  Pleasant woman. At at least one documented evidence of diverticulitis in 2013. Colonoscopy showed sigmoid diverticulosis mild and proximal sigmoid colon. Hyperplastic polyp removed. No edematous polyps or any other issues. Had another flare in 2016. Never perforated. No abscess or fistula formation. Underwent cholecystectomy 4259 for biliary colic. Because she had had only 2 non-complicated flares, we held off on any sigmoid colectomy at that time. I have not seen her then. Patient notes that she has had intermittent flares of left lower quadrant pain for the past couple years. Usually eats once or twice a month. She'll get all low grade fever and LLQ pain. Crampiness can last for a couple hours. Occasionally can last all day.   I saw her last month. Suspicious for diverticulitis but some CT scans not showing definite diverticulitis. I recommended colonoscopy to rule out other etiologies. Diverticulosis noted with diverticulitis noted on endoscopy. Small tubular adenoma noted. Some narrowing but no major stricture in the sigmoid colon. Rest evaluation underwhelming. Colectomy recommended.  Patient notes she has been on oral antibiotics since then. She's having a good week without pain but notes that several times a month she will get recurrent attacks of pain. She started to other people that of had colectomies for diverticulitis with good results. She is very interested in proceeding with surgery to cut the problem area out and break the cycle pain and attacks.  No personal nor family history of GI/colon cancer, inflammatory bowel disease, irritable bowel syndrome, allergy such as Celiac Sprue, dietary/dairy problems, colitis, ulcers nor gastritis. No recent sick contacts/gastroenteritis. No travel outside the country. No changes in diet. No dysphagia to solids or liquids. No significant heartburn or reflux. No hematochezia, hematemesis, coffee ground emesis. No evidence of prior gastric/peptic ulceration.   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 Colon Polyp Removal - Colonoscopy  Hysterectomy (not due to cancer) - Complete  Oral Surgery  Tonsillectomy   Diagnostic Studies History Adin Hector, MD; 11/13/2016 9:10 AM) Colonoscopy  1-5 years ago Mammogram  within last year Pap Smear  1-5 years ago  Allergies Lars Mage Spillers, CMA; 11/13/2016 8:55 AM) Morphine Sulfate (Concentrate) *ANALGESICS - OPIOID*  Latex Exam Gloves *MEDICAL DEVICES AND SUPPLIES*   Medication History Illene Regulus, CMA; 11/13/2016 8:55 AM) Medications Reconciled  Social History Adin Hector, MD; 11/13/2016 9:10 AM) Alcohol use  Occasional alcohol use. Caffeine use   Coffee. No drug use  Tobacco use  Former smoker.  Family History Adin Hector, MD;  11/13/2016 9:10 AM) Breast Cancer  Mother. Cervical Cancer  Family Members In General. Diabetes Mellitus  Mother. Hypertension  Father, Mother.  Pregnancy / Birth History Adin Hector, MD; 11/13/2016 9:10 AM) Age at menarche  7 years. Age of menopause  43-50 Gravida  3 Maternal age  79-20 Para  2  Other Problems Adin Hector, MD; 11/13/2016 9:10 AM) Cholelithiasis  Diverticulosis  Gastroesophageal Reflux Disease  High blood pressure  Other disease, cancer, significant illness     Review of Systems Adin Hector, MD; 11/13/2016 9:10 AM) General Present- Appetite Loss. Not Present- Chills, Fatigue, Fever, Night Sweats, Weight Gain and Weight Loss. Skin Not Present- Change in Wart/Mole, Dryness, Hives, Jaundice, New Lesions, Non-Healing Wounds, Rash and Ulcer. HEENT Present- Wears glasses/contact lenses. Not Present- Earache, Hearing Loss, Hoarseness, Nose Bleed, Oral Ulcers, Ringing in the Ears, Seasonal Allergies, Sinus Pain, Sore Throat, Visual Disturbances and Yellow Eyes. Respiratory Not Present- Bloody sputum, Chronic Cough, Difficulty Breathing, Snoring and Wheezing. Breast Not Present- Breast Mass, Breast Pain, Nipple Discharge and Skin Changes. Cardiovascular Not Present- Chest Pain, Difficulty Breathing Lying Down, Leg Cramps, Palpitations, Rapid Heart Rate, Shortness of Breath and Swelling of Extremities. Gastrointestinal Present- Abdominal Pain, Bloating, Change in Bowel Habits, Excessive gas, Indigestion and Nausea. Not Present- Bloody Stool, Chronic diarrhea, Constipation, Difficulty Swallowing, Gets full quickly at meals, Hemorrhoids, Rectal Pain and Vomiting. Female Genitourinary Not Present- Frequency, Nocturia, Painful Urination, Pelvic Pain and Urgency. Musculoskeletal Present- Back Pain. Not Present- Joint Pain, Joint Stiffness, Muscle Pain, Muscle Weakness  and Swelling of Extremities. Neurological Not Present- Decreased Memory, Fainting, Headaches, Numbness, Seizures, Tingling, Tremor, Trouble walking and Weakness. Psychiatric Not Present- Anxiety, Bipolar, Change in Sleep Pattern, Depression, Fearful and Frequent crying. Endocrine Present- Hot flashes. Not Present- Cold Intolerance, Excessive Hunger, Hair Changes, Heat Intolerance and New Diabetes. Hematology Not Present- Easy Bruising, Excessive bleeding, Gland problems, HIV and Persistent Infections.  Vitals (Alisha Spillers CMA; 11/13/2016 8:54 AM) 11/13/2016 8:54 AM Weight: 203 lb Height: 66in Body Surface Area: 2.01 m Body Mass Index: 32.76 kg/m  Pulse: 82 (Regular)  BP: 130/82 (Sitting, Left Arm, Standard)       Physical Exam Adin Hector MD; 11/13/2016 9:29 AM) General Mental Status-Alert. General Appearance-Not in acute distress, Not Sickly. Orientation-Oriented X3. Hydration-Well hydrated. Voice-Normal.  Integumentary Global Assessment Normal Exam - Axillae: non-tender, no inflammation or ulceration, no drainage. and Distribution of scalp and body hair is normal. General Characteristics Temperature - normal warmth is noted.  Head and Neck Head-normocephalic, atraumatic with no lesions or palpable masses. Face Global Assessment - atraumatic, no absence of expression. Neck Global Assessment - no abnormal movements, no bruit auscultated on the right, no bruit auscultated on the left, no decreased range of motion, non-tender. Trachea-midline. Thyroid Gland Characteristics - non-tender.  Eye Eyeball - Left-Extraocular movements intact, No Nystagmus. Eyeball - Right-Extraocular movements intact, No Nystagmus. Cornea - Left-No Hazy. Cornea - Right-No Hazy. Sclera/Conjunctiva - Left-No scleral icterus, No Discharge. Sclera/Conjunctiva - Right-No scleral icterus, No Discharge. Pupil - Left-Direct reaction to light normal. Pupil -  Right-Direct reaction to light normal.  ENMT Ears Pinna - Left - no drainage observed, no generalized tenderness observed. Right - no drainage observed, no generalized tenderness observed. Nose and Sinuses Nose - no destructive lesion observed. Nares - Left - quiet respiration. Right - quiet respiration. Mouth and Throat Lips - Upper Lip - no fissures observed, no pallor noted. Lower Lip - no fissures observed, no pallor noted. Nasopharynx - no discharge  present. Oral Cavity/Oropharynx - Tongue - no dryness observed. Oral Mucosa - no cyanosis observed. Hypopharynx - no evidence of airway distress observed.  Chest and Lung Exam Inspection Movements - Normal and Symmetrical. Accessory muscles - No use of accessory muscles in breathing. Palpation Normal exam - Non-tender. Auscultation Breath sounds - Normal and Clear.  Cardiovascular Auscultation Rhythm - Regular. Murmurs & Other Heart Sounds - Normal exam - No Murmurs and No Systolic Clicks.  Abdomen Inspection Normal Exam - No Visible peristalsis and No Abnormal pulsations. Umbilicus - No Bleeding, No Urine drainage. Palpation/Percussion Normal exam - Soft, Non Tender, No Rebound tenderness, No Rigidity (guarding) and No Cutaneous hyperesthesia. Note: Abdomen soft.  Minimal discomfort left lower quadrant. Otherwise nontender, nondistended. No guarding. No diastasis. No umbilical nor other hernias   Female Genitourinary Sexual Maturity Tanner 5 - Adult hair pattern. Note: No vaginal bleeding nor discharge   Peripheral Vascular Upper Extremity Inspection - Left - No Cyanotic nailbeds, Not Ischemic. Right - No Cyanotic nailbeds, Not Ischemic.  Neurologic Neurologic evaluation reveals -normal attention span and ability to concentrate, able to name objects and repeat phrases. Appropriate fund of knowledge , normal sensation and normal coordination. Mental Status Affect - not angry, not paranoid. Cranial  Nerves-Normal Bilaterally. Gait-Normal.  Neuropsychiatric Mental status exam performed with findings of-able to articulate well with normal speech/language, rate, volume and coherence, thought content normal with ability to perform basic computations and apply abstract reasoning and no evidence of hallucinations, delusions, obsessions or homicidal/suicidal ideation.  Musculoskeletal Global Assessment Spine, Ribs and Pelvis - no instability, subluxation or laxity. Right Upper Extremity - no instability, subluxation or laxity.  Lymphatic Head & Neck General Head & Neck Lymphatics: Bilateral - Description - No Localized lymphadenopathy. Axillary General Axillary Region: Bilateral - Description - No Localized lymphadenopathy. Femoral & Inguinal Generalized Femoral & Inguinal Lymphatics: Left: Right - Description - No Localized lymphadenopathy. Description - No Localized lymphadenopathy.    Assessment & Plan Adin Hector MD; 11/13/2016 9:27 AM) DIVERTICULITIS OF LARGE INTESTINE WITHOUT PERFORATION OR ABSCESS WITHOUT BLEEDING (K57.32) Impression: Evidence of diverticulitis and sigmoid colon classic location of her pain with persistent recurrent episodes of pain. At least 2 episodes of diverticulitis documented. The differential diagnosis seems unlikely.  I think she would benefit from robotically assisted sigmoid colectomy. She is very interested in proceeding. She is hopeful that removing the pulmonary will help break the cycle attacks. Imus as well. Good candidate for a minimally invasive/robotic approach. CHRONIC CONSTIPATION (K59.09) Current Plans Pt Education - [facility] Good Bowel Health (Brennan Karam) DIVERTICULOSIS LARGE INTESTINE W/O PERFORATION OR ABSCESS W/O BLEEDING (K57.30) Current Plans Pt Education - [facility] Diverticular Disease (AT) PREOP COLON - ENCOUNTER FOR PREOPERATIVE EXAMINATION FOR GENERAL SURGICAL PROCEDURE (Z01.818) Current Plans You are being scheduled for surgery- Our  schedulers will call you.  You should hear from our office's scheduling department within 5 working days about the location, date, and time of surgery. We try to make accommodations for patient's preferences in scheduling surgery, but sometimes the OR schedule or the surgeon's schedule prevents [place] from making those accommodations.  If you have not heard from our office [phone]) in 5 working days, call the office and ask for your surgeon's nurse.  If you have other questions about your diagnosis, plan, or surgery, call the office and ask for your surgeon's nurse.  Written instructions provided Pt Education - Pamphlet Given - Laparoscopic Colorectal Surgery: discussed with patient and provided information. Pt Education - [facility] Colon Bowel Prep  2015 Miralax/Antibiotics Started Neomycin Sulfate 500MG , 2 (two) Tablet SEE NOTE, #6, 11/13/2016, No Refill. Local Order: TAKE TWO TABLETS AT 2 PM, 3 PM, AND 10 PM THE DAY PRIOR TO SURGERY Started Flagyl 500MG , 2 (two) Tablet SEE NOTE, #6, 11/13/2016, No Refill. Local Order: Take at 2pm, 3pm, and 10pm the day prior to your colon operation The anatomy & physiology of the digestive tract was discussed. The pathophysiology of the colon was discussed. Natural history risks without surgery was discussed. I feel the risks of no intervention will lead to serious problems that outweigh the operative risks; therefore, I recommended a partial colectomy to remove the pathology. Minimally invasive (Robotic/Laparoscopic) & open techniques were discussed.  Risks such as bleeding, infection, abscess, leak, reoperation, possible ostomy, hernia, heart attack, death, and other risks were discussed. I noted a good likelihood this will help address the problem. Goals of post-operative recovery were discussed as well. Need for adequate nutrition, daily bowel regimen and healthy physical activity, to optimize recovery was noted as well. We will work to minimize  complications. Educational materials were available as well. Questions were answered. The patient expresses understanding & wishes to proceed with surgery.  Pt Education - [facility] Colectomy post-op instructions: discussed with patient and provided information.  Adin Hector, M.D., F.A.C.S. Gastrointestinal and Minimally Invasive Surgery [facility] [address], [place], [place] [address] [phone] Main / Paging

## 2016-11-30 NOTE — Patient Instructions (Signed)
[name]  11/30/2016   Your procedure is scheduled on: 12-07-16  Report to [facility] Main  Entrance Take [place]  elevators to 3rd floor to  [place] at [phone].   Call this number if you have problems the morning of surgery [phone]   Remember: ONLY 1 PERSON MAY GO WITH YOU TO SHORT STAY TO GET  READY MORNING OF YOUR SURGERY.  Do not eat food After Midnight on Wednesday 12-05-16. Drink plenty of clear liquids all day Thursday 12-06-16 and follow all of the [place] instructions. Noting by mouth after midnight Thursday!!     Take these medicines the morning of surgery with A SIP OF WATER: famotidine(pepcid) as needed                                You may not have any metal on your body including hair pins and              piercings  Do not wear jewelry, make-up, lotions, powders or perfumes, deodorant             Do not wear nail polish.  Do not shave  48 hours prior to surgery.        Do not bring valuables to the hospital. [place].  Contacts, dentures or bridgework may not be worn into surgery.  Leave suitcase in the car. After surgery it may be brought to your room.               Please read over the following fact sheets you were given: _____________________________________________________________________   CLEAR LIQUID DIET   Foods Allowed                                                                     Foods Excluded  Coffee and tea, regular and decaf                             liquids that you cannot  Plain Jell-O in any flavor                                             see through such as: Fruit ices (not with fruit pulp)                                     milk, soups, orange juice  Iced Popsicles                                    All solid food Carbonated beverages, regular and diet  Cranberry, grape and apple juices Sports  drinks like Gatorade Lightly seasoned clear broth or consume(fat free) Sugar, honey syrup  Sample Menu Breakfast                                Lunch                                     Supper Cranberry juice                    Beef broth                            Chicken broth Jell-O                                     Grape juice                           Apple juice Coffee or tea                        Jell-O                                      Popsicle                                                Coffee or tea                        Coffee or tea  _____________________________________________________________________   COLON BOWEL PREP Please follow the instructions carefully. It is important to clean out your bowels & take the prescribed antibiotic pills to lower your chances of a wound infection or abscess.   FIVE DAYS PRIOR TO YOUR SURGERY Stop eating any nuts, popcorn, or fruit with seeds. Stop all fiber supplements such as Metamucil, Citrucel, etc.   Hold taking any blood thinning anticoagulation medication (ex: aspirin, warfarin/Coumadin, Plavix, Xarelto, Eliquis, Pradaxa, etc) as recommended by your medical/cardiology doctor  Obtain what you need at a pharmacy of your choice: -Filled out prescriptions for your oral antibiotics (Neomycin & Metronidazole)  -A bottle of MiraLax / Glycolax (288g) - no prescription required  -A large bottle of Gatorade / Powerade (64oz)  -Dulcolax tablets (4 tabs) - no prescription required   DAY PRIOR TO SURGERY   7:00am Swallow 4 Dulcolax tablets with some water Drink plenty of clear liquids all day to avoid getting dehydrated (Water, juice, soda, coffee, tea, bouillon, jello, etc.)  10:00am Mix the bottle of MiraLax with the 64-oz bottle of Gatorade.  Drink the Gatorade mixture gradually over the next few hours (8oz glass every 15-30 minutes) until gone. You should finish by 2pm.  2:00pm Take 2 Neomycin 500mg  tablets & 2 Metronidazole  500mg  tablets  3:00pm Take 2 Neomycin 500mg  tablets & 2 Metronidazole 500mg  tablets  Drink plenty of clear liquids all evening to avoid getting dehydrated  10:00pm Take  2 Neomycin 500mg  tablets & 2 Metronidazole 500mg  tablets  Do not eat or drink anything after bedtime (midnight) the night before your surgery.   MORNING OF SURGERY Remember to not to drink or eat anything that morning  Hold or take medications as recommended by the hospital staff at your Preoperative visit  If you have questions or concerns, please call [place] [phone] during business hours to speak to the clinical staff for advice.  [facility] - Preparing for Surgery Before surgery, you can play an important role.  Because skin is not sterile, your skin needs to be as free of germs as possible.  You can reduce the number of germs on your skin by washing with CHG (chlorahexidine gluconate) soap before surgery.  CHG is an antiseptic cleaner which kills germs and bonds with the skin to continue killing germs even after washing. Please DO NOT use if you have an allergy to CHG or antibacterial soaps.  If your skin becomes reddened/irritated stop using the CHG and inform your nurse when you arrive at Short Stay. Do not shave (including legs and underarms) for at least 48 hours prior to the first CHG shower.  You may shave your face/neck. Please follow these instructions carefully:  1.  Shower with CHG Soap the night before surgery and the  morning of Surgery.  2.  If you choose to wash your hair, wash your hair first as usual with your  normal  shampoo.  3.  After you shampoo, rinse your hair and body thoroughly to remove the  shampoo.                           4.  Use CHG as you would any other liquid soap.  You can apply chg directly  to the skin and wash                       Gently with a scrungie or clean washcloth.  5.  Apply the CHG Soap to your body ONLY FROM THE NECK DOWN.   Do not use on face/  open                           Wound or open sores. Avoid contact with eyes, ears mouth and genitals (private parts).                       Wash face,  Genitals (private parts) with your normal soap.             6.  Wash thoroughly, paying special attention to the area where your surgery  will be performed.  7.  Thoroughly rinse your body with warm water from the neck down.  8.  DO NOT shower/wash with your normal soap after using and rinsing off  the CHG Soap.                9.  Pat yourself dry with a clean towel.            10.  Wear clean pajamas.            11.  Place clean sheets on your bed the night of your first shower and do not  sleep with pets. Day of Surgery : Do not apply any lotions/deodorants the morning of surgery.  Please wear clean clothes to the hospital/surgery  center.  FAILURE TO FOLLOW THESE INSTRUCTIONS MAY RESULT IN THE CANCELLATION OF YOUR SURGERY PATIENT SIGNATURE_________________________________  NURSE SIGNATURE__________________________________  ________________________________________________________________________  WHAT IS A BLOOD TRANSFUSION? Blood Transfusion Information  A transfusion is the replacement of blood or some of its parts. Blood is made up of multiple cells which provide different functions.  Red blood cells carry oxygen and are used for blood loss replacement.  White blood cells fight against infection.  Platelets control bleeding.  Plasma helps clot blood.  Other blood products are available for specialized needs, such as hemophilia or other clotting disorders. BEFORE THE TRANSFUSION  Who gives blood for transfusions?   Healthy volunteers who are fully evaluated to make sure their blood is safe. This is blood bank blood. Transfusion therapy is the safest it has ever been in the practice of medicine. Before blood is taken from a donor, a complete history is taken to make sure that person has no history of diseases nor engages in risky  social behavior (examples are intravenous drug use or sexual activity with multiple partners). The donor's travel history is screened to minimize risk of transmitting infections, such as malaria. The donated blood is tested for signs of infectious diseases, such as HIV and hepatitis. The blood is then tested to be sure it is compatible with you in order to minimize the chance of a transfusion reaction. If you or a relative donates blood, this is often done in anticipation of surgery and is not appropriate for emergency situations. It takes many days to process the donated blood. RISKS AND COMPLICATIONS Although transfusion therapy is very safe and saves many lives, the main dangers of transfusion include:   Getting an infectious disease.  Developing a transfusion reaction. This is an allergic reaction to something in the blood you were given. Every precaution is taken to prevent this. The decision to have a blood transfusion has been considered carefully by your caregiver before blood is given. Blood is not given unless the benefits outweigh the risks. AFTER THE TRANSFUSION  Right after receiving a blood transfusion, you will usually feel much better and more energetic. This is especially true if your red blood cells have gotten low (anemic). The transfusion raises the level of the red blood cells which carry oxygen, and this usually causes an energy increase.  The nurse administering the transfusion will monitor you carefully for complications. HOME CARE INSTRUCTIONS  No special instructions are needed after a transfusion. You may find your energy is better. Speak with your caregiver about any limitations on activity for underlying diseases you may have. SEEK MEDICAL CARE IF:   Your condition is not improving after your transfusion.  You develop redness or irritation at the intravenous (IV) site. SEEK IMMEDIATE MEDICAL CARE IF:  Any of the following symptoms occur over the next 12  hours:  Shaking chills.  You have a temperature by mouth above 102 F (38.9 C), not controlled by medicine.  Chest, back, or muscle pain.  People around you feel you are not acting correctly or are confused.  Shortness of breath or difficulty breathing.  Dizziness and fainting.  You get a rash or develop hives.  You have a decrease in urine output.  Your urine turns a dark color or changes to pink, red, or brown. Any of the following symptoms occur over the next 10 days:  You have a temperature by mouth above 102 F (38.9 C), not controlled by medicine.  Shortness of breath.  Weakness after normal  activity.  The white part of the eye turns yellow (jaundice).  You have a decrease in the amount of urine or are urinating less often.  Your urine turns a dark color or changes to pink, red, or brown. Document Released: 06/22/2000 Document Revised: 09/17/2011 Document Reviewed: 02/09/2008 [facility] Patient Information 2014 [place], [place].  _______________________________________________________________________

## 2016-12-04 ENCOUNTER — Encounter (HOSPITAL_COMMUNITY): Payer: Self-pay

## 2016-12-04 ENCOUNTER — Encounter (HOSPITAL_COMMUNITY)
Admission: RE | Admit: 2016-12-04 | Discharge: 2016-12-04 | Disposition: A | Discharge status: Home / Self Care | Payer: 59 | Source: Ambulatory Visit | Attending: Surgery | Admitting: Surgery

## 2016-12-04 LAB — BASIC METABOLIC PANEL
ANION GAP: 8 (ref 5–15)
BUN: 22 mg/dL — AB (ref 6–20)
CHLORIDE: 108 mmol/L (ref 101–111)
CO2: 23 mmol/L (ref 22–32)
Calcium: 9.6 mg/dL (ref 8.9–10.3)
Creatinine, Ser: 0.81 mg/dL (ref 0.44–1.00)
GFR calc Af Amer: >60 mL/min (ref >60)
GFR calc non Af Amer: >60 mL/min (ref >60)
GLUCOSE: 90 mg/dL (ref 65–99)
POTASSIUM: 4.1 mmol/L (ref 3.5–5.1)
Sodium: 139 mmol/L (ref 135–145)

## 2016-12-04 LAB — CBC
HCT: 39.3 % (ref 36.0–46.0)
Hemoglobin: 13.3 g/dL (ref 12.0–15.0)
MCH: 31.6 pg (ref 26.0–34.0)
MCHC: 33.8 g/dL (ref 30.0–36.0)
MCV: 93.3 fL (ref 78.0–100.0)
Platelets: 245 10*3/uL (ref 150–400)
RBC: 4.21 MIL/uL (ref 3.87–5.11)
RDW: 12.6 % (ref 11.5–15.5)
WBC: 8.7 10*3/uL (ref 4.0–10.5)

## 2016-12-04 LAB — ABO/RH: ABO/RH(D): A POS

## 2016-12-05 LAB — HEMOGLOBIN A1C
Hgb A1c MFr Bld: 5.1 % (ref 4.8–5.6)
Mean Plasma Glucose: 100 mg/dL

## 2016-12-06 MED ORDER — SODIUM CHLORIDE 0.9 % IV SOLN
INTRAVENOUS | Status: AC
  Filled 2016-12-06: qty 6

## 2016-12-07 ENCOUNTER — Inpatient Hospital Stay (HOSPITAL_COMMUNITY)
Admission: AD | Admit: 2016-12-07 | Discharge: 2016-12-09 | DRG: 330 | Disposition: A | Discharge status: Home / Self Care | Payer: 59 | Source: Ambulatory Visit | Attending: Surgery | Admitting: Surgery

## 2016-12-07 ENCOUNTER — Ambulatory Visit (HOSPITAL_COMMUNITY): Payer: 59 | Admitting: Anesthesiology

## 2016-12-07 ENCOUNTER — Encounter (HOSPITAL_COMMUNITY)
Admission: AD | Disposition: A | Discharge status: Home / Self Care | Payer: Self-pay | Source: Ambulatory Visit | Attending: Surgery

## 2016-12-07 ENCOUNTER — Encounter (HOSPITAL_COMMUNITY): Payer: Self-pay | Admitting: *Deleted

## 2016-12-07 DIAGNOSIS — I10 Essential (primary) hypertension: Secondary | ICD-10-CM | POA: Diagnosis present

## 2016-12-07 DIAGNOSIS — Z91040 Latex allergy status: Secondary | ICD-10-CM

## 2016-12-07 DIAGNOSIS — K8010 Calculus of gallbladder with chronic cholecystitis without obstruction: Secondary | ICD-10-CM | POA: Diagnosis present

## 2016-12-07 DIAGNOSIS — Z86010 Personal history of colonic polyps: Secondary | ICD-10-CM | POA: Diagnosis not present

## 2016-12-07 DIAGNOSIS — Z90710 Acquired absence of both cervix and uterus: Secondary | ICD-10-CM

## 2016-12-07 DIAGNOSIS — Z79899 Other long term (current) drug therapy: Secondary | ICD-10-CM | POA: Diagnosis not present

## 2016-12-07 DIAGNOSIS — K5732 Diverticulitis of large intestine without perforation or abscess without bleeding: Principal | ICD-10-CM | POA: Diagnosis present

## 2016-12-07 DIAGNOSIS — Z885 Allergy status to narcotic agent status: Secondary | ICD-10-CM | POA: Diagnosis not present

## 2016-12-07 DIAGNOSIS — K219 Gastro-esophageal reflux disease without esophagitis: Secondary | ICD-10-CM | POA: Diagnosis present

## 2016-12-07 DIAGNOSIS — Z6831 Body mass index (BMI) 31.0-31.9, adult: Secondary | ICD-10-CM | POA: Diagnosis not present

## 2016-12-07 DIAGNOSIS — Z888 Allergy status to other drugs, medicaments and biological substances status: Secondary | ICD-10-CM

## 2016-12-07 DIAGNOSIS — Z87891 Personal history of nicotine dependence: Secondary | ICD-10-CM | POA: Diagnosis not present

## 2016-12-07 DIAGNOSIS — E669 Obesity, unspecified: Secondary | ICD-10-CM | POA: Diagnosis present

## 2016-12-07 DIAGNOSIS — Z8249 Family history of ischemic heart disease and other diseases of the circulatory system: Secondary | ICD-10-CM

## 2016-12-07 DIAGNOSIS — K5730 Diverticulosis of large intestine without perforation or abscess without bleeding: Secondary | ICD-10-CM

## 2016-12-07 DIAGNOSIS — K5909 Other constipation: Secondary | ICD-10-CM | POA: Diagnosis present

## 2016-12-07 DIAGNOSIS — Z8679 Personal history of other diseases of the circulatory system: Secondary | ICD-10-CM | POA: Diagnosis present

## 2016-12-07 HISTORY — PX: PROCTOSCOPY: SHX2266

## 2016-12-07 HISTORY — DX: Otalgia, unspecified ear: H92.09

## 2016-12-07 SURGERY — COLECTOMY, PARTIAL, ROBOT-ASSISTED, LAPAROSCOPIC
Anesthesia: General | Site: Abdomen

## 2016-12-07 MED ORDER — SUGAMMADEX SODIUM 200 MG/2ML IV SOLN
INTRAVENOUS | Status: AC
  Filled 2016-12-07: qty 2

## 2016-12-07 MED ORDER — LIDOCAINE 2% (20 MG/ML) 5 ML SYRINGE
INTRAMUSCULAR | Status: AC
  Filled 2016-12-07: qty 5

## 2016-12-07 MED ORDER — ACETAMINOPHEN 500 MG PO TABS
1000.0000 mg | ORAL_TABLET | ORAL | Status: AC
  Administered 2016-12-07: 1000 mg via ORAL
  Filled 2016-12-07: qty 2

## 2016-12-07 MED ORDER — HYDROMORPHONE HCL 1 MG/ML IJ SOLN: 0.5000 mg | INTRAMUSCULAR | Status: DC | PRN

## 2016-12-07 MED ORDER — SODIUM CHLORIDE 0.9 % IV SOLN
INTRAVENOUS | Status: DC
  Administered 2016-12-07: 15:00:00 via INTRAVENOUS

## 2016-12-07 MED ORDER — ALVIMOPAN 12 MG PO CAPS
12.0000 mg | ORAL_CAPSULE | Freq: Two times a day (BID) | ORAL | Status: DC
  Administered 2016-12-08: 10:00:00 12 mg via ORAL
  Filled 2016-12-07: qty 1

## 2016-12-07 MED ORDER — SUGAMMADEX SODIUM 200 MG/2ML IV SOLN
INTRAVENOUS | Status: DC | PRN
  Administered 2016-12-07: 200 mg via INTRAVENOUS

## 2016-12-07 MED ORDER — FENTANYL CITRATE (PF) 250 MCG/5ML IJ SOLN
INTRAMUSCULAR | Status: DC | PRN
  Administered 2016-12-07: 50 ug via INTRAVENOUS
  Administered 2016-12-07: 100 ug via INTRAVENOUS

## 2016-12-07 MED ORDER — 0.9 % SODIUM CHLORIDE (POUR BTL) OPTIME
TOPICAL | Status: DC | PRN
  Administered 2016-12-07: 2000 mL

## 2016-12-07 MED ORDER — SODIUM CHLORIDE 0.9% FLUSH
3.0000 mL | Freq: Two times a day (BID) | INTRAVENOUS | Status: DC
  Administered 2016-12-08: 22:00:00 3 mL via INTRAVENOUS

## 2016-12-07 MED ORDER — SODIUM CHLORIDE 0.9% FLUSH: 3.0000 mL | INTRAVENOUS | Status: DC | PRN

## 2016-12-07 MED ORDER — ADULT MULTIVITAMIN W/MINERALS CH
1.0000 | ORAL_TABLET | Freq: Every day | ORAL | Status: DC
  Administered 2016-12-08 – 2016-12-09 (×2): 1 via ORAL
  Filled 2016-12-07 (×2): qty 1

## 2016-12-07 MED ORDER — FENTANYL CITRATE (PF) 250 MCG/5ML IJ SOLN
INTRAMUSCULAR | Status: AC
  Filled 2016-12-07: qty 5

## 2016-12-07 MED ORDER — DIPHENHYDRAMINE HCL 50 MG/ML IJ SOLN: 12.5000 mg | Freq: Four times a day (QID) | INTRAMUSCULAR | Status: DC | PRN

## 2016-12-07 MED ORDER — DIPHENHYDRAMINE HCL 12.5 MG/5ML PO ELIX: 12.5000 mg | ORAL_SOLUTION | Freq: Four times a day (QID) | ORAL | Status: DC | PRN

## 2016-12-07 MED ORDER — MENTHOL 3 MG MT LOZG: 1.0000 | LOZENGE | OROMUCOSAL | Status: DC | PRN

## 2016-12-07 MED ORDER — LACTATED RINGERS IV SOLN
INTRAVENOUS | Status: DC
  Administered 2016-12-07 (×2): via INTRAVENOUS

## 2016-12-07 MED ORDER — ONDANSETRON HCL 4 MG/2ML IJ SOLN
4.0000 mg | Freq: Four times a day (QID) | INTRAMUSCULAR | Status: DC | PRN
  Administered 2016-12-07: 15:00:00 4 mg via INTRAVENOUS
  Filled 2016-12-07 (×2): qty 2

## 2016-12-07 MED ORDER — ROCURONIUM BROMIDE 50 MG/5ML IV SOSY
PREFILLED_SYRINGE | INTRAVENOUS | Status: DC | PRN
  Administered 2016-12-07: 40 mg via INTRAVENOUS
  Administered 2016-12-07 (×4): 10 mg via INTRAVENOUS

## 2016-12-07 MED ORDER — MEPERIDINE HCL 50 MG/ML IJ SOLN: 6.2500 mg | INTRAMUSCULAR | Status: DC | PRN

## 2016-12-07 MED ORDER — LIP MEDEX EX OINT
1.0000 "application " | TOPICAL_OINTMENT | Freq: Two times a day (BID) | CUTANEOUS | Status: DC
  Administered 2016-12-08 (×2): 1 via TOPICAL

## 2016-12-07 MED ORDER — PROCHLORPERAZINE EDISYLATE 5 MG/ML IJ SOLN
5.0000 mg | INTRAMUSCULAR | Status: DC | PRN
  Administered 2016-12-07: 20:00:00 5 mg via INTRAVENOUS
  Filled 2016-12-07 (×2): qty 2

## 2016-12-07 MED ORDER — ONDANSETRON HCL 4 MG/2ML IJ SOLN
INTRAMUSCULAR | Status: AC
  Filled 2016-12-07: qty 2

## 2016-12-07 MED ORDER — ENOXAPARIN SODIUM 40 MG/0.4ML ~~LOC~~ SOLN
40.0000 mg | SUBCUTANEOUS | Status: DC
  Administered 2016-12-08: 40 mg via SUBCUTANEOUS
  Filled 2016-12-07 (×2): qty 0.4

## 2016-12-07 MED ORDER — IBUPROFEN 400 MG PO TABS: 400.0000 mg | ORAL_TABLET | Freq: Every day | ORAL | Status: DC | PRN

## 2016-12-07 MED ORDER — ONDANSETRON HCL 4 MG PO TABS: 4.0000 mg | ORAL_TABLET | Freq: Four times a day (QID) | ORAL | Status: DC | PRN

## 2016-12-07 MED ORDER — DEXTROSE 5 % IV SOLN
2.0000 g | Freq: Two times a day (BID) | INTRAVENOUS | Status: AC
  Administered 2016-12-07: 2 g via INTRAVENOUS
  Filled 2016-12-07: qty 2

## 2016-12-07 MED ORDER — METOPROLOL TARTRATE 5 MG/5ML IV SOLN: 5.0000 mg | Freq: Four times a day (QID) | INTRAVENOUS | Status: DC | PRN

## 2016-12-07 MED ORDER — BUPIVACAINE LIPOSOME 1.3 % IJ SUSP
INTRAMUSCULAR | Status: DC | PRN
  Administered 2016-12-07: 20 mL

## 2016-12-07 MED ORDER — MAGIC MOUTHWASH
15.0000 mL | Freq: Four times a day (QID) | ORAL | Status: DC | PRN
  Filled 2016-12-07: qty 15

## 2016-12-07 MED ORDER — PROMETHAZINE HCL 25 MG/ML IJ SOLN: 6.2500 mg | INTRAMUSCULAR | Status: DC | PRN

## 2016-12-07 MED ORDER — BISACODYL 5 MG PO TBEC
20.0000 mg | DELAYED_RELEASE_TABLET | Freq: Once | ORAL | Status: DC
  Filled 2016-12-07: qty 4

## 2016-12-07 MED ORDER — RISAQUAD PO CAPS: 1.0000 | ORAL_CAPSULE | Freq: Every day | ORAL | Status: DC

## 2016-12-07 MED ORDER — BUPIVACAINE LIPOSOME 1.3 % IJ SUSP
20.0000 mL | INTRAMUSCULAR | Status: DC
  Filled 2016-12-07: qty 20

## 2016-12-07 MED ORDER — PHENYLEPHRINE HCL 10 MG/ML IJ SOLN
INTRAMUSCULAR | Status: DC | PRN
  Administered 2016-12-07: 40 ug via INTRAVENOUS
  Administered 2016-12-07: 80 ug via INTRAVENOUS

## 2016-12-07 MED ORDER — BUPIVACAINE-EPINEPHRINE (PF) 0.25% -1:200000 IJ SOLN
INTRAMUSCULAR | Status: AC
  Filled 2016-12-07: qty 90

## 2016-12-07 MED ORDER — PROPOFOL 10 MG/ML IV BOLUS
INTRAVENOUS | Status: DC | PRN
  Administered 2016-12-07: 180 mg via INTRAVENOUS

## 2016-12-07 MED ORDER — LACTATED RINGERS IV BOLUS (SEPSIS): 1000.0000 mL | Freq: Three times a day (TID) | INTRAVENOUS | Status: DC | PRN

## 2016-12-07 MED ORDER — PROPOFOL 10 MG/ML IV BOLUS
INTRAVENOUS | Status: AC
  Filled 2016-12-07: qty 20

## 2016-12-07 MED ORDER — ENOXAPARIN SODIUM 40 MG/0.4ML ~~LOC~~ SOLN
40.0000 mg | Freq: Once | SUBCUTANEOUS | Status: AC
  Administered 2016-12-07: 40 mg via SUBCUTANEOUS
  Filled 2016-12-07: qty 0.4

## 2016-12-07 MED ORDER — HYDROCORTISONE 2.5 % RE CREA: 1.0000 "application " | TOPICAL_CREAM | Freq: Four times a day (QID) | RECTAL | Status: DC | PRN

## 2016-12-07 MED ORDER — PHENOL 1.4 % MT LIQD: 1.0000 | OROMUCOSAL | Status: DC | PRN

## 2016-12-07 MED ORDER — ACETAMINOPHEN 500 MG PO TABS
1000.0000 mg | ORAL_TABLET | Freq: Three times a day (TID) | ORAL | Status: DC
  Administered 2016-12-07 – 2016-12-09 (×6): 1000 mg via ORAL
  Filled 2016-12-07 (×6): qty 2

## 2016-12-07 MED ORDER — DEXAMETHASONE SODIUM PHOSPHATE 10 MG/ML IJ SOLN
INTRAMUSCULAR | Status: DC | PRN
  Administered 2016-12-07: 10 mg via INTRAVENOUS

## 2016-12-07 MED ORDER — MIDAZOLAM HCL 2 MG/2ML IJ SOLN
INTRAMUSCULAR | Status: AC
  Filled 2016-12-07: qty 2

## 2016-12-07 MED ORDER — HYDROMORPHONE HCL 2 MG/ML IJ SOLN
INTRAMUSCULAR | Status: AC
  Filled 2016-12-07: qty 1

## 2016-12-07 MED ORDER — TRAMADOL HCL 50 MG PO TABS: 50.0000 mg | ORAL_TABLET | Freq: Four times a day (QID) | ORAL | 0 refills | Status: DC | PRN

## 2016-12-07 MED ORDER — HYDROMORPHONE HCL 1 MG/ML IJ SOLN
0.2500 mg | INTRAMUSCULAR | Status: DC | PRN
  Administered 2016-12-07 (×3): 0.5 mg via INTRAVENOUS

## 2016-12-07 MED ORDER — KETOROLAC TROMETHAMINE 30 MG/ML IJ SOLN: 30.0000 mg | Freq: Once | INTRAMUSCULAR | Status: DC | PRN

## 2016-12-07 MED ORDER — SUCCINYLCHOLINE CHLORIDE 200 MG/10ML IV SOSY
PREFILLED_SYRINGE | INTRAVENOUS | Status: DC | PRN
  Administered 2016-12-07: 120 mg via INTRAVENOUS

## 2016-12-07 MED ORDER — ALVIMOPAN 12 MG PO CAPS
12.0000 mg | ORAL_CAPSULE | Freq: Once | ORAL | Status: AC
  Administered 2016-12-07: 12 mg via ORAL
  Filled 2016-12-07: qty 1

## 2016-12-07 MED ORDER — ONDANSETRON HCL 4 MG/2ML IJ SOLN
INTRAMUSCULAR | Status: DC | PRN
  Administered 2016-12-07: 4 mg via INTRAVENOUS

## 2016-12-07 MED ORDER — LACTATED RINGERS IR SOLN
Status: DC | PRN
  Administered 2016-12-07: 1000 mL

## 2016-12-07 MED ORDER — DEXAMETHASONE SODIUM PHOSPHATE 10 MG/ML IJ SOLN
INTRAMUSCULAR | Status: AC
  Filled 2016-12-07: qty 1

## 2016-12-07 MED ORDER — METHOCARBAMOL 1000 MG/10ML IJ SOLN
1000.0000 mg | Freq: Four times a day (QID) | INTRAVENOUS | Status: DC | PRN
  Filled 2016-12-07: qty 10

## 2016-12-07 MED ORDER — METOPROLOL TARTRATE 12.5 MG HALF TABLET: 12.5000 mg | ORAL_TABLET | Freq: Two times a day (BID) | ORAL | Status: DC | PRN

## 2016-12-07 MED ORDER — GABAPENTIN 300 MG PO CAPS
300.0000 mg | ORAL_CAPSULE | Freq: Every day | ORAL | Status: DC
  Administered 2016-12-07 – 2016-12-08 (×2): 300 mg via ORAL
  Filled 2016-12-07 (×2): qty 1

## 2016-12-07 MED ORDER — SACCHAROMYCES BOULARDII 250 MG PO CAPS
250.0000 mg | ORAL_CAPSULE | Freq: Two times a day (BID) | ORAL | Status: DC
  Administered 2016-12-07 – 2016-12-09 (×4): 250 mg via ORAL
  Filled 2016-12-07 (×4): qty 1

## 2016-12-07 MED ORDER — METHOCARBAMOL 500 MG PO TABS: 1000.0000 mg | ORAL_TABLET | Freq: Four times a day (QID) | ORAL | Status: DC | PRN

## 2016-12-07 MED ORDER — CEFOTETAN DISODIUM-DEXTROSE 2-2.08 GM-% IV SOLR
INTRAVENOUS | Status: AC
  Administered 2016-12-07: 20:00:00 2 g
  Filled 2016-12-07: qty 50

## 2016-12-07 MED ORDER — CELECOXIB 200 MG PO CAPS
400.0000 mg | ORAL_CAPSULE | ORAL | Status: AC
  Administered 2016-12-07: 400 mg via ORAL
  Filled 2016-12-07: qty 2

## 2016-12-07 MED ORDER — FAMOTIDINE 20 MG PO TABS: 20.0000 mg | ORAL_TABLET | Freq: Every day | ORAL | Status: DC | PRN

## 2016-12-07 MED ORDER — ALUM & MAG HYDROXIDE-SIMETH 200-200-20 MG/5ML PO SUSP: 30.0000 mL | Freq: Four times a day (QID) | ORAL | Status: DC | PRN

## 2016-12-07 MED ORDER — PHENYLEPHRINE 40 MCG/ML (10ML) SYRINGE FOR IV PUSH (FOR BLOOD PRESSURE SUPPORT)
PREFILLED_SYRINGE | INTRAVENOUS | Status: AC
  Filled 2016-12-07: qty 10

## 2016-12-07 MED ORDER — GABAPENTIN 300 MG PO CAPS
300.0000 mg | ORAL_CAPSULE | ORAL | Status: AC
  Administered 2016-12-07: 300 mg via ORAL
  Filled 2016-12-07: qty 1

## 2016-12-07 MED ORDER — SODIUM CHLORIDE 0.9 % IV SOLN: 250.0000 mL | INTRAVENOUS | Status: DC | PRN

## 2016-12-07 MED ORDER — SUCCINYLCHOLINE CHLORIDE 200 MG/10ML IV SOSY
PREFILLED_SYRINGE | INTRAVENOUS | Status: AC
  Filled 2016-12-07: qty 10

## 2016-12-07 MED ORDER — LIDOCAINE 2% (20 MG/ML) 5 ML SYRINGE
INTRAMUSCULAR | Status: DC | PRN
  Administered 2016-12-07: 60 mg via INTRAVENOUS

## 2016-12-07 MED ORDER — ROCURONIUM BROMIDE 50 MG/5ML IV SOSY
PREFILLED_SYRINGE | INTRAVENOUS | Status: AC
  Filled 2016-12-07: qty 5

## 2016-12-07 MED ORDER — GUAIFENESIN-DM 100-10 MG/5ML PO SYRP: 10.0000 mL | ORAL_SOLUTION | ORAL | Status: DC | PRN

## 2016-12-07 MED ORDER — SODIUM CHLORIDE 0.9 % IV SOLN
INTRAVENOUS | Status: DC | PRN
  Administered 2016-12-07: 1000 mL via INTRAPERITONEAL

## 2016-12-07 MED ORDER — TRAMADOL HCL 50 MG PO TABS: 50.0000 mg | ORAL_TABLET | Freq: Four times a day (QID) | ORAL | Status: DC | PRN

## 2016-12-07 MED ORDER — MIDAZOLAM HCL 2 MG/2ML IJ SOLN
INTRAMUSCULAR | Status: DC | PRN
  Administered 2016-12-07: 2 mg via INTRAVENOUS

## 2016-12-07 MED ORDER — BUPIVACAINE-EPINEPHRINE (PF) 0.25% -1:200000 IJ SOLN
INTRAMUSCULAR | Status: DC | PRN
  Administered 2016-12-07: 60 mL via PERINEURAL

## 2016-12-07 MED ORDER — LACTATED RINGERS IV SOLN: 1000.0000 mL | Freq: Three times a day (TID) | INTRAVENOUS | Status: DC | PRN

## 2016-12-07 MED ORDER — EPHEDRINE 5 MG/ML INJ
INTRAVENOUS | Status: AC
  Filled 2016-12-07: qty 10

## 2016-12-07 MED ORDER — HYDROCORTISONE 1 % EX CREA: 1.0000 "application " | TOPICAL_CREAM | Freq: Three times a day (TID) | CUTANEOUS | Status: DC | PRN

## 2016-12-07 SURGICAL SUPPLY — 104 items
APPLIER CLIP 5 13 M/L LIGAMAX5 (MISCELLANEOUS)
APPLIER CLIP ROT 10 11.4 M/L (STAPLE)
APR CLP MED LRG 11.4X10 (STAPLE)
APR CLP MED LRG 5 ANG JAW (MISCELLANEOUS)
BLADE EXTENDED COATED 6.5IN (ELECTRODE) ×2 IMPLANT
CANNULA REDUC XI 12-8 STAPL (CANNULA) ×1
CANNULA REDUCER 12-8 DVNC XI (CANNULA) ×2 IMPLANT
CELLS DAT CNTRL 66122 CELL SVR (MISCELLANEOUS) IMPLANT
CHLORAPREP W/TINT 26ML (MISCELLANEOUS) ×2 IMPLANT
CLIP APPLIE 5 13 M/L LIGAMAX5 (MISCELLANEOUS) IMPLANT
CLIP APPLIE ROT 10 11.4 M/L (STAPLE) IMPLANT
CLIP LIGATING HEM O LOK PURPLE (MISCELLANEOUS) IMPLANT
CLIP LIGATING HEMO O LOK GREEN (MISCELLANEOUS) IMPLANT
COUNTER NEEDLE 20 DBL MAG RED (NEEDLE) IMPLANT
COVER SURGICAL LIGHT HANDLE (MISCELLANEOUS) ×4 IMPLANT
COVER TIP SHEARS 8 DVNC (MISCELLANEOUS) ×2 IMPLANT
COVER TIP SHEARS 8MM DA VINCI (MISCELLANEOUS) ×1
DECANTER SPIKE VIAL GLASS SM (MISCELLANEOUS) ×3 IMPLANT
DEVICE TROCAR PUNCTURE CLOSURE (ENDOMECHANICALS) ×3 IMPLANT
DRAIN CHANNEL 19F RND (DRAIN) ×3 IMPLANT
DRAPE ARM DVNC X/XI (DISPOSABLE) ×6 IMPLANT
DRAPE COLUMN DVNC XI (DISPOSABLE) ×2 IMPLANT
DRAPE DA VINCI XI ARM (DISPOSABLE) ×4
DRAPE DA VINCI XI COLUMN (DISPOSABLE) ×1
DRAPE SURG IRRIG POUCH 19X23 (DRAPES) ×3 IMPLANT
DRSG OPSITE POSTOP 4X10 (GAUZE/BANDAGES/DRESSINGS) IMPLANT
DRSG OPSITE POSTOP 4X6 (GAUZE/BANDAGES/DRESSINGS) ×1 IMPLANT
DRSG OPSITE POSTOP 4X8 (GAUZE/BANDAGES/DRESSINGS) IMPLANT
DRSG TEGADERM 2-3/8X2-3/4 SM (GAUZE/BANDAGES/DRESSINGS) ×3 IMPLANT
DRSG TEGADERM 4X4.75 (GAUZE/BANDAGES/DRESSINGS) ×3 IMPLANT
ELECT PENCIL ROCKER SW 15FT (MISCELLANEOUS) IMPLANT
ELECT REM PT RETURN 15FT ADLT (MISCELLANEOUS) ×3 IMPLANT
ENDOLOOP SUT PDS II  0 18 (SUTURE)
ENDOLOOP SUT PDS II 0 18 (SUTURE) IMPLANT
EVACUATOR SILICONE 100CC (DRAIN) ×3 IMPLANT
GAUZE SPONGE 2X2 8PLY STRL LF (GAUZE/BANDAGES/DRESSINGS) ×2 IMPLANT
GAUZE SPONGE 4X4 12PLY STRL (GAUZE/BANDAGES/DRESSINGS) IMPLANT
GLOVE ECLIPSE 8.0 STRL XLNG CF (GLOVE) ×10 IMPLANT
GLOVE INDICATOR 8.0 STRL GRN (GLOVE) ×10 IMPLANT
GOWN STRL REUS W/TWL XL LVL3 (GOWN DISPOSABLE) ×19 IMPLANT
GRASPER ENDOPATH ANVIL 10MM (MISCELLANEOUS) IMPLANT
HOLDER FOLEY CATH W/STRAP (MISCELLANEOUS) ×3 IMPLANT
IRRIG SUCT STRYKERFLOW 2 WTIP (MISCELLANEOUS) ×3
IRRIGATION SUCT STRKRFLW 2 WTP (MISCELLANEOUS) ×2 IMPLANT
KIT PROCEDURE DA VINCI SI (MISCELLANEOUS)
KIT PROCEDURE DVNC SI (MISCELLANEOUS) ×2 IMPLANT
LEGGING LITHOTOMY PAIR STRL (DRAPES) ×3 IMPLANT
LUBRICANT JELLY K Y 4OZ (MISCELLANEOUS) ×3 IMPLANT
NDL INSUFFLATION 14GA 120MM (NEEDLE) ×2 IMPLANT
NEEDLE INSUFFLATION 14GA 120MM (NEEDLE) ×3 IMPLANT
PACK CARDIOVASCULAR III (CUSTOM PROCEDURE TRAY) ×3 IMPLANT
PACK COLON (CUSTOM PROCEDURE TRAY) ×3 IMPLANT
PAD POSITIONING PINK XL (MISCELLANEOUS) ×3 IMPLANT
PORT LAP GEL ALEXIS MED 5-9CM (MISCELLANEOUS) ×3 IMPLANT
RELOAD STAPLE 45 BLU REG DVNC (STAPLE) IMPLANT
RELOAD STAPLE 45 GRN THCK DVNC (STAPLE) IMPLANT
RETRACTOR WND ALEXIS 18 MED (MISCELLANEOUS) IMPLANT
RTRCTR WOUND ALEXIS 18CM MED (MISCELLANEOUS)
SCISSORS LAP 5X35 DISP (ENDOMECHANICALS) ×3 IMPLANT
SEAL CANN UNIV 5-8 DVNC XI (MISCELLANEOUS) ×6 IMPLANT
SEAL XI 5MM-8MM UNIVERSAL (MISCELLANEOUS) ×3
SEALER VESSEL DA VINCI XI (MISCELLANEOUS) ×1
SEALER VESSEL EXT DVNC XI (MISCELLANEOUS) ×2 IMPLANT
SLEEVE ADV FIXATION 5X100MM (TROCAR) ×3 IMPLANT
SOLUTION ELECTROLUBE (MISCELLANEOUS) ×3 IMPLANT
SPONGE GAUZE 2X2 STER 10/PKG (GAUZE/BANDAGES/DRESSINGS) ×1
STAPLER 45 BLU RELOAD XI (STAPLE) ×4 IMPLANT
STAPLER 45 BLUE RELOAD XI (STAPLE) ×2
STAPLER 45 GREEN RELOAD XI (STAPLE)
STAPLER 45 GRN RELOAD XI (STAPLE) IMPLANT
STAPLER CANNULA SEAL DVNC XI (STAPLE) ×2 IMPLANT
STAPLER CANNULA SEAL XI (STAPLE) ×1
STAPLER SHEATH (SHEATH) ×1
STAPLER SHEATH ENDOWRIST DVNC (SHEATH) ×2 IMPLANT
SUT MNCRL AB 4-0 PS2 18 (SUTURE) ×3 IMPLANT
SUT PDS AB 1 CT1 27 (SUTURE) ×2 IMPLANT
SUT PDS AB 1 CTX 36 (SUTURE) IMPLANT
SUT PDS AB 1 TP1 96 (SUTURE) IMPLANT
SUT PDS AB 2-0 CT2 27 (SUTURE) IMPLANT
SUT PROLENE 0 CT 2 (SUTURE) ×3 IMPLANT
SUT PROLENE 2 0 KS (SUTURE) IMPLANT
SUT PROLENE 2 0 SH DA (SUTURE) IMPLANT
SUT SILK 2 0 (SUTURE) ×3
SUT SILK 2 0 SH CR/8 (SUTURE) ×3 IMPLANT
SUT SILK 2-0 18XBRD TIE 12 (SUTURE) ×2 IMPLANT
SUT SILK 3 0 (SUTURE) ×3
SUT SILK 3 0 SH CR/8 (SUTURE) ×3 IMPLANT
SUT SILK 3-0 18XBRD TIE 12 (SUTURE) ×2 IMPLANT
SUT V-LOC BARB 180 2/0GR6 GS22 (SUTURE)
SUT VIC AB 3-0 SH 18 (SUTURE) ×3 IMPLANT
SUT VIC AB 3-0 SH 27 (SUTURE) ×3
SUT VIC AB 3-0 SH 27XBRD (SUTURE) ×2 IMPLANT
SUT VICRYL 0 UR6 27IN ABS (SUTURE) ×4 IMPLANT
SUTURE V-LC BRB 180 2/0GR6GS22 (SUTURE) IMPLANT
SYR 10ML LL (SYRINGE) ×3 IMPLANT
SYS LAPSCP GELPORT 120MM (MISCELLANEOUS)
SYSTEM LAPSCP GELPORT 120MM (MISCELLANEOUS) IMPLANT
TAPE UMBILICAL COTTON 1/8X30 (MISCELLANEOUS) ×3 IMPLANT
TOWEL OR 17X26 10 PK STRL BLUE (TOWEL DISPOSABLE) IMPLANT
TOWEL OR NON WOVEN STRL DISP B (DISPOSABLE) ×3 IMPLANT
TRAY FOLEY W/METER SILVER 16FR (SET/KITS/TRAYS/PACK) ×3 IMPLANT
TROCAR ADV FIXATION 5X100MM (TROCAR) ×3 IMPLANT
TUBING CONNECTING 10 (TUBING) IMPLANT
TUBING INSUFFLATION 10FT LAP (TUBING) ×3 IMPLANT

## 2016-12-07 NOTE — Anesthesia Postprocedure Evaluation (Signed)
Anesthesia Post Note  Patient: [name]  Procedure(s) Performed: Procedure(s) (LRB): XI ROBOT DISTAL SIGMOID  COLECTOMY ERAS PATHWAY (N/A) RIGID PROCTOSCOPY (N/A)     Patient location during evaluation: PACU Anesthesia Type: General Level of consciousness: sedated and patient cooperative Pain management: pain level controlled Vital Signs Assessment: post-procedure vital signs reviewed and stable Respiratory status: spontaneous breathing Cardiovascular status: stable Anesthetic complications: no    Last Vitals:  Vitals:   12/07/16 1430 12/07/16 1533  BP: 108/70 116/76  Pulse: (!) 57 (!) 56  Resp: 12 12  Temp: 36.4 C 36.4 C    Last Pain:  Vitals:   12/07/16 1533  TempSrc: Oral  PainSc:                  Nolon Nations

## 2016-12-07 NOTE — Discharge Instructions (Signed)
SURGERY: POST OP INSTRUCTIONS °(Surgery for small bowel obstruction, colon resection, etc) ° ° °###################################################################### ° °EAT °Gradually transition to a high fiber diet with a fiber supplement over the next few days after discharge ° °WALK °Walk an hour a day.  Control your pain to do that.   ° °CONTROL PAIN °Control pain so that you can walk, sleep, tolerate sneezing/coughing, go up/down stairs. ° °HAVE A BOWEL MOVEMENT DAILY °Keep your bowels regular to avoid problems.  OK to try a laxative to override constipation.  OK to use an antidairrheal to slow down diarrhea.  Call if not better after 2 tries ° °CALL IF YOU HAVE PROBLEMS/CONCERNS °Call if you are still struggling despite following these instructions. °Call if you have concerns not answered by these instructions ° °###################################################################### ° ° °DIET °Follow a light diet the first few days at home.  Start with a bland diet such as soups, liquids, starchy foods, low fat foods, etc.  If you feel full, bloated, or constipated, stay on a ful liquid or pureed/blenderized diet for a few days until you feel better and no longer constipated. °Be sure to drink plenty of fluids every day to avoid getting dehydrated (feeling dizzy, not urinating, etc.). °Gradually add a fiber supplement to your diet over the next week.  Gradually get back to a regular solid diet.  Avoid fast food or heavy meals the first week as you are more likely to get nauseated. °It is expected for your digestive tract to need a few months to get back to normal.  It is common for your bowel movements and stools to be irregular.  You will have occasional bloating and cramping that should eventually fade away.  Until you are eating solid food normally, off all pain medications, and back to regular activities; your bowels will not be normal. °Focus on eating a low-fat, high fiber diet the rest of your life  (See Getting to Good Bowel Health, below). ° °CARE of your INCISION or WOUND °It is good for closed incision and even open wounds to be washed every day.  Shower every day.  Short baths are fine.  Wash the incisions and wounds clean with soap & water.    °If you have a closed incision(s), wash the incision with soap & water every day.  You may leave closed incisions open to air if it is dry.   You may cover the incision with clean gauze & replace it after your daily shower for comfort. °If you have skin tapes (Steristrips) or skin glue (Dermabond) on your incision, leave them in place.  They will fall off on their own like a scab.  You may trim any edges that curl up with clean scissors.  If you have staples, set up an appointment for them to be removed in the office in 10 days after surgery.  °If you have a drain, wash around the skin exit site with soap & water and place a new dressing of gauze or band aid around the skin every day.  Keep the drain site clean & dry.    °If you have an open wound with packing, see wound care instructions.  In general, it is encouraged that you remove your dressing and packing, shower with soap & water, and replace your dressing once a day.  Pack the wound with clean gauze moistened with normal (0.9%) saline to keep the wound moist & uninfected.  Pressure on the dressing for 30 minutes will stop most wound   bleeding.  Eventually your body will heal & pull the open wound closed over the next few months.  °Raw open wounds will occasionally bleed or secrete yellow drainage until it heals closed.  Drain sites will drain a little until the drain is removed.  Even closed incisions can have mild bleeding or drainage the first few days until the skin edges scab over & seal.   °If you have an open wound with a wound vac, see wound vac care instructions. ° ° ° ° °ACTIVITIES as tolerated °Start light daily activities --- self-care, walking, climbing stairs-- beginning the day after surgery.   Gradually increase activities as tolerated.  Control your pain to be active.  Stop when you are tired.  Ideally, walk several times a day, eventually an hour a day.   °Most people are back to most day-to-day activities in a few weeks.  It takes 4-8 weeks to get back to unrestricted, intense activity. °If you can walk 30 minutes without difficulty, it is safe to try more intense activity such as jogging, treadmill, bicycling, low-impact aerobics, swimming, etc. °Save the most intensive and strenuous activity for last (Usually 4-8 weeks after surgery) such as sit-ups, heavy lifting, contact sports, etc.  Refrain from any intense heavy lifting or straining until you are off narcotics for pain control.  You will have off days, but things should improve week-by-week. °DO NOT PUSH THROUGH PAIN.  Let pain be your guide: If it hurts to do something, don't do it.  Pain is your body warning you to avoid that activity for another week until the pain goes down. °You may drive when you are no longer taking narcotic prescription pain medication, you can comfortably wear a seatbelt, and you can safely make sudden turns/stops to protect yourself without hesitating due to pain. °You may have sexual intercourse when it is comfortable. If it hurts to do something, stop. ° °MEDICATIONS °Take your usually prescribed home medications unless otherwise directed.   °Blood thinners:  °Usually you can restart any strong blood thinners after the second postoperative day.  It is OK to take aspirin right away.    ° If you are on strong blood thinners (warfarin/Coumadin, Plavix, Xerelto, Eliquis, Pradaxa, etc), discuss with your surgeon, medicine PCP, and/or cardiologist for instructions on when to restart the blood thinner & if blood monitoring is needed (PT/INR blood check, etc).   ° ° °PAIN CONTROL °Pain after surgery or related to activity is often due to strain/injury to muscle, tendon, nerves and/or incisions.  This pain is usually  short-term and will improve in a few months.  °To help speed the process of healing and to get back to regular activity more quickly, DO THE FOLLOWING THINGS TOGETHER: °1. Increase activity gradually.  DO NOT PUSH THROUGH PAIN °2. Use Ice and/or Heat °3. Try Gentle Massage and/or Stretching °4. Take over the counter pain medication °5. Take Narcotic prescription pain medication for more severe pain ° °Good pain control = faster recovery.  It is better to take more medicine to be more active than to stay in bed all day to avoid medications. °1.  Increase activity gradually °Avoid heavy lifting at first, then increase to lifting as tolerated over the next 6 weeks. °Do not “push through” the pain.  Listen to your body and avoid positions and maneuvers than reproduce the pain.  Wait a few days before trying something more intense °Walking an hour a day is encouraged to help your body recover faster   and more safely.  Start slowly and stop when getting sore.  If you can walk 30 minutes without stopping or pain, you can try more intense activity (running, jogging, aerobics, cycling, swimming, treadmill, sex, sports, weightlifting, etc.) °Remember: If it hurts to do it, then don’t do it! °2. Use Ice and/or Heat °You will have swelling and bruising around the incisions.  This will take several weeks to resolve. °Ice packs or heating pads (6-8 times a day, 30-60 minutes at a time) will help sooth soreness & bruising. °Some people prefer to use ice alone, heat alone, or alternate between ice & heat.  Experiment and see what works best for you.  Consider trying ice for the first few days to help decrease swelling and bruising; then, switch to heat to help relax sore spots and speed recovery. °Shower every day.  Short baths are fine.  It feels good!  Keep the incisions and wounds clean with soap & water.   °3. Try Gentle Massage and/or Stretching °Massage at the area of pain many times a day °Stop if you feel pain - do not  overdo it °4. Take over the counter pain medication °This helps the muscle and nerve tissues become less irritable and calm down faster °Choose ONE of the following over-the-counter anti-inflammatory medications: °Acetaminophen 500mg tabs (Tylenol) 1-2 pills with every meal and just before bedtime (avoid if you have liver problems or if you have acetaminophen in you narcotic prescription) °Naproxen 220mg tabs (ex. Aleve, Naprosyn) 1-2 pills twice a day (avoid if you have kidney, stomach, IBD, or bleeding problems) °Ibuprofen 200mg tabs (ex. Advil, Motrin) 3-4 pills with every meal and just before bedtime (avoid if you have kidney, stomach, IBD, or bleeding problems) °Take with food/snack several times a day as directed for at least 2 weeks to help keep pain / soreness down & more manageable. °5. Take Narcotic prescription pain medication for more severe pain °A prescription for strong pain control is often given to you upon discharge (for example: oxycodone/Percocet, hydrocodone/Norco/Vicodin, or tramadol/Ultram) °Take your pain medication as prescribed. °Be mindful that most narcotic prescriptions contain Tylenol (acetaminophen) as well - avoid taking too much Tylenol. °If you are having problems/concerns with the prescription medicine (does not control pain, nausea, vomiting, rash, itching, etc.), please call us [phone] to see if we need to switch you to a different pain medicine that will work better for you and/or control your side effects better. °If you need a refill on your pain medication, you must call the office before 4 pm and on weekdays only.  By federal law, prescriptions for narcotics cannot be called into a pharmacy.  They must be filled out on paper & picked up from our office by the patient or authorized caretaker.  Prescriptions cannot be filled after 4 pm nor on weekends.   ° °WHEN TO CALL US [phone] °Severe uncontrolled or worsening pain  °Fever over 101 F (38.5 C) °Concerns with  the incision: Worsening pain, redness, rash/hives, swelling, bleeding, or drainage °Reactions / problems with new medications (itching, rash, hives, nausea, etc.) °Nausea and/or vomiting °Difficulty urinating °Difficulty breathing °Worsening fatigue, dizziness, lightheadedness, blurred vision °Other concerns °If you are not getting better after two weeks or are noticing you are getting worse, contact our office [phone] for further advice.  We may need to adjust your medications, re-evaluate you in the office, send you to the emergency room, or see what other things we can do to help. °The   clinic staff is available to answer your questions during regular business hours (8:30am-5pm).  Please don’t hesitate to call and ask to speak to one of our nurses for clinical concerns.    °A surgeon from [facility] is always on call at the hospitals 24 hours/day °If you have a medical emergency, go to the nearest emergency room or call 911. ° °FOLLOW UP in our office °One the day of your discharge from the hospital (or the next business weekday), please call [facility] to set up or confirm an appointment to see your surgeon in the office for a follow-up appointment.  Usually it is 2-3 weeks after your surgery.   °If you have skin staples at your incision(s), let the office know so we can set up a time in the office for the nurse to remove them (usually around 10 days after surgery). °Make sure that you call for appointments the day of discharge (or the next business weekday) from the hospital to ensure a convenient appointment time. °IF YOU HAVE DISABILITY OR FAMILY LEAVE FORMS, BRING THEM TO THE OFFICE FOR PROCESSING.  DO NOT GIVE THEM TO YOUR DOCTOR. ° °[facility] °[address], [address], [place], [place]  [address] ? °[phone] - Main °[phone] - Toll Free,  [phone] - Fax °[email or web address] ° °GETTING TO GOOD BOWEL HEALTH. °It is  expected for your digestive tract to need a few months to get back to normal.  It is common for your bowel movements and stools to be irregular.  You will have occasional bloating and cramping that should eventually fade away.  Until you are eating solid food normally, off all pain medications, and back to regular activities; your bowels will not be normal.   °Avoiding constipation °The goal: ONE SOFT BOWEL MOVEMENT A DAY!    °Drink plenty of fluids.  Choose water first. °TAKE A FIBER SUPPLEMENT EVERY DAY THE REST OF YOUR LIFE °During your first week back home, gradually add back a fiber supplement every day °Experiment which form you can tolerate.   There are many forms such as powders, tablets, wafers, gummies, etc °Psyllium bran (Metamucil), methylcellulose (Citrucel), Miralax or Glycolax, Benefiber, Flax Seed.  °Adjust the dose week-by-week (1/2 dose/day to 6 doses a day) until you are moving your bowels 1-2 times a day.  Cut back the dose or try a different fiber product if it is giving you problems such as diarrhea or bloating. °Sometimes a laxative is needed to help jump-start bowels if constipated until the fiber supplement can help regulate your bowels.  If you are tolerating eating & you are farting, it is okay to try a gentle laxative such as double dose MiraLax, prune juice, or Milk of Magnesia.  Avoid using laxatives too often. °Stool softeners can sometimes help counteract the constipating effects of narcotic pain medicines.  It can also cause diarrhea, so avoid using for too long. °If you are still constipated despite taking fiber daily, eating solids, and a few doses of laxatives, call our office. °Controlling diarrhea °Try drinking liquids and eating bland foods for a few days to avoid stressing your intestines further. °Avoid dairy products (especially milk & ice cream) for a short time.  The intestines often can lose the ability to digest lactose when stressed. °Avoid foods that cause gassiness or  bloating.  Typical foods include beans and other legumes, cabbage, broccoli, and dairy foods.  Avoid greasy, spicy, fast foods.  Every person has   some sensitivity to other foods, so listen to your body and avoid those foods that trigger problems for you. °Probiotics (such as active yogurt, Align, etc) may help repopulate the intestines and colon with normal bacteria and calm down a sensitive digestive tract °Adding a fiber supplement gradually can help thicken stools by absorbing excess fluid and retrain the intestines to act more normally.  Slowly increase the dose over a few weeks.  Too much fiber too soon can backfire and cause cramping & bloating. °It is okay to try and slow down diarrhea with a few doses of antidiarrheal medicines.   °Bismuth subsalicylate (ex. Kayopectate, Pepto Bismol) for a few doses can help control diarrhea.  Avoid if pregnant.   °Loperamide (Imodium) can slow down diarrhea.  Start with one tablet (2mg) first.  Avoid if you are having fevers or severe pain.  °ILEOSTOMY PATIENTS WILL HAVE CHRONIC DIARRHEA since their colon is not in use.    °Drink plenty of liquids.  You will need to drink even more glasses of water/liquid a day to avoid getting dehydrated. °Record output from your ileostomy.  Expect to empty the bag every 3-4 hours at first.  Most people with a permanent ileostomy empty their bag 4-6 times at the least.   °Use antidiarrheal medicine (especially Imodium) several times a day to avoid getting dehydrated.  Start with a dose at bedtime & breakfast.  Adjust up or down as needed.  Increase antidiarrheal medications as directed to avoid emptying the bag more than 8 times a day (every 3 hours). °Work with your wound ostomy nurse to learn care for your ostomy.  See ostomy care instructions. °TROUBLESHOOTING IRREGULAR BOWELS °1) Start with a soft & bland diet. No spicy, greasy, or fried foods.  °2) Avoid gluten/wheat or dairy products from diet to see if symptoms improve. °3) Miralax  17gm or flax seed mixed in 8oz. water or juice-daily. May use 2-4 times a day as needed. °4) Gas-X, Phazyme, etc. as needed for gas & bloating.  °5) Prilosec (omeprazole) over-the-counter as needed °6)  Consider probiotics (Align, Activa, etc) to help calm the bowels down ° °Call your doctor if you are getting worse or not getting better.  Sometimes further testing (cultures, endoscopy, X-ray studies, CT scans, bloodwork, etc.) may be needed to help diagnose and treat the cause of the diarrhea. °[facility] °[address], [address], [place], [place]  [address] °[phone] - Main.    °[phone]  - Toll Free.   [phone] - Fax °[email or web address] ° ° °Diverticulitis °Diverticulitis is inflammation or infection of small pouches in your colon that form when you have a condition called diverticulosis. The pouches in your colon are called diverticula. Your colon, or large intestine, is where water is absorbed and stool is formed. °Complications of diverticulitis can include: °· Bleeding. °· Severe infection. °· Severe pain. °· Perforation of your colon. °· Obstruction of your colon. °What are the causes? °Diverticulitis is caused by bacteria. °Diverticulitis happens when stool becomes trapped in diverticula. This allows bacteria to grow in the diverticula, which can lead to inflammation and infection. °What increases the risk? °People with diverticulosis are at risk for diverticulitis. Eating a diet that does not include enough fiber from fruits and vegetables may make diverticulitis more likely to develop. °What are the signs or symptoms? °Symptoms of diverticulitis may include: °· Abdominal pain and tenderness. The pain is normally located on the left side of the abdomen, but may occur   in other areas. °· Fever and chills. °· Bloating. °· Cramping. °· Nausea. °· Vomiting. °· Constipation. °· Diarrhea. °· Blood in your stool. °How is this diagnosed? °Your health care  provider will ask you about your medical history and do a physical exam. You may need to have tests done because many medical conditions can cause the same symptoms as diverticulitis. Tests may include: °· Blood tests. °· Urine tests. °· Imaging tests of the abdomen, including X-rays and CT scans. °When your condition is under control, your health care provider may recommend that you have a colonoscopy. A colonoscopy can show how severe your diverticula are and whether something else is causing your symptoms. °How is this treated? °Most cases of diverticulitis are mild and can be treated at home. Treatment may include: °· Taking over-the-counter pain medicines. °· Following a clear liquid diet. °· Taking antibiotic medicines by mouth for 7-10 days. °More severe cases may be treated at a hospital. Treatment may include: °· Not eating or drinking. °· Taking prescription pain medicine. °· Receiving antibiotic medicines through an IV tube. °· Receiving fluids and nutrition through an IV tube. °· Surgery. °Follow these instructions at home: °· Follow your health care provider’s instructions carefully. °· Follow a full liquid diet or other diet as directed by your health care provider. After your symptoms improve, your health care provider may tell you to change your diet. He or she may recommend you eat a high-fiber diet. Fruits and vegetables are good sources of fiber. Fiber makes it easier to pass stool. °· Take fiber supplements or probiotics as directed by your health care provider. °· Only take medicines as directed by your health care provider. °· Keep all your follow-up appointments. °Contact a health care provider if: °· Your pain does not improve. °· You have a hard time eating food. °· Your bowel movements do not return to normal. °Get help right away if: °· Your pain becomes worse. °· Your symptoms do not get better. °· Your symptoms suddenly get worse. °· You have a fever. °· You have repeated  vomiting. °· You have bloody or black, tarry stools. °This information is not intended to replace advice given to you by your health care provider. Make sure you discuss any questions you have with your health care provider. °Document Released: 04/04/2005 Document Revised: 12/01/2015 Document Reviewed: 05/20/2013 °Elsevier Interactive Patient Education © 2017 [employer] ° °

## 2016-12-07 NOTE — Anesthesia Preprocedure Evaluation (Signed)
Anesthesia Evaluation  Patient identified by MRN, date of birth, ID band Patient awake    Reviewed: Allergy & Precautions, H&P , NPO status , Patient's Chart, lab work & pertinent test results  Airway Mallampati: II  TM Distance: >3 FB Neck ROM: full    Dental no notable dental hx. (+) Dental Advisory Given, Teeth Intact   Pulmonary neg pulmonary ROS, former smoker,    Pulmonary exam normal breath sounds clear to auscultation       Cardiovascular Exercise Tolerance: Good hypertension, Normal cardiovascular exam Rhythm:regular Rate:Normal  Grade 2 diastolic dysfunction. PVCs   Neuro/Psych Vertigo. Aphasia of unknown origin. negative neurological ROS  negative psych ROS   GI/Hepatic Neg liver ROS, GERD  Medicated and Controlled,  Endo/Other  negative endocrine ROS  Renal/GU negative Renal ROS     Musculoskeletal   Abdominal   Peds  Hematology negative hematology ROS (+) anemia ,   Anesthesia Other Findings   Reproductive/Obstetrics negative OB ROS                             Anesthesia Physical  Anesthesia Plan  ASA: III  Anesthesia Plan: General   Post-op Pain Management:    Induction: Intravenous  Airway Management Planned: Oral ETT  Additional Equipment:   Intra-op Plan:   Post-operative Plan: Extubation in OR  Informed Consent: I have reviewed the patients History and Physical, chart, labs and discussed the procedure including the risks, benefits and alternatives for the proposed anesthesia with the patient or authorized representative who has indicated his/her understanding and acceptance.   Dental Advisory Given  Plan Discussed with: CRNA  Anesthesia Plan Comments:         Anesthesia Quick Evaluation

## 2016-12-07 NOTE — Transfer of Care (Signed)
Immediate Anesthesia Transfer of Care Note  Patient: [name]  Procedure(s) Performed: Procedure(s): XI ROBOT DISTAL SIGMOID  COLECTOMY ERAS PATHWAY (N/A) RIGID PROCTOSCOPY (N/A)  Patient Location: PACU  Anesthesia Type:General  Level of Consciousness: awake, alert  and patient cooperative  Airway & Oxygen Therapy: Patient Spontanous Breathing and Patient connected to face mask oxygen  Post-op Assessment: Report given to RN and Post -op Vital signs reviewed and stable  Post vital signs: Reviewed and stable  Last Vitals:  Vitals:   12/07/16 0805  BP: 130/73  Pulse: 90  Resp: 18  Temp: 36.6 C    Last Pain:  Vitals:   12/07/16 0834  TempSrc:   PainSc: 0-No pain      Patients Stated Pain Goal: 3 (29/56/21 3086)  Complications: No apparent anesthesia complications

## 2016-12-07 NOTE — Interval H&P Note (Signed)
History and Physical Interval Note:  12/07/2016 9:31 AM  [name]  has presented today for surgery, with the diagnosis of Recurent  and chronic sigmoid diverticulitis  The various methods of treatment have been discussed with the patient and family. After consideration of risks, benefits and other options for treatment, the patient has consented to  Procedure(s): XI [place] (N/A) RIGID PROCTOSCOPY (N/A) as a surgical intervention .  The patient's history has been reviewed, patient examined, no change in status, stable for surgery.  I have reviewed the patient's chart and labs.  Questions were answered to the patient's satisfaction.    I have re-reviewed the the patient's records, history, medications, and allergies.  I have re-examined the patient.  I again discussed intraoperative plans and goals of post-operative recovery.  The patient agrees to proceed.  [name]  1966-02-08 [id number]  Patient Care Team: System, Pcp Not In as PCP - Huston Foley, MD as Consulting Physician (General Surgery) Armbruster, Renelda Loma, MD as Consulting Physician (Gastroenterology) Pyrtle, Lajuan Lines, MD as Consulting Physician (Gastroenterology)  Patient Active Problem List   Diagnosis Date Noted  . Preventative health care 08/04/2014  . Cyst of joint of shoulder 08/04/2014  . Palpitations 09/18/2013  . Menopausal symptoms 09/18/2013  . Obesity (BMI 30-39.9) 03/12/2013  . Ear ache 03/12/2013  . Yeast infection 09/07/2011  . Diverticulosis of sigmoid colon 08/24/2011  . Aphasia of unknown origin 05/24/2011  . GERD (gastroesophageal reflux disease) 05/24/2011  . BPPV (benign paroxysmal positional vertigo) 05/07/2011  . Hypertension 05/07/2011  . PVC (premature ventricular contraction) 05/07/2011    Past Medical History:  Diagnosis Date  . Anemia    hx of prior to hysterectomy  . BPPV (benign paroxysmal positional vertigo) 05/07/11   Described as room spinning, notes 2 episodes prior to Oct 2012.  Plans to undergo vestibular rehab.  . Diverticulitis    with microperforation  . Diverticulosis   . GERD (gastroesophageal reflux disease)   . Hyperplastic colon polyp   . Hypertension 05/07/11   with grade 2 diastolic dysfunction not on meds x 3 years   . PVC (premature ventricular contraction) 05/07/11   incidentally seen on telemetry    Past Surgical History:  Procedure Laterality Date  . CERVICAL BIOPSY  W/ LOOP ELECTRODE EXCISION    . CHOLECYSTECTOMY    . COLONOSCOPY    . LAPAROSCOPIC CHOLECYSTECTOMY SINGLE SITE WITH INTRAOPERATIVE CHOLANGIOGRAM N/A 05/27/2015   Procedure: LAPAROSCOPIC CHOLECYSTECTOMY SINGLE SITE WITH INTRAOPERATIVE CHOLANGIOGRAM;  Surgeon: Michael Boston, MD;  Location: [facility];  Service: General;  Laterality: N/A;  . TONSILLECTOMY    . UMBILICAL HERNIA REPAIR     Approximately 1974  . VAGINAL HYSTERECTOMY  07/2010    Social History   Social History  . Marital status: Single    Spouse name: N/A  . Number of children: 2  . Years of education: N/A   Occupational History  .  [employer]   Social History Main Topics  . Smoking status: Former Smoker    Packs/day: 0.10    Years: 20.00    Types: Cigarettes    Quit date: 07/09/2005  . Smokeless tobacco: Never Used     Comment: 1 black & mild a week  . Alcohol use Yes     Comment: once every few months   . Drug use: No  . Sexual activity: Yes    Birth control/ protection: None   Other Topics Concern  .  Not on file   Social History Narrative   Has two children and one granddaughter. Lives by herself.    Family History  Problem Relation Age of Onset  . Diabetes Mother   . Breast cancer Mother   . Diabetes Maternal Grandmother   . Cervical cancer Maternal Aunt   . Deep vein thrombosis Father        died from cerebral embolus  . Colon cancer Neg Hx   . Esophageal cancer Neg Hx   . Rectal cancer Neg Hx   . Stomach cancer Neg Hx      Current Facility-Administered Medications  Medication Dose Route Frequency Provider Last Rate Last Dose  . bisacodyl (DULCOLAX) EC tablet 20 mg  20 mg Oral Once Michael Boston, MD      . bupivacaine liposome (EXPAREL) 1.3 % injection 266 mg  20 mL Infiltration On Call to OR Michael Boston, MD      . cefoTEtan (CEFOTAN) 2 g in dextrose 5 % 50 mL IVPB  2 g Intravenous Gorden Harms, MD      . cefoTEtan in Dextrose 5% (CEFOTAN) 2-2.08 GM-% IVPB           . clindamycin (CLEOCIN) 900 mg, gentamicin (GARAMYCIN) 240 mg in sodium chloride 0.9 % 1,000 mL for intraperitoneal lavage   Intraperitoneal To OR Michael Boston, MD      . lactated ringers infusion   Intravenous Continuous Annye Asa, MD 75 mL/hr at 12/07/16 0849       Allergies  Allergen Reactions  . Lisinopril Cough  . Morphine And Related Nausea And Vomiting  . Latex Itching and Rash    BP 130/73   Pulse 90   Temp 97.8 F (36.6 C) (Oral)   Resp 18   Ht 5' 6.5" (1.689 m)   Wt 90.7 kg (200 lb)   SpO2 98%   BMI 31.80 kg/m   Labs: No results found for this or any previous visit (from the past 48 hour(s)).  Imaging / Studies: No results found.   Adin Hector, M.D., F.A.BarnesS. Gastrointestinal and Minimally Invasive Surgery [facility] [address], [place], [place] [address] [phone] Main / Paging  12/07/2016 9:32 AM    Susan [place] C.

## 2016-12-07 NOTE — Op Note (Signed)
12/07/2016  1:00 PM  PATIENT:  [name]  51 y.o. female  Patient Care Team: System, Pcp Not In as PCP - Huston Foley, MD as Consulting Physician (General Surgery) Armbruster, Renelda Loma, MD as Consulting Physician (Gastroenterology) Pyrtle, Lajuan Lines, MD as Consulting Physician (Gastroenterology)  PRE-OPERATIVE DIAGNOSIS:  Recurent  and chronic sigmoid diverticulitis  POST-OPERATIVE DIAGNOSIS:  Recurent and chronic sigmoid diverticulitis  PROCEDURE:   XI ROBOT DISTAL SIGMOID  COLECTOMY  RIGID PROCTOSCOPY  SURGEON:  Adin Hector, MD  ASSISTANT: Leighton Ruff, MD, FACS.   ANESTHESIA:   local and general  EBL:  Total I/O In: 1000 [I.V.:1000] Out: 100 [Blood:100]  Delay start of Pharmacological VTE agent (>24hrs) due to surgical blood loss or risk of bleeding:  no  DRAINS: none   SPECIMEN:  Rectosigmoid colon.  Open end proximal.  DISPOSITION OF SPECIMEN:  PATHOLOGY  COUNTS:  YES  PLAN OF CARE: Admit to inpatient   PATIENT DISPOSITION:  PACU - hemodynamically stable.  INDICATION:    Patient with recurrent episodes of lower abdominal pain.  Evidence of diverticulitis in the past.  Rest of the differential diagnosis unlikely.  Despite bowel regimen & adjustments, she still has recurrent attacks.  I recommended segmental resection:  The anatomy & physiology of the digestive tract was discussed.  The pathophysiology was discussed.  Natural history risks without surgery was discussed.   I worked to give an overview of the disease and the frequent need to have multispecialty involvement.  I feel the risks of no intervention will lead to serious problems that outweigh the operative risks; therefore, I recommended a partial colectomy to remove the pathology.  Laparoscopic & open techniques were discussed.   Risks such as bleeding, infection, abscess, leak, reoperation, possible ostomy, hernia, heart attack, death, and other risks were discussed.  I noted a good  likelihood this will help address the problem.   Goals of post-operative recovery were discussed as well.  We will work to minimize complications.  Educational materials on the pathology had been given in the office.  Questions were answered.    The patient expressed understanding & wished to proceed with surgery.  OR FINDINGS:   Patient had mild adhesions of rectosigmoid colon to anterior pelvis and bladder.  Minimal inflammation of retrosigmoid colonic mesentery.  Serosa not particularly inflamed.  No obvious metastatic disease on visceral parietal peritoneum or liver.  The anastomosis rests 14 cm from the anal verge by rigid proctoscopy.  DESCRIPTION:   Informed consent was confirmed.  The patient underwent general anaesthesia without difficulty.  The patient was positioned appropriately.  VTE prevention in place.  The patient's abdomen was clipped, prepped, & draped in a sterile fashion.  Surgical timeout confirmed our plan.  The patient was positioned in reverse Trendelenburg.  Abdominal entry was gained using Varess technique with a trach hook on the anterior abdominal wall fascia in the left upper abdomen.  Entry was clean.  I induced carbon dioxide insufflation.  Camera inspection revealed no injury.  Extra ports were carefully placed under direct laparoscopic visualization.  I reflected the greater omentum and the upper abdomen the small bowel in the upper abdomen.  The patient was carefully positioned.  The Intuitive daVinci robot was carefully docked with camera & instruments carefully placed.  The patient had mild adhesions of the rectosigmoid to the anterior bladder.  No major perforation major inflammation.  I mobilized the rectosigmoid colon off the anterior pelvis.  I elevated the rectosigmoid  colon to straighten out the mesentery.  I scored the base of peritoneum of the medial side of the mesentery of the left colon from the ligament of Treitz to the peritoneal reflection of the  mid rectum.   I elevated the sigmoid mesentery and entered into the retro-mesenteric plane. We were able to identify the left ureter and gonadal vessels. We kept those posterior within the retroperitoneum and elevated the left colon mesentery off that. I did isolate the inferior mesenteric artery (IMA) pedicle but did not ligate it yet.  I continued distally and got into the avascular plane posterior to the mesorectum. This allowed me to help mobilize the rectum as well by freeing the mesorectum off the sacrum.  I mobilized the peritoneal coverings towards the peritoneal reflection on both the right and left sides of the rectum.  I stayed away from the right and left ureters.  I kept the lateral vascular pedicles to the rectum intact.  I skeletonized the lymph nodes off the inferior mesenteric artery pedicle.  I went down to its takeoff from the aorta.  I isolated the inferior mesenteric vein off of the ligament of Treitz just cephalad to that as well.  After confirming the left ureter was out of the way, I went ahead and ligated the inferior mesenteric artery pedicle just near its takeoff from the aorta.  I did ligate the inferior mesenteric vein in a similar fashion.  We ensured hemostasis. I skeletonized the mesorectum at the junction at the proximal rectum for the distal point of resection.  I mobilized the left colon in a lateral to medial fashion off the line of Toldt up towards the splenic flexure to ensure good mobilization of the remaining left colon to reach into the pelvis.  I skeletonized at the proximal mesorectum and transected at the proximal rectum using a robotic 45 mm stapler.  I chose a region at the descending/sigmoid junction that was soft and easily reached down to the rectal stump.  I transected the mesentery of the colon radially to preserve remaining colon blood supply.  I created an extraction incision through a small Pfannenstiel incision in the suprapubic region.  Placed a wound  protector.  I was able to eviscerate the rectosigmoid and descending colon out the wound.   I clamped the colon proximal to this area using a reusable pursestringer device.  Passed a 2-0 Keith needle. I transected at the descending/sigmoid junction with a scalpel. I got healthy bleeding mucosa.  We sent the rectosigmoid colon specimen off to go to pathology.  We sized the colon orifice.  I chose a 33 EEA anvil stapler system.  I reinforced the prolene pursestring with interrupted silk suture.  I placed the anvil to the open end of the proximal remaining colon and closed around it using the pursestring.    We did copious irrigation with crystalloid solution.  Hemostasis was good.  The distal end of the remaining colon easily reached down to the rectal stump, therefore, splenic flexure mobilization was not needed.      Leighton Ruff, MD scrubbed down and did gentle anal dilation and advanced the EEA stapler up the rectal stump. The spike was brought out at the provimal end of the rectal stump under direct visualization.  I attached the anvil of the proximal colon the spike of the stapler. Anvil was tightened down and held clamped for 60 seconds. The EEA stapler was fired and held clamped for 30 seconds. The stapler was released &  removed. We noted 2 excellent anastomotic rings. Blue stitch is in the proximal ring.  Dr Marcello Moores did rigid proctoscopy noted the anastomosis was at 14 cm from the anal verge consistent with the proximal rectum.  We did a final irrigation of antibiotic solution (900 mg clindamycin/240 mg gentamicin in a liter of crystalloid) & held that for the pelvic air leak test .  The rectum was insufflated the rectum while clamping the colon proximal to that anastomosis.  There was a negative air leak test. There was no tension of mesentery or bowel at the anastomosis.   Tissues looked viable.  Ureters & bowel uninjured.  The anastomosis looked healthy.  I closed the 12 mm stapler port site with a 0  Vicryl using a laparoscopic suture passer under direct visualization.  Endoluminal gas was evacuated.  Ports & wound protector removed.  We changed gloves & redraped the patient per colon SSI prevention protocol.  We aspirated the antibiotic irrigation.  Hemostasis was good.  Sterile unused instruments were used from this point.  I closed the skin at the port sites using Monocryl stitch and sterile dressing.  I closed the extraction wound using a 0 Vicryl vertical peritoneal closure and a #1 PDS transverse anterior rectal fascial closure like a small Pfannenstiel closure. I closed the skin with some interrupted Monocryl stitches. I placed antibiotic-soaked wicks into the closure at the corners x2.  I placed sterile dressings.     Patient is being extubated go to recovery room. I had discussed postop care with the patient in detail the office & in the holding area. Instructions are written.  I discussed operative findings, updated the patient's status, discussed probable steps to recovery, and gave postoperative recommendations to the patient's family.  Recommendations were made.  Questions were answered.  She expressed understanding & appreciation.   Adin Hector, M.D., F.A.C.S. Gastrointestinal and Minimally Invasive Surgery [facility] [address] [place], [place] [address] [phone] Main / Paging

## 2016-12-07 NOTE — Anesthesia Procedure Notes (Addendum)
Procedure Name: Intubation Date/Time: 12/07/2016 10:43 AM Performed by: Dione Booze Pre-anesthesia Checklist: Emergency Drugs available, Suction available, Patient being monitored and Patient identified Patient Re-evaluated:Patient Re-evaluated prior to inductionOxygen Delivery Method: Circle system utilized Preoxygenation: Pre-oxygenation with 100% oxygen Intubation Type: IV induction Ventilation: Mask ventilation without difficulty Laryngoscope Size: Glidescope and 4 Grade View: Grade II Tube type: Oral Tube size: 7.5 mm Number of attempts: 1 Airway Equipment and Method: Stylet and Video-laryngoscopy Placement Confirmation: ETT inserted through vocal cords under direct vision,  positive ETCO2 and breath sounds checked- equal and bilateral Secured at: 22 cm Tube secured with: Tape Dental Injury: Teeth and Oropharynx as per pre-operative assessment  Comments: Previous Grade 3 intubation according to op note, glidescope used electively.

## 2016-12-07 NOTE — H&P (View-Only) (Signed)
[name] 11/13/2016 8:54 AM Location: [facility] Patient #: [id number] DOB: 16-Mar-1966 Single / Language: Cleophus Molt / Race: Black or African American Female  Patient Care Team: Default, Provider, MD as PCP - Huston Foley, MD as Consulting Physician (General Surgery) Armbruster, Renelda Loma, MD as Consulting Physician (Gastroenterology) Pyrtle, Lajuan Lines, MD as Consulting Physician (Gastroenterology)   History of Present Illness Adin Hector MD; 11/13/2016 9:31 AM) The patient is a 51 year old female who presents with diverticulitis. Note for "Diverticulitis": Patient returns with repeated episodes of diverticulitis.   Surgical consultation requested by her gastroenterologist, Dr. Havery Moros & Pyrtle, and Ellouise Newer PA with [facility] gastroenterology.  Pleasant woman. At at least one documented evidence of diverticulitis in 2013. Colonoscopy showed sigmoid diverticulosis mild and proximal sigmoid colon. Hyperplastic polyp removed. No edematous polyps or any other issues. Had another flare in 2016. Never perforated. No abscess or fistula formation. Underwent cholecystectomy 5643 for biliary colic. Because she had had only 2 non-complicated flares, we held off on any sigmoid colectomy at that time. I have not seen her then. Patient notes that she has had intermittent flares of left lower quadrant pain for the past couple years. Usually eats once or twice a month. She'll get all low grade fever and LLQ pain. Crampiness can last for a couple hours. Occasionally can last all day.   I saw her last month. Suspicious for diverticulitis but some CT scans not showing definite diverticulitis. I recommended colonoscopy to rule out other etiologies. Diverticulosis noted with diverticulitis noted on endoscopy. Small tubular adenoma noted. Some narrowing but no major stricture in the sigmoid colon. Rest evaluation underwhelming. Colectomy recommended.  Patient notes she has been on oral antibiotics since then. She's having a good week without pain but notes that several times a month she will get recurrent attacks of pain. She started to other people that of had colectomies for diverticulitis with good results. She is very interested in proceeding with surgery to cut the problem area out and break the cycle pain and attacks.  No personal nor family history of GI/colon cancer, inflammatory bowel disease, irritable bowel syndrome, allergy such as Celiac Sprue, dietary/dairy problems, colitis, ulcers nor gastritis. No recent sick contacts/gastroenteritis. No travel outside the country. No changes in diet. No dysphagia to solids or liquids. No significant heartburn or reflux. No hematochezia, hematemesis, coffee ground emesis. No evidence of prior gastric/peptic ulceration.   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 Colon Polyp Removal - Colonoscopy  Hysterectomy (not due to cancer) - Complete  Oral Surgery  Tonsillectomy   Diagnostic Studies History Adin Hector, MD; 11/13/2016 9:10 AM) Colonoscopy  1-5 years ago Mammogram  within last year Pap Smear  1-5 years ago  Allergies Lars Mage Spillers, CMA; 11/13/2016 8:55 AM) Morphine Sulfate (Concentrate) *ANALGESICS - OPIOID*  Latex Exam Gloves *MEDICAL DEVICES AND SUPPLIES*   Medication History Illene Regulus, CMA; 11/13/2016 8:55 AM) Medications Reconciled  Social History Adin Hector, MD; 11/13/2016 9:10 AM) Alcohol use  Occasional alcohol use. Caffeine use   Coffee. No drug use  Tobacco use  Former smoker.  Family History Adin Hector, MD;  11/13/2016 9:10 AM) Breast Cancer  Mother. Cervical Cancer  Family Members In General. Diabetes Mellitus  Mother. Hypertension  Father, Mother.  Pregnancy / Birth History Adin Hector, MD; 11/13/2016 9:10 AM) Age at menarche  54 years. Age of menopause  56-50 Gravida  3 Maternal age  58-20 Para  2  Other Problems Adin Hector, MD; 11/13/2016 9:10 AM) Cholelithiasis  Diverticulosis  Gastroesophageal Reflux Disease  High blood pressure  Other disease, cancer, significant illness     Review of Systems Adin Hector, MD; 11/13/2016 9:10 AM) General Present- Appetite Loss. Not Present- Chills, Fatigue, Fever, Night Sweats, Weight Gain and Weight Loss. Skin Not Present- Change in Wart/Mole, Dryness, Hives, Jaundice, New Lesions, Non-Healing Wounds, Rash and Ulcer. HEENT Present- Wears glasses/contact lenses. Not Present- Earache, Hearing Loss, Hoarseness, Nose Bleed, Oral Ulcers, Ringing in the Ears, Seasonal Allergies, Sinus Pain, Sore Throat, Visual Disturbances and Yellow Eyes. Respiratory Not Present- Bloody sputum, Chronic Cough, Difficulty Breathing, Snoring and Wheezing. Breast Not Present- Breast Mass, Breast Pain, Nipple Discharge and Skin Changes. Cardiovascular Not Present- Chest Pain, Difficulty Breathing Lying Down, Leg Cramps, Palpitations, Rapid Heart Rate, Shortness of Breath and Swelling of Extremities. Gastrointestinal Present- Abdominal Pain, Bloating, Change in Bowel Habits, Excessive gas, Indigestion and Nausea. Not Present- Bloody Stool, Chronic diarrhea, Constipation, Difficulty Swallowing, Gets full quickly at meals, Hemorrhoids, Rectal Pain and Vomiting. Female Genitourinary Not Present- Frequency, Nocturia, Painful Urination, Pelvic Pain and Urgency. Musculoskeletal Present- Back Pain. Not Present- Joint Pain, Joint Stiffness, Muscle Pain, Muscle Weakness  and Swelling of Extremities. Neurological Not Present- Decreased Memory, Fainting, Headaches, Numbness, Seizures, Tingling, Tremor, Trouble walking and Weakness. Psychiatric Not Present- Anxiety, Bipolar, Change in Sleep Pattern, Depression, Fearful and Frequent crying. Endocrine Present- Hot flashes. Not Present- Cold Intolerance, Excessive Hunger, Hair Changes, Heat Intolerance and New Diabetes. Hematology Not Present- Easy Bruising, Excessive bleeding, Gland problems, HIV and Persistent Infections.  Vitals (Alisha Spillers CMA; 11/13/2016 8:54 AM) 11/13/2016 8:54 AM Weight: 203 lb Height: 66in Body Surface Area: 2.01 m Body Mass Index: 32.76 kg/m  Pulse: 82 (Regular)  BP: 130/82 (Sitting, Left Arm, Standard)       Physical Exam Adin Hector MD; 11/13/2016 9:29 AM) General Mental Status-Alert. General Appearance-Not in acute distress, Not Sickly. Orientation-Oriented X3. Hydration-Well hydrated. Voice-Normal.  Integumentary Global Assessment Normal Exam - Axillae: non-tender, no inflammation or ulceration, no drainage. and Distribution of scalp and body hair is normal. General Characteristics Temperature - normal warmth is noted.  Head and Neck Head-normocephalic, atraumatic with no lesions or palpable masses. Face Global Assessment - atraumatic, no absence of expression. Neck Global Assessment - no abnormal movements, no bruit auscultated on the right, no bruit auscultated on the left, no decreased range of motion, non-tender. Trachea-midline. Thyroid Gland Characteristics - non-tender.  Eye Eyeball - Left-Extraocular movements intact, No Nystagmus. Eyeball - Right-Extraocular movements intact, No Nystagmus. Cornea - Left-No Hazy. Cornea - Right-No Hazy. Sclera/Conjunctiva - Left-No scleral icterus, No Discharge. Sclera/Conjunctiva - Right-No scleral icterus, No Discharge. Pupil - Left-Direct reaction to light normal. Pupil -  Right-Direct reaction to light normal.  ENMT Ears Pinna - Left - no drainage observed, no generalized tenderness observed. Right - no drainage observed, no generalized tenderness observed. Nose and Sinuses Nose - no destructive lesion observed. Nares - Left - quiet respiration. Right - quiet respiration. Mouth and Throat Lips - Upper Lip - no fissures observed, no pallor noted. Lower Lip - no fissures observed, no pallor noted. Nasopharynx - no discharge  present. Oral Cavity/Oropharynx - Tongue - no dryness observed. Oral Mucosa - no cyanosis observed. Hypopharynx - no evidence of airway distress observed.  Chest and Lung Exam Inspection Movements - Normal and Symmetrical. Accessory muscles - No use of accessory muscles in breathing. Palpation Normal exam - Non-tender. Auscultation Breath sounds - Normal and Clear.  Cardiovascular Auscultation Rhythm - Regular. Murmurs & Other Heart Sounds - Normal exam - No Murmurs and No Systolic Clicks.  Abdomen Inspection Normal Exam - No Visible peristalsis and No Abnormal pulsations. Umbilicus - No Bleeding, No Urine drainage. Palpation/Percussion Normal exam - Soft, Non Tender, No Rebound tenderness, No Rigidity (guarding) and No Cutaneous hyperesthesia. Note: Abdomen soft.  Minimal discomfort left lower quadrant. Otherwise nontender, nondistended. No guarding. No diastasis. No umbilical nor other hernias   Female Genitourinary Sexual Maturity Tanner 5 - Adult hair pattern. Note: No vaginal bleeding nor discharge   Peripheral Vascular Upper Extremity Inspection - Left - No Cyanotic nailbeds, Not Ischemic. Right - No Cyanotic nailbeds, Not Ischemic.  Neurologic Neurologic evaluation reveals -normal attention span and ability to concentrate, able to name objects and repeat phrases. Appropriate fund of knowledge , normal sensation and normal coordination. Mental Status Affect - not angry, not paranoid. Cranial  Nerves-Normal Bilaterally. Gait-Normal.  Neuropsychiatric Mental status exam performed with findings of-able to articulate well with normal speech/language, rate, volume and coherence, thought content normal with ability to perform basic computations and apply abstract reasoning and no evidence of hallucinations, delusions, obsessions or homicidal/suicidal ideation.  Musculoskeletal Global Assessment Spine, Ribs and Pelvis - no instability, subluxation or laxity. Right Upper Extremity - no instability, subluxation or laxity.  Lymphatic Head & Neck General Head & Neck Lymphatics: Bilateral - Description - No Localized lymphadenopathy. Axillary General Axillary Region: Bilateral - Description - No Localized lymphadenopathy. Femoral & Inguinal Generalized Femoral & Inguinal Lymphatics: Left: Right - Description - No Localized lymphadenopathy. Description - No Localized lymphadenopathy.    Assessment & Plan Adin Hector MD; 11/13/2016 9:27 AM) DIVERTICULITIS OF LARGE INTESTINE WITHOUT PERFORATION OR ABSCESS WITHOUT BLEEDING (K57.32) Impression: Evidence of diverticulitis and sigmoid colon classic location of her pain with persistent recurrent episodes of pain. At least 2 episodes of diverticulitis documented. The differential diagnosis seems unlikely.  I think she would benefit from robotically assisted sigmoid colectomy. She is very interested in proceeding. She is hopeful that removing the pulmonary will help break the cycle attacks. Imus as well. Good candidate for a minimally invasive/robotic approach. CHRONIC CONSTIPATION (K59.09) Current Plans Pt Education - [facility] Good Bowel Health (Kabella Cassidy) DIVERTICULOSIS LARGE INTESTINE W/O PERFORATION OR ABSCESS W/O BLEEDING (K57.30) Current Plans Pt Education - [facility] Diverticular Disease (AT) PREOP COLON - ENCOUNTER FOR PREOPERATIVE EXAMINATION FOR GENERAL SURGICAL PROCEDURE (Z01.818) Current Plans You are being scheduled for surgery- Our  schedulers will call you.  You should hear from our office's scheduling department within 5 working days about the location, date, and time of surgery. We try to make accommodations for patient's preferences in scheduling surgery, but sometimes the OR schedule or the surgeon's schedule prevents [place] from making those accommodations.  If you have not heard from our office [phone]) in 5 working days, call the office and ask for your surgeon's nurse.  If you have other questions about your diagnosis, plan, or surgery, call the office and ask for your surgeon's nurse.  Written instructions provided Pt Education - Pamphlet Given - Laparoscopic Colorectal Surgery: discussed with patient and provided information. Pt Education - [facility] Colon Bowel Prep  2015 Miralax/Antibiotics Started Neomycin Sulfate 500MG , 2 (two) Tablet SEE NOTE, #6, 11/13/2016, No Refill. Local Order: TAKE TWO TABLETS AT 2 PM, 3 PM, AND 10 PM THE DAY PRIOR TO SURGERY Started Flagyl 500MG , 2 (two) Tablet SEE NOTE, #6, 11/13/2016, No Refill. Local Order: Take at 2pm, 3pm, and 10pm the day prior to your colon operation The anatomy & physiology of the digestive tract was discussed. The pathophysiology of the colon was discussed. Natural history risks without surgery was discussed. I feel the risks of no intervention will lead to serious problems that outweigh the operative risks; therefore, I recommended a partial colectomy to remove the pathology. Minimally invasive (Robotic/Laparoscopic) & open techniques were discussed.  Risks such as bleeding, infection, abscess, leak, reoperation, possible ostomy, hernia, heart attack, death, and other risks were discussed. I noted a good likelihood this will help address the problem. Goals of post-operative recovery were discussed as well. Need for adequate nutrition, daily bowel regimen and healthy physical activity, to optimize recovery was noted as well. We will work to minimize  complications. Educational materials were available as well. Questions were answered. The patient expresses understanding & wishes to proceed with surgery.  Pt Education - [facility] Colectomy post-op instructions: discussed with patient and provided information.  Adin Hector, M.D., F.A.C.S. Gastrointestinal and Minimally Invasive Surgery [facility] [address], [place], [place] [address] [phone] Main / Paging

## 2016-12-08 ENCOUNTER — Encounter (HOSPITAL_COMMUNITY): Payer: Self-pay | Admitting: Surgery

## 2016-12-08 LAB — TYPE AND SCREEN
ABO/RH(D): A POS
ANTIBODY SCREEN: NEGATIVE
UNIT DIVISION: 0
UNIT DIVISION: 0

## 2016-12-08 LAB — MAGNESIUM: MAGNESIUM: 1.7 mg/dL (ref 1.7–2.4)

## 2016-12-08 LAB — BPAM RBC
BLOOD PRODUCT EXPIRATION DATE: 201806072359
Blood Product Expiration Date: 201806072359
ISSUE DATE / TIME: 201805301453
ISSUE DATE / TIME: 201805301253
UNIT TYPE AND RH: 6200
UNIT TYPE AND RH: 6200

## 2016-12-08 LAB — CBC
HCT: 34 % — ABNORMAL LOW (ref 36.0–46.0)
Hemoglobin: 11.9 g/dL — ABNORMAL LOW (ref 12.0–15.0)
MCH: 32.2 pg (ref 26.0–34.0)
MCHC: 35 g/dL (ref 30.0–36.0)
MCV: 91.9 fL (ref 78.0–100.0)
Platelets: 213 10*3/uL (ref 150–400)
RBC: 3.7 MIL/uL — ABNORMAL LOW (ref 3.87–5.11)
RDW: 12.3 % (ref 11.5–15.5)
WBC: 13.3 10*3/uL — ABNORMAL HIGH (ref 4.0–10.5)

## 2016-12-08 LAB — BASIC METABOLIC PANEL
Anion gap: 6 (ref 5–15)
BUN: 11 mg/dL (ref 6–20)
CO2: 23 mmol/L (ref 22–32)
Calcium: 8.9 mg/dL (ref 8.9–10.3)
Chloride: 110 mmol/L (ref 101–111)
Creatinine, Ser: 0.75 mg/dL (ref 0.44–1.00)
GFR calc Af Amer: >60 mL/min (ref >60)
Glucose, Bld: 95 mg/dL (ref 65–99)
POTASSIUM: 3.5 mmol/L (ref 3.5–5.1)
SODIUM: 139 mmol/L (ref 135–145)

## 2016-12-08 MED ORDER — IBUPROFEN 400 MG PO TABS: 400.0000 mg | ORAL_TABLET | Freq: Four times a day (QID) | ORAL | Status: DC | PRN

## 2016-12-08 MED ORDER — SODIUM CHLORIDE 0.9 % IV SOLN: 250.0000 mL | INTRAVENOUS | Status: DC | PRN

## 2016-12-08 MED ORDER — SODIUM CHLORIDE 0.9% FLUSH
3.0000 mL | Freq: Two times a day (BID) | INTRAVENOUS | Status: DC
  Administered 2016-12-08 – 2016-12-09 (×2): 3 mL via INTRAVENOUS

## 2016-12-08 MED ORDER — SODIUM CHLORIDE 0.9% FLUSH: 3.0000 mL | INTRAVENOUS | Status: DC | PRN

## 2016-12-08 NOTE — Progress Notes (Signed)
Pharmacy Brief Note - Alvimopan (Entereg)  The standing order set for alvimopan (Entereg) now includes an automatic order to discontinue the drug after the patient has had a bowel movement.  The change was approved by the [place] and the Medical Executive Committee.    This patient has had a bowel movement documented by nursing.  Therefore, alvimopan has been discontinued.  If there are questions, please contact the pharmacy at [phone].  Thank you  Gretta Arab PharmD, BCPS Pager [phone] 12/08/2016 2:16 PM

## 2016-12-08 NOTE — Progress Notes (Signed)
[address]  [place] [place], [place] [address] Phone: [phone]  FAX: [facility] [phone] 10/11/1965  CARE TEAM:  PCP: System, Pcp Not In  Outpatient Care Team: Patient Care Team: System, Pcp Not In as PCP - Huston Foley, MD as Consulting Physician (General Surgery) Armbruster, Renelda Loma, MD as Consulting Physician (Gastroenterology) Pyrtle, Lajuan Lines, MD as Consulting Physician (Gastroenterology)  Inpatient Treatment Team: Treatment Team: Attending Provider: Michael Boston, MD; Technician: Merian Capron, NT; Registered Nurse: Sonda Rumble, RN   Problem List:   Principal Problem:   Recurrent diverticulitis s/p robotic sigmoid colectomy 12/07/2016 Active Problems:   Hypertension   GERD (gastroesophageal reflux disease)   Obesity (BMI 30-39.9)   1 Day Post-Op  12/07/2016  Procedure(s): XI ROBOT DISTAL SIGMOID  COLECTOMY ERAS PATHWAY RIGID PROCTOSCOPY   Assessment  Recovering  Plan:  -adv diet per ERAS pathway -GERD - Tx -VTE prophylaxis- SCDs, etc -mobilize as tolerated to help recovery  I updated the patient's status to the patient.  Recommendations were made.  Questions were answered.  She expressed understanding & appreciation.   20 minutes spent in review, evaluation, examination, counseling, and coordination of care.  More than 50% of that time was spent in counseling.  Adin Hector, M.D., F.A.C.S. Gastrointestinal and Minimally Invasive Surgery [facility] [address], [place], [place] [address] [phone] Main / Paging   12/08/2016    Subjective: (Chief complaint)  Denies pain Happy how things are going so far  Objective:  Vital signs:  Vitals:   12/07/16 2152 12/08/16 0207 12/08/16 0519 12/08/16 0645  BP: 117/68 (!) 105/58 110/66   Pulse: 66 62 67   Resp: 16 16 16    Temp: 98.6 F (37 C) 98.4 F (36.9  C) 98.6 F (37 C)   TempSrc: Oral Oral Oral   SpO2: 99% 98% 99%   Weight:    90.1 kg (198 lb 10.2 oz)  Height:        Last BM Date: 12/07/16  Intake/Output   Yesterday:  06/01 0701 - 06/02 0700 In: 3724.2 [P.O.:80; I.V.:3644.2] Out: 1475 [Urine:1375; Blood:100] This shift:  No intake/output data recorded.  Bowel function:  Flatus: YES  BM:  No  Drain: (No drain)   Physical Exam:  General: Pt awake/alert/oriented x4 in no acute distress Eyes: PERRL, normal EOM.  Sclera clear.  No icterus Neuro: CN II-XII intact w/o focal sensory/motor deficits. Lymph: No head/neck/groin lymphadenopathy Psych:  No delerium/psychosis/paranoia HENT: Normocephalic, Mucus membranes moist.  No thrush Neck: Supple, No tracheal deviation Chest: No chest wall pain w good excursion CV:  Pulses intact.  Regular rhythm MS: Normal AROM mjr joints.  No obvious deformity  Abdomen: Soft.  Nondistended.  Mildly tender at incisions only.  No evidence of peritonitis.  No incarcerated hernias.  Ext:  No deformity.  No mjr edema.  No cyanosis Skin: No petechiae / purpura  Results:   Labs: No results found for this or any previous visit (from the past 48 hour(s)).  Imaging / Studies: No results found.  Medications / Allergies: per chart  Antibiotics: Anti-infectives    Start     Dose/Rate Route Frequency Ordered Stop   12/07/16 2000  cefoTEtan (CEFOTAN) 2 g in dextrose 5 % 50 mL IVPB     2 g 100 mL/hr over 30 Minutes Intravenous Every 12 hours 12/07/16 1444 12/07/16 2027   12/07/16  1217  clindamycin (CLEOCIN) 900 mg, gentamicin (GARAMYCIN) 240 mg in sodium chloride 0.9 % 1,000 mL for intraperitoneal lavage  Status:  Discontinued       As needed 12/07/16 1219 12/07/16 1306   12/07/16 0824  cefoTEtan in Dextrose 5% (CEFOTAN) 2-2.08 GM-% IVPB    Comments:  Domenic Moras   : cabinet override      12/07/16 0824 12/07/16 1930   12/07/16 0811  cefoTEtan (CEFOTAN) 2 g in dextrose 5 % 50 mL IVPB      2 g 100 mL/hr over 30 Minutes Intravenous Every 12 hours 12/07/16 0811 12/07/16 1059   12/06/16 1415  clindamycin (CLEOCIN) 900 mg, gentamicin (GARAMYCIN) 240 mg in sodium chloride 0.9 % 1,000 mL for intraperitoneal lavage      Intraperitoneal To Surgery 12/06/16 1411 12/07/16 1415        Note: Portions of this report may have been transcribed using voice recognition software. Every effort was made to ensure accuracy; however, inadvertent computerized transcription errors may be present.   Any transcriptional errors that result from this process are unintentional.     Adin Hector, M.D., F.A.C.S. Gastrointestinal and Minimally Invasive Surgery [facility] [address], [place], [place] [address] [phone] Main / Paging   12/08/2016

## 2016-12-09 NOTE — Discharge Summary (Signed)
Physician Discharge Summary  [name] MRN: [id number] DOB/AGE: 15-Oct-1965  51 y.o.  Admit date: 12/07/2016 Discharge date:   Patient Care Team: System, Pcp Not In as PCP - Huston Foley, MD as Consulting Physician (General Surgery) Armbruster, Renelda Loma, MD as Consulting Physician (Gastroenterology) Pyrtle, Lajuan Lines, MD as Consulting Physician (Gastroenterology)  Discharge Diagnoses:  Principal Problem:   Recurrent diverticulitis s/p robotic sigmoid colectomy 12/07/2016 Active Problems:   Hypertension   GERD (gastroesophageal reflux disease)   Obesity (BMI 30-39.9)   POST-OPERATIVE DIAGNOSIS:   Recurent  and chronic sigmoid diverticulitis  SURGERY:  12/07/2016  Procedure(s): XI ROBOT DISTAL SIGMOID  COLECTOMY ERAS PATHWAY RIGID PROCTOSCOPY  SURGEON:    Surgeon(s): Michael Boston, MD Leighton Ruff, MD  Consults: None  Hospital Course:   The patient underwent the surgery above.  Postoperatively, the patient gradually mobilized and advanced to a solid diet.  Pain and other symptoms were treated aggressively.    By the time of discharge, the patient was walking well the hallways, eating food, having flatus.  Pain was well-controlled on an oral medications.  Based on meeting discharge criteria and continuing to recover, I felt it was safe for the patient to be discharged from the hospital to further recover with close followup. Postoperative recommendations were discussed in detail.  They are written as well.  Discharged Condition: good  Disposition:  Follow-up Information    Michael Boston, MD. Schedule an appointment as soon as possible for a visit in 3 week(s).   Specialty:  General Surgery Why:  To follow up after your operation, To follow up after your hospital stay Contact information: [place] [address] [phone]           01-Home or Self Care  Discharge Instructions    Call MD for:    Complete  by:  As directed    FEVER > 101.5 F  (temperatures < 101.5 F are not significant)   Call MD for:    Complete by:  As directed    FEVER > 101.5 F  (temperatures < 101.5 F are not significant)   Call MD for:  extreme fatigue    Complete by:  As directed    Call MD for:  extreme fatigue    Complete by:  As directed    Call MD for:  persistant dizziness or light-headedness    Complete by:  As directed    Call MD for:  persistant dizziness or light-headedness    Complete by:  As directed    Call MD for:  persistant nausea and vomiting    Complete by:  As directed    Call MD for:  persistant nausea and vomiting    Complete by:  As directed    Call MD for:  redness, tenderness, or signs of infection (pain, swelling, redness, odor or green/yellow discharge around incision site)    Complete by:  As directed    Call MD for:  redness, tenderness, or signs of infection (pain, swelling, redness, odor or green/yellow discharge around incision site)    Complete by:  As directed    Call MD for:  severe uncontrolled pain    Complete by:  As directed    Call MD for:  severe uncontrolled pain    Complete by:  As directed    Diet - low sodium heart healthy    Complete by:  As directed    Follow a light diet  the first few days at home.   Start with a bland diet such as soups, liquids, starchy foods, low fat foods, etc.   If you feel full, bloated, or constipated, stay on a full liquid or pureed/blenderized diet for a few days until you feel better and no longer constipated. Be sure to drink plenty of fluids every day to avoid getting dehydrated (feeling dizzy, not urinating, etc.). Gradually add a fiber supplement to your diet   Diet - low sodium heart healthy    Complete by:  As directed    Follow a light diet the first few days at home.   Start with a bland diet such as soups, liquids, starchy foods, low fat foods, etc.   If you feel full, bloated, or constipated, stay on a full liquid or  pureed/blenderized diet for a few days until you feel better and no longer constipated. Be sure to drink plenty of fluids every day to avoid getting dehydrated (feeling dizzy, not urinating, etc.). Gradually add a fiber supplement to your diet   Discharge instructions    Complete by:  As directed    See Discharge Instructions If you are not getting better after two weeks or are noticing you are getting worse, contact our office [phone] for further advice.  We may need to adjust your medications, re-evaluate you in the office, send you to the emergency room, or see what other things we can do to help. The clinic staff is available to answer your questions during regular business hours (8:30am-5pm).  Please don't hesitate to call and ask to speak to one of our nurses for clinical concerns.    A surgeon from [facility] is always on call at the hospitals 24 hours/day If you have a medical emergency, go to the nearest emergency room or call 911.   Discharge instructions    Complete by:  As directed    See Discharge Instructions If you are not getting better after two weeks or are noticing you are getting worse, contact our office [phone] for further advice.  We may need to adjust your medications, re-evaluate you in the office, send you to the emergency room, or see what other things we can do to help. The clinic staff is available to answer your questions during regular business hours (8:30am-5pm).  Please don't hesitate to call and ask to speak to one of our nurses for clinical concerns.    A surgeon from [facility] is always on call at the hospitals 24 hours/day If you have a medical emergency, go to the nearest emergency room or call 911.   Driving Restrictions    Complete by:  As directed    You may drive when you are no longer taking narcotic prescription pain medication, you can comfortably wear a seatbelt, and you can safely make sudden turns/stops to  protect yourself without hesitating due to pain.   Driving Restrictions    Complete by:  As directed    You may drive when you are no longer taking narcotic prescription pain medication, you can comfortably wear a seatbelt, and you can safely make sudden turns/stops to protect yourself without hesitating due to pain.   Increase activity slowly    Complete by:  As directed    Start light daily activities --- self-care, walking, climbing stairs- beginning the day after surgery.  Gradually increase activities as tolerated.  Control your pain to be active.  Stop when you are tired.  Ideally, walk several times a day, eventually an hour a day.   Most people are back to most day-to-day activities in a few weeks.  It takes 4-8 weeks to get back to unrestricted, intense activity. If you can walk 30 minutes without difficulty, it is safe to try more intense activity such as jogging, treadmill, bicycling, low-impact aerobics, swimming, etc. Save the most intensive and strenuous activity for last (Usually 4-8 weeks after surgery) such as sit-ups, heavy lifting, contact sports, etc.  Refrain from any intense heavy lifting or straining until you are off narcotics for pain control.  You will have off days, but things should improve week-by-week. DO NOT PUSH THROUGH PAIN.  Let pain be your guide: If it hurts to do something, don't do it.  Pain is your body warning you to avoid that activity for another week until the pain goes down.   Increase activity slowly    Complete by:  As directed    Start light daily activities --- self-care, walking, climbing stairs- beginning the day after surgery.  Gradually increase activities as tolerated.  Control your pain to be active.  Stop when you are tired.  Ideally, walk several times a day, eventually an hour a day.   Most people are back to most day-to-day activities in a few weeks.  It takes 4-8 weeks to get back to unrestricted, intense activity. If you can walk 30 minutes  without difficulty, it is safe to try more intense activity such as jogging, treadmill, bicycling, low-impact aerobics, swimming, etc. Save the most intensive and strenuous activity for last (Usually 4-8 weeks after surgery) such as sit-ups, heavy lifting, contact sports, etc.  Refrain from any intense heavy lifting or straining until you are off narcotics for pain control.  You will have off days, but things should improve week-by-week. DO NOT PUSH THROUGH PAIN.  Let pain be your guide: If it hurts to do something, don't do it.  Pain is your body warning you to avoid that activity for another week until the pain goes down.   Lifting restrictions    Complete by:  As directed    If you can walk 30 minutes without difficulty, it is safe to try more intense activity such as jogging, treadmill, bicycling, low-impact aerobics, swimming, etc. Save the most intensive and strenuous activity for last (Usually 4-8 weeks after surgery) such as sit-ups, heavy lifting, contact sports, etc.  Refrain from any intense heavy lifting or straining until you are off narcotics for pain control.  You will have off days, but things should improve week-by-week. DO NOT PUSH THROUGH PAIN.  Let pain be your guide: If it hurts to do something, don't do it.  Pain is your body warning you to avoid that activity for another week until the pain goes down.   Lifting restrictions    Complete by:  As directed    If you can walk 30 minutes without difficulty, it is safe to try more intense activity such as jogging, treadmill, bicycling, low-impact aerobics, swimming, etc. Save the most intensive and strenuous activity for last (Usually 4-8 weeks after surgery) such as sit-ups, heavy lifting, contact sports, etc.  Refrain from any intense heavy lifting or straining until you are off narcotics for pain control.  You will have off days, but things should improve week-by-week. DO NOT PUSH THROUGH PAIN.  Let pain be your guide: If it hurts to  do something, don't do it.  Pain is your body warning you  to avoid that activity for another week until the pain goes down.   May walk up steps    Complete by:  As directed    May walk up steps    Complete by:  As directed    No wound care    Complete by:  As directed    It is good for closed incision and even open wounds to be washed every day.  Shower every day.  Short baths are fine.  Wash the incisions and wounds clean with soap & water.    If you have a closed incision(s), wash the incision with soap & water every day.  You may leave closed incisions open to air if it is dry.   You may cover the incision with clean gauze & replace it after your daily shower for comfort. If you have skin tapes (Steristrips) or skin glue (Dermabond) on your incision, leave them in place.  They will fall off on their own like a scab.  You may trim any edges that curl up with clean scissors.  If you have staples, set up an appointment for them to be removed in the office in 10 days after surgery.  If you have a drain, wash around the skin exit site with soap & water and place a new dressing of gauze or band aid around the skin every day.  Keep the drain site clean & dry.   No wound care    Complete by:  As directed    It is good for closed incision and even open wounds to be washed every day.  Shower every day.  Short baths are fine.  Wash the incisions and wounds clean with soap & water.    If you have a closed incision(s), wash the incision with soap & water every day.  You may leave closed incisions open to air if it is dry.   You may cover the incision with clean gauze & replace it after your daily shower for comfort. If you have skin tapes (Steristrips) or skin glue (Dermabond) on your incision, leave them in place.  They will fall off on their own like a scab.  You may trim any edges that curl up with clean scissors.  If you have staples, set up an appointment for them to be removed in the office in 10 days  after surgery.  If you have a drain, wash around the skin exit site with soap & water and place a new dressing of gauze or band aid around the skin every day.  Keep the drain site clean & dry.   Sexual Activity Restrictions    Complete by:  As directed    You may have sexual intercourse when it is comfortable. If it hurts to do something, stop.   Sexual Activity Restrictions    Complete by:  As directed    You may have sexual intercourse when it is comfortable. If it hurts to do something, stop.      Allergies as of 12/09/2016      Reactions   Lisinopril Cough   Morphine And Related Nausea And Vomiting   Latex Itching, Rash      Medication List    STOP taking these medications   ondansetron 4 MG disintegrating tablet Commonly known as:  ZOFRAN ODT     TAKE these medications   CHROMIUM PICOLATE PO Take 1 capsule by mouth daily.   famotidine 20 MG tablet Commonly known as:  PEPCID Take  20 mg by mouth daily as needed for heartburn or indigestion.   ibuprofen 200 MG tablet Commonly known as:  ADVIL,MOTRIN Take 400 mg by mouth daily as needed for headache or moderate pain.   multivitamin with minerals Tabs tablet Take 1 tablet by mouth daily.   Probiotic Caps Take 1 capsule by mouth daily.   traMADol 50 MG tablet Commonly known as:  ULTRAM Take 1-2 tablets (50-100 mg total) by mouth every 6 (six) hours as needed for moderate pain or severe pain.       Significant Diagnostic Studies:  Results for orders placed or performed during the hospital encounter of 12/07/16 (from the past 72 hour(s))  Basic metabolic panel     Status: None   Collection Time: 12/08/16  8:45 AM  Result Value Ref Range   Sodium 139 135 - 145 mmol/L   Potassium 3.5 3.5 - 5.1 mmol/L   Chloride 110 101 - 111 mmol/L   CO2 23 22 - 32 mmol/L   Glucose, Bld 95 65 - 99 mg/dL   BUN 11 6 - 20 mg/dL   Creatinine, Ser 0.75 0.44 - 1.00 mg/dL   Calcium 8.9 8.9 - 10.3 mg/dL   GFR calc non Af Amer >60 >60  mL/min   GFR calc Af Amer >60 >60 mL/min    Comment: (NOTE) The eGFR has been calculated using the CKD EPI equation. This calculation has not been validated in all clinical situations. eGFR's persistently <60 mL/min signify possible Chronic Kidney Disease.    Anion gap 6 5 - 15  CBC     Status: Abnormal   Collection Time: 12/08/16  8:45 AM  Result Value Ref Range   WBC 13.3 (H) 4.0 - 10.5 K/uL   RBC 3.70 (L) 3.87 - 5.11 MIL/uL   Hemoglobin 11.9 (L) 12.0 - 15.0 g/dL   HCT 34.0 (L) 36.0 - 46.0 %   MCV 91.9 78.0 - 100.0 fL   MCH 32.2 26.0 - 34.0 pg   MCHC 35.0 30.0 - 36.0 g/dL   RDW 12.3 11.5 - 15.5 %   Platelets 213 150 - 400 K/uL  Magnesium     Status: None   Collection Time: 12/08/16  8:45 AM  Result Value Ref Range   Magnesium 1.7 1.7 - 2.4 mg/dL    No results found.  Discharge Exam: Blood pressure 129/77, pulse 71, temperature 98.9 F (37.2 C), temperature source Oral, resp. rate 18, height 5' 6.5" (1.689 m), weight 96.8 kg (213 lb 6.5 oz), SpO2 98 %.  General: Pt awake/alert/oriented x4 in No acute distress Eyes: PERRL, normal EOM.  Sclera clear.  No icterus Neuro: CN II-XII intact w/o focal sensory/motor deficits. Lymph: No head/neck/groin lymphadenopathy Psych:  No delerium/psychosis/paranoia HENT: Normocephalic, Mucus membranes moist.  No thrush Neck: Supple, No tracheal deviation Chest: No chest wall pain w good excursion CV:  Pulses intact.  Regular rhythm MS: Normal AROM mjr joints.  No obvious deformity Abdomen: Soft.  Nondistended.  Mildly tender at incisions only.  Dressings removed.  Minimal old serosanguinous drainage at Pfannenstiel incision.  No evidence of peritonitis.  No incarcerated hernias. Ext:  SCDs BLE.  No mjr edema.  No cyanosis Skin: No petechiae / purpura  Past Medical History:  Diagnosis Date  . Anemia    hx of prior to hysterectomy  . BPPV (benign paroxysmal positional vertigo) 05/07/11   Described as room spinning, notes 2 episodes  prior to Oct 2012.  Plans to undergo vestibular rehab.  Marland Kitchen  Diverticulitis    with microperforation  . Diverticulosis   . Ear ache 03/12/2013  . GERD (gastroesophageal reflux disease)   . Hyperplastic colon polyp   . Hypertension 05/07/11   with grade 2 diastolic dysfunction not on meds x 3 years   . PVC (premature ventricular contraction) 05/07/11   incidentally seen on telemetry    Past Surgical History:  Procedure Laterality Date  . CERVICAL BIOPSY  W/ LOOP ELECTRODE EXCISION    . CHOLECYSTECTOMY    . COLONOSCOPY    . LAPAROSCOPIC CHOLECYSTECTOMY SINGLE SITE WITH INTRAOPERATIVE CHOLANGIOGRAM N/A 05/27/2015   Procedure: LAPAROSCOPIC CHOLECYSTECTOMY SINGLE SITE WITH INTRAOPERATIVE CHOLANGIOGRAM;  Surgeon: Michael Boston, MD;  Location: [facility];  Service: General;  Laterality: N/A;  . PROCTOSCOPY N/A 12/07/2016   Procedure: RIGID PROCTOSCOPY;  Surgeon: Michael Boston, MD;  Location: [facility];  Service: General;  Laterality: N/A;  . TONSILLECTOMY    . UMBILICAL HERNIA REPAIR     Approximately 1974  . VAGINAL HYSTERECTOMY  07/2010    Social History   Social History  . Marital status: Single    Spouse name: N/A  . Number of children: 2  . Years of education: N/A   Occupational History  .  [employer]   Social History Main Topics  . Smoking status: Former Smoker    Packs/day: 0.10    Years: 20.00    Types: Cigarettes    Quit date: 07/09/2005  . Smokeless tobacco: Never Used     Comment: 1 black & mild a week  . Alcohol use Yes     Comment: once every few months   . Drug use: No  . Sexual activity: Yes    Birth control/ protection: None   Other Topics Concern  . Not on file   Social History Narrative   Has two children and one granddaughter. Lives by herself.    Family History  Problem Relation Age of Onset  . Diabetes Mother   . Breast cancer Mother   . Diabetes Maternal Grandmother   . Cervical cancer Maternal Aunt   . Deep vein thrombosis Father        died from  cerebral embolus  . Colon cancer Neg Hx   . Esophageal cancer Neg Hx   . Rectal cancer Neg Hx   . Stomach cancer Neg Hx     Current Facility-Administered Medications  Medication Dose Route Frequency Provider Last Rate Last Dose  . 0.9 %  sodium chloride infusion  250 mL Intravenous PRN Michael Boston, MD      . 0.9 %  sodium chloride infusion  250 mL Intravenous PRN Michael Boston, MD      . acetaminophen (TYLENOL) tablet 1,000 mg  1,000 mg Oral TID Michael Boston, MD   1,000 mg at 12/09/16 1050  . alum & mag hydroxide-simeth (MAALOX/MYLANTA) 200-200-20 MG/5ML suspension 30 mL  30 mL Oral Q6H PRN Michael Boston, MD      . diphenhydrAMINE (BENADRYL) 12.5 MG/5ML elixir 12.5 mg  12.5 mg Oral Q6H PRN Michael Boston, MD       Or  . diphenhydrAMINE (BENADRYL) injection 12.5 mg  12.5 mg Intravenous Q6H PRN Michael Boston, MD      . enoxaparin (LOVENOX) injection 40 mg  40 mg Subcutaneous Q24H Michael Boston, MD   40 mg at 12/08/16 1016  . famotidine (PEPCID) tablet 20 mg  20 mg Oral Daily PRN Michael Boston, MD      . gabapentin (NEURONTIN) capsule 300 mg  300 mg Oral Ardeen Fillers, MD   300 mg at 12/08/16 2238  . guaiFENesin-dextromethorphan (ROBITUSSIN DM) 100-10 MG/5ML syrup 10 mL  10 mL Oral Q4H PRN Michael Boston, MD      . hydrocortisone (ANUSOL-HC) 2.5 % rectal cream 1 application  1 application Topical QID PRN Michael Boston, MD      . hydrocortisone cream 1 % 1 application  1 application Topical TID PRN Michael Boston, MD      . HYDROmorphone (DILAUDID) injection 0.5-2 mg  0.5-2 mg Intravenous Q2H PRN Michael Boston, MD      . ibuprofen (ADVIL,MOTRIN) tablet 400 mg  400 mg Oral Q6H PRN Michael Boston, MD      . lactated ringers bolus 1,000 mL  1,000 mL Intravenous Q8H PRN Traniya Prichett, Remo Lipps, MD      . lactated ringers infusion 1,000 mL  1,000 mL Intravenous Q8H PRN Kyshawn Teal, Remo Lipps, MD      . lip balm (CARMEX) ointment 1 application  1 application Topical BID Michael Boston, MD   1 application at 81/82/99  2200  . magic mouthwash  15 mL Oral QID PRN Michael Boston, MD      . menthol-cetylpyridinium (CEPACOL) lozenge 3 mg  1 lozenge Oral PRN Michael Boston, MD      . methocarbamol (ROBAXIN) 1,000 mg in dextrose 5 % 50 mL IVPB  1,000 mg Intravenous Q6H PRN Michael Boston, MD      . methocarbamol (ROBAXIN) tablet 1,000 mg  1,000 mg Oral Q6H PRN Michael Boston, MD      . metoprolol tartrate (LOPRESSOR) injection 5 mg  5 mg Intravenous Q6H PRN Michael Boston, MD      . metoprolol tartrate (LOPRESSOR) tablet 12.5 mg  12.5 mg Oral Q12H PRN Michael Boston, MD      . multivitamin with minerals tablet 1 tablet  1 tablet Oral Daily Michael Boston, MD   1 tablet at 12/09/16 1050  . ondansetron (ZOFRAN) tablet 4 mg  4 mg Oral Q6H PRN Michael Boston, MD       Or  . ondansetron [facility]) injection 4 mg  4 mg Intravenous Q6H PRN Michael Boston, MD   4 mg at 12/07/16 1457  . phenol (CHLORASEPTIC) mouth spray 1-2 spray  1-2 spray Mouth/Throat PRN Michael Boston, MD      . prochlorperazine (COMPAZINE) injection 5-10 mg  5-10 mg Intravenous Q4H PRN Michael Boston, MD   5 mg at 12/07/16 1940  . saccharomyces boulardii (FLORASTOR) capsule 250 mg  250 mg Oral BID Michael Boston, MD   250 mg at 12/09/16 1050  . sodium chloride flush (NS) 0.9 % injection 3 mL  3 mL Intravenous Gorden Harms, MD   3 mL at 12/08/16 2200  . sodium chloride flush (NS) 0.9 % injection 3 mL  3 mL Intravenous PRN Michael Boston, MD      . sodium chloride flush (NS) 0.9 % injection 3 mL  3 mL Intravenous Gorden Harms, MD   3 mL at 12/09/16 1054  . sodium chloride flush (NS) 0.9 % injection 3 mL  3 mL Intravenous PRN Michael Boston, MD      . traMADol Veatrice Bourbon) tablet 50-100 mg  50-100 mg Oral Q6H PRN Michael Boston, MD         Allergies  Allergen Reactions  . Lisinopril Cough  . Morphine And Related Nausea And Vomiting  . Latex Itching and Rash    Signed: Morton Peters, M.D., F.A.C.S.  Gastrointestinal and Minimally  Invasive [facility] [address], [place], [place] [address] [phone] Main / Paging   12/09/2016, 11:55 AM

## 2016-12-09 NOTE — Progress Notes (Signed)
Discharged from floor via w/c for transport home by car. Belongings & family with pt. No changes in assessment. Kiefer Opheim  

## 2017-05-27 ENCOUNTER — Other Ambulatory Visit: Payer: Self-pay | Admitting: Internal Medicine

## 2017-05-27 ENCOUNTER — Other Ambulatory Visit: Payer: Self-pay

## 2017-05-27 DIAGNOSIS — Z1231 Encounter for screening mammogram for malignant neoplasm of breast: Secondary | ICD-10-CM

## 2017-05-28 ENCOUNTER — Ambulatory Visit: Payer: 59

## 2017-07-04 ENCOUNTER — Ambulatory Visit
Admission: RE | Admit: 2017-07-04 | Discharge: 2017-07-04 | Disposition: A | Discharge status: Home / Self Care | Payer: 59 | Source: Ambulatory Visit | Attending: Internal Medicine | Admitting: Internal Medicine

## 2017-07-04 DIAGNOSIS — Z1231 Encounter for screening mammogram for malignant neoplasm of breast: Secondary | ICD-10-CM

## 2017-07-05 ENCOUNTER — Other Ambulatory Visit: Payer: Self-pay | Admitting: Internal Medicine

## 2017-07-05 DIAGNOSIS — R928 Other abnormal and inconclusive findings on diagnostic imaging of breast: Secondary | ICD-10-CM

## 2017-07-11 ENCOUNTER — Ambulatory Visit
Admission: RE | Admit: 2017-07-11 | Discharge: 2017-07-11 | Disposition: A | Discharge status: Home / Self Care | Payer: 59 | Source: Ambulatory Visit | Attending: Internal Medicine | Admitting: Internal Medicine

## 2017-07-11 DIAGNOSIS — R928 Other abnormal and inconclusive findings on diagnostic imaging of breast: Secondary | ICD-10-CM

## 2017-07-17 ENCOUNTER — Encounter: Payer: Self-pay | Admitting: Internal Medicine

## 2017-07-17 ENCOUNTER — Other Ambulatory Visit: Payer: Self-pay

## 2017-07-17 ENCOUNTER — Ambulatory Visit (INDEPENDENT_AMBULATORY_CARE_PROVIDER_SITE_OTHER): Payer: 59 | Admitting: Internal Medicine

## 2017-07-17 VITALS — BP 138/77 | HR 89 | Temp 98.6°F | Ht 66.4 in | Wt 216.9 lb

## 2017-07-17 DIAGNOSIS — I10 Essential (primary) hypertension: Secondary | ICD-10-CM

## 2017-07-17 DIAGNOSIS — Z885 Allergy status to narcotic agent status: Secondary | ICD-10-CM | POA: Diagnosis not present

## 2017-07-17 DIAGNOSIS — K5732 Diverticulitis of large intestine without perforation or abscess without bleeding: Secondary | ICD-10-CM

## 2017-07-17 DIAGNOSIS — Z91040 Latex allergy status: Secondary | ICD-10-CM

## 2017-07-17 DIAGNOSIS — Z9049 Acquired absence of other specified parts of digestive tract: Secondary | ICD-10-CM

## 2017-07-17 DIAGNOSIS — Z888 Allergy status to other drugs, medicaments and biological substances status: Secondary | ICD-10-CM | POA: Diagnosis not present

## 2017-07-17 DIAGNOSIS — R232 Flushing: Secondary | ICD-10-CM

## 2017-07-17 DIAGNOSIS — Z87891 Personal history of nicotine dependence: Secondary | ICD-10-CM | POA: Diagnosis not present

## 2017-07-17 NOTE — Patient Instructions (Signed)
It was a pleasure to see you [name].  Your blood pressure was a little high today.  Please continue to work on healthy eating patterns, exercise as tolerated, and weight loss.  Please follow up with [place] again in 1 year or sooner if needed.   DASH Eating Plan DASH stands for "Dietary Approaches to Stop Hypertension." The DASH eating plan is a healthy eating plan that has been shown to reduce high blood pressure (hypertension). It may also reduce your risk for type 2 diabetes, heart disease, and stroke. The DASH eating plan may also help with weight loss. What are tips for following this plan? General guidelines  Avoid eating more than 2,300 mg (milligrams) of salt (sodium) a day. If you have hypertension, you may need to reduce your sodium intake to 1,500 mg a day.  Limit alcohol intake to no more than 1 drink a day for nonpregnant women and 2 drinks a day for men. One drink equals 12 oz of beer, 5 oz of wine, or 1 oz of hard liquor.  Work with your health care provider to maintain a healthy body weight or to lose weight. Ask what an ideal weight is for you.  Get at least 30 minutes of exercise that causes your heart to beat faster (aerobic exercise) most days of the week. Activities may include walking, swimming, or biking.  Work with your health care provider or diet and nutrition specialist (dietitian) to adjust your eating plan to your individual calorie needs. Reading food labels  Check food labels for the amount of sodium per serving. Choose foods with less than 5 percent of the Daily Value of sodium. Generally, foods with less than 300 mg of sodium per serving fit into this eating plan.  To find whole grains, look for the word "whole" as the first word in the ingredient list. Shopping  Buy products labeled as "low-sodium" or "no salt added."  Buy fresh foods. Avoid canned foods and premade or frozen meals. Cooking  Avoid adding salt when cooking. Use salt-free seasonings  or herbs instead of table salt or sea salt. Check with your health care provider or pharmacist before using salt substitutes.  Do not fry foods. Cook foods using healthy methods such as baking, boiling, grilling, and broiling instead.  Cook with heart-healthy oils, such as olive, canola, soybean, or sunflower oil. Meal planning   Eat a balanced diet that includes: ? 5 or more servings of fruits and vegetables each day. At each meal, try to fill half of your plate with fruits and vegetables. ? Up to 6-8 servings of whole grains each day. ? Less than 6 oz of lean meat, poultry, or fish each day. A 3-oz serving of meat is about the same size as a deck of cards. One egg equals 1 oz. ? 2 servings of low-fat dairy each day. ? A serving of nuts, seeds, or beans 5 times each week. ? Heart-healthy fats. Healthy fats called Omega-3 fatty acids are found in foods such as flaxseeds and coldwater fish, like sardines, salmon, and mackerel.  Limit how much you eat of the following: ? Canned or prepackaged foods. ? Food that is high in trans fat, such as fried foods. ? Food that is high in saturated fat, such as fatty meat. ? Sweets, desserts, sugary drinks, and other foods with added sugar. ? Full-fat dairy products.  Do not salt foods before eating.  Try to eat at least 2 vegetarian meals each week.  Eat  more home-cooked food and less restaurant, buffet, and fast food.  When eating at a restaurant, ask that your food be prepared with less salt or no salt, if possible. What foods are recommended? The items listed may not be a complete list. Talk with your dietitian about what dietary choices are best for you. Grains Whole-grain or whole-wheat bread. Whole-grain or whole-wheat pasta. Brown rice. [name]. Bulgur. Whole-grain and low-sodium cereals. Pita bread. Low-fat, low-sodium crackers. Whole-wheat flour tortillas. Vegetables Fresh or frozen vegetables (raw, steamed, roasted, or  grilled). Low-sodium or reduced-sodium tomato and vegetable juice. Low-sodium or reduced-sodium tomato sauce and tomato paste. Low-sodium or reduced-sodium canned vegetables. Fruits All fresh, dried, or frozen fruit. Canned fruit in natural juice (without added sugar). Meat and other protein foods Skinless chicken or [place]. Ground chicken or [place]. Pork with fat trimmed off. Fish and seafood. Egg whites. Dried beans, peas, or lentils. Unsalted nuts, nut butters, and seeds. Unsalted canned beans. Lean cuts of beef with fat trimmed off. Low-sodium, lean deli meat. Dairy Low-fat (1%) or fat-free (skim) milk. Fat-free, low-fat, or reduced-fat cheeses. Nonfat, low-sodium ricotta or cottage cheese. Low-fat or nonfat yogurt. Low-fat, low-sodium cheese. Fats and oils Soft margarine without trans fats. Vegetable oil. Low-fat, reduced-fat, or light mayonnaise and salad dressings (reduced-sodium). Canola, safflower, olive, soybean, and sunflower oils. Avocado. Seasoning and other foods Herbs. Spices. Seasoning mixes without salt. Unsalted popcorn and pretzels. Fat-free sweets. What foods are not recommended? The items listed may not be a complete list. Talk with your dietitian about what dietary choices are best for you. Grains Baked goods made with fat, such as croissants, muffins, or some breads. Dry pasta or rice meal packs. Vegetables Creamed or fried vegetables. Vegetables in a cheese sauce. Regular canned vegetables (not low-sodium or reduced-sodium). Regular canned tomato sauce and paste (not low-sodium or reduced-sodium). Regular tomato and vegetable juice (not low-sodium or reduced-sodium). [name]. Olives. Fruits Canned fruit in a light or heavy syrup. Fried fruit. Fruit in cream or butter sauce. Meat and other protein foods Fatty cuts of meat. Ribs. Fried meat. [name]. Sausage. Bologna and other processed lunch meats. Salami. Fatback. Hotdogs. Bratwurst. Salted nuts and seeds. Canned beans with  added salt. Canned or smoked fish. Whole eggs or egg yolks. Chicken or [place] with skin. Dairy Whole or 2% milk, cream, and half-and-half. Whole or full-fat cream cheese. Whole-fat or sweetened yogurt. Full-fat cheese. Nondairy creamers. Whipped toppings. Processed cheese and cheese spreads. Fats and oils Butter. Stick margarine. Lard. Shortening. Ghee. Bacon fat. Tropical oils, such as coconut, palm kernel, or palm oil. Seasoning and other foods Salted popcorn and pretzels. Onion salt, garlic salt, seasoned salt, table salt, and sea salt. Worcestershire sauce. Tartar sauce. Barbecue sauce. Teriyaki sauce. Soy sauce, including reduced-sodium. Steak sauce. Canned and packaged gravies. Fish sauce. Oyster sauce. Cocktail sauce. Horseradish that you find on the shelf. Ketchup. Mustard. Meat flavorings and tenderizers. Bouillon cubes. Hot sauce and Tabasco sauce. Premade or packaged marinades. Premade or packaged taco seasonings. Relishes. Regular salad dressings. Where to find more information:  National Heart, Lung, and [place]: https://wilson-eaton.com/  American Heart Association: www.heart.org Summary  The DASH eating plan is a healthy eating plan that has been shown to reduce high blood pressure (hypertension). It may also reduce your risk for type 2 diabetes, heart disease, and stroke.  With the DASH eating plan, you should limit salt (sodium) intake to 2,300 mg a day. If you have hypertension, you may need to reduce your sodium intake to  1,500 mg a day.  When on the DASH eating plan, aim to eat more fresh fruits and vegetables, whole grains, lean proteins, low-fat dairy, and heart-healthy fats.  Work with your health care provider or diet and nutrition specialist (dietitian) to adjust your eating plan to your individual calorie needs. This information is not intended to replace advice given to you by your health care provider. Make sure you discuss any questions you have with your health care  provider. Document Released: 06/14/2011 Document Revised: 06/18/2016 Document Reviewed: 06/18/2016 Elsevier Interactive Patient Education  [employer].

## 2017-07-17 NOTE — Progress Notes (Signed)
Internal Medicine Clinic Attending  Case discussed with Dr. Patel at the time of the visit.  We reviewed the resident's history and exam and pertinent patient test results.  I agree with the assessment, diagnosis, and plan of care documented in the resident's note.  

## 2017-07-17 NOTE — Progress Notes (Signed)
CC: HTN  HPI:  SusanSusan Barnes is a 52 y.o. female with PMH as listed below including HTN and recurrent and chronic sigmoid diverticulitis s/p distal sigmoid colectomy who presents for follow up management of her HTN. She was last seen in our clinic on 08/04/2014. Please see problem based charting for status of patient's chronic medical issues.  Hypertension [name] has a history of hypertension, not currently on antihypertensives. She previously was on Lisinopril which had the side effect of cough. She was later started on HCTZ with good blood pressure control. She worked on diet and exercised and was eventually taken off her HCTZ as her home BP monitoring was improved. She reports some weight gain recently and less exercise. She denies any chest pain, palpitations, dyspnea, edema, or change in vision. BP Readings from Last 3 Encounters:  07/17/17 138/77  12/09/16 129/77  12/04/16 (!) 152/92    A/P: BP is mildly elevated this visit at 138/77. I encouraged continuing to work on lifestyle modifications (DASH diet, exercise as tolerated, weight loss). She is agreeable.     Past Medical History:  Diagnosis Date  . Anemia    hx of prior to hysterectomy  . BPPV (benign paroxysmal positional vertigo) 05/07/11   Described as room spinning, notes 2 episodes prior to Oct 2012.  Plans to undergo vestibular rehab.  . Diverticulitis    with microperforation  . Diverticulosis   . Ear ache 03/12/2013  . GERD (gastroesophageal reflux disease)   . Hyperplastic colon polyp   . Hypertension 05/07/11   with grade 2 diastolic dysfunction not on meds x 3 years   . PVC (premature ventricular contraction) 05/07/11   incidentally seen on telemetry   Social History   Socioeconomic History  . Marital status: Single    Spouse name: None  . Number of children: 2  . Years of education: None  . Highest education level: None  Social Needs  . Financial resource strain: None  . Food insecurity  - worry: None  . Food insecurity - inability: None  . Transportation needs - medical: None  . Transportation needs - non-medical: None  Occupational History    Employer: [employer]  Tobacco Use  . Smoking status: Former Smoker    Packs/day: 0.10    Years: 20.00    Pack years: 2.00    Types: Cigarettes    Last attempt to quit: 07/09/2005    Years since quitting: 12.0  . Smokeless tobacco: Never Used  . Tobacco comment: 1 black & mild a week  Substance and Sexual Activity  . Alcohol use: Yes    Comment: Glass of wine once every few months   . Drug use: No  . Sexual activity: Yes    Birth control/protection: None  Other Topics Concern  . None  Social History Narrative   Has two children and one granddaughter. Lives by herself.   Family History  Problem Relation Age of Onset  . Diabetes Mother   . Breast cancer Mother   . Diabetes Maternal Grandmother   . Cervical cancer Maternal Aunt   . Deep vein thrombosis Father        died from cerebral embolus  . Colon cancer Neg Hx   . Esophageal cancer Neg Hx   . Rectal cancer Neg Hx   . Stomach cancer Neg Hx    Past Surgical History:  Procedure Laterality Date  . CERVICAL BIOPSY  W/ LOOP ELECTRODE EXCISION    . CHOLECYSTECTOMY    .  COLONOSCOPY    . LAPAROSCOPIC CHOLECYSTECTOMY SINGLE SITE WITH INTRAOPERATIVE CHOLANGIOGRAM N/A 05/27/2015   Procedure: LAPAROSCOPIC CHOLECYSTECTOMY SINGLE SITE WITH INTRAOPERATIVE CHOLANGIOGRAM;  Surgeon: Michael Boston, MD;  Location: [facility];  Service: General;  Laterality: N/A;  . PROCTOSCOPY N/A 12/07/2016   Procedure: RIGID PROCTOSCOPY;  Surgeon: Michael Boston, MD;  Location: [facility];  Service: General;  Laterality: N/A;  . TONSILLECTOMY    . UMBILICAL HERNIA REPAIR     Approximately 1974  . VAGINAL HYSTERECTOMY  07/2010    Allergies: Lisinopril (cough), Morphine (nausea and vomiting), Latex (rash and pruritus).  Review of Systems:   Review of Systems  Constitutional: Negative for chills,  fever, malaise/fatigue and weight loss.       Occasional hot flashes  Respiratory: Negative for cough and shortness of breath.   Cardiovascular: Negative for chest pain, palpitations and leg swelling.  Gastrointestinal: Negative for abdominal pain, blood in stool, constipation, diarrhea, melena, nausea and vomiting.  Genitourinary: Negative for dysuria.  Musculoskeletal: Negative for falls.  Neurological: Negative for dizziness and loss of consciousness.  Psychiatric/Behavioral: Negative for substance abuse.     Physical Exam:  Vitals:   07/17/17 0911  BP: 138/77  Pulse: 89  Temp: 98.6 F (37 C)  TempSrc: Oral  SpO2: 97%  Weight: 216 lb 14.4 oz (98.4 kg)  Height: 5' 6.4" (1.687 m)   Physical Exam  Constitutional: She is oriented to person, place, and time. She appears well-developed and well-nourished. No distress.  HENT:  Head: Normocephalic and atraumatic.  Mouth/Throat: Oropharynx is clear and moist. No oropharyngeal exudate.  Eyes: EOM are normal.  Neck: Neck supple. No tracheal deviation present. No thyromegaly present.  Cardiovascular: Normal rate and regular rhythm.  No murmur heard. Pulmonary/Chest: Effort normal. No respiratory distress. She has no wheezes. She has no rales.  Abdominal: Soft. Bowel sounds are normal. She exhibits no distension. There is no tenderness. There is no guarding.  Musculoskeletal: She exhibits no edema.  Lymphadenopathy:    She has no cervical adenopathy.  Neurological: She is alert and oriented to person, place, and time.  Skin: Skin is warm. Capillary refill takes less than 2 seconds. She is not diaphoretic.  Psychiatric: She has a normal mood and affect.    Assessment & Plan:   See Encounters Tab for problem based charting.  Patient discussed with Dr. Dareen Piano

## 2017-07-17 NOTE — Assessment & Plan Note (Addendum)
[name] has a history of hypertension, not currently on antihypertensives. She previously was on Lisinopril which had the side effect of cough. She was later started on HCTZ with good blood pressure control. She worked on diet and exercised and was eventually taken off her HCTZ as her home BP monitoring was improved. She reports some weight gain recently and less exercise. She denies any chest pain, palpitations, dyspnea, edema, or change in vision. BP Readings from Last 3 Encounters:  07/17/17 138/77  12/09/16 129/77  12/04/16 (!) 152/92    A/P: BP is mildly elevated this visit at 138/77. I encouraged continuing to work on lifestyle modifications (DASH diet, exercise as tolerated, weight loss). She is agreeable.

## 2018-01-28 ENCOUNTER — Encounter: Payer: Self-pay | Admitting: *Deleted

## 2018-04-04 ENCOUNTER — Ambulatory Visit (INDEPENDENT_AMBULATORY_CARE_PROVIDER_SITE_OTHER): Payer: 59 | Admitting: Internal Medicine

## 2018-04-04 ENCOUNTER — Other Ambulatory Visit: Payer: Self-pay

## 2018-04-04 ENCOUNTER — Encounter: Payer: Self-pay | Admitting: Internal Medicine

## 2018-04-04 VITALS — BP 116/89 | HR 97 | Temp 99.2°F | Ht 66.4 in | Wt 214.6 lb

## 2018-04-04 DIAGNOSIS — Z9889 Other specified postprocedural states: Secondary | ICD-10-CM

## 2018-04-04 DIAGNOSIS — Z8719 Personal history of other diseases of the digestive system: Secondary | ICD-10-CM

## 2018-04-04 DIAGNOSIS — Z8679 Personal history of other diseases of the circulatory system: Secondary | ICD-10-CM | POA: Diagnosis not present

## 2018-04-04 NOTE — Progress Notes (Signed)
   CC: Blood pressure follow up  HPI:  [name] is a 52 y.o. female with past medical history outlined below here for blood pressure follow up. For the details of today's visit, please refer to the assessment and plan.  Past Medical History:  Diagnosis Date  . Anemia    hx of prior to hysterectomy  . BPPV (benign paroxysmal positional vertigo) 05/07/11   Described as room spinning, notes 2 episodes prior to Oct 2012.  Plans to undergo vestibular rehab.  . Diverticulitis    with microperforation  . Diverticulosis   . Ear ache 03/12/2013  . GERD (gastroesophageal reflux disease)   . Hyperplastic colon polyp   . Hypertension 05/07/11   with grade 2 diastolic dysfunction not on meds x 3 years   . PVC (premature ventricular contraction) 05/07/11   incidentally seen on telemetry    Review of Systems  Cardiovascular: Negative for chest pain.  Endo/Heme/Allergies:       Hot flashes    Physical Exam:  Vitals:   04/04/18 1609  BP: 116/89  Pulse: 97  Temp: 99.2 F (37.3 C)  TempSrc: Oral  SpO2: 99%  Weight: 214 lb 9.6 oz (97.3 kg)  Height: 5' 6.4" (1.687 m)    Constitutional: NAD, appears comfortable Cardiovascular: RRR, no murmurs, rubs, or gallops.  Pulmonary/Chest: CTAB, no wheezes, rales, or rhonchi.  Extremities: Warm and well perfused. No edema.  Psychiatric: Normal mood and affect  Assessment & Plan:   See Encounters Tab for problem based charting.  Patient discussed with Dr. Dareen Piano

## 2018-04-04 NOTE — Patient Instructions (Addendum)
Ms. Verhoeven,  It was a pleasure to meet you. Your blood pressure today was normal, 116/89. Today we talked about a plan for your exercise. It can be helpful to start with setting small goals for yourself, like nightly walks, and increasing your activity from there. Having a workout partner can help hold you accountable as well. You do not need to check your blood pressure regularly, but if you do, 120/80 is normal. I would not be concerned unless your pressure is consistently above 130/90. You are doing a great job. Keep up the good work! If you have any questions or concerns, call our clinic at [phone] or after hours call [phone] and ask for the internal medicine resident on call. Thank you!  - Dr. Philipp Ovens

## 2018-04-04 NOTE — Assessment & Plan Note (Signed)
Patient is here for follow-up of her blood pressure.  She has a history of hypertension, previously on antihypertensive therapy.  She was able to come off therapy with diet and exercise a couple of years ago.  After surgery for diverticulitis 1 year ago, she reports a decrease in her exercise and a few pound weight gain.  She is compliant with a low-salt diet.  She has noticed intermittently high blood pressure readings over the past couple months.  Reports a reading with systolic pressure in the 321Y while at a dentist appointment.  She also checked her blood pressure recently at home when experiencing a headache and it was elevated.  Blood pressure today in clinic is 116/89.  She is asymptomatic.  Today we discussed a plan for her to resume exercising.  She is motivating to getting back on an exercise schedule. Of note, she is also going through menopause and admits the elevated blood pressure readings at home were accompanied by symptoms of hot flash and racing heart. I reassured her that her blood pressure today is normal, and does not require medical therapy.  Encouraged her to continue with low-salt diet, and restart her exercise regimen. -- Continue to monitor

## 2018-04-07 NOTE — Progress Notes (Signed)
Internal Medicine Clinic Attending  Case discussed with Dr. Guilloud at the time of the visit.  We reviewed the resident's history and exam and pertinent patient test results.  I agree with the assessment, diagnosis, and plan of care documented in the resident's note.  

## 2018-12-05 IMAGING — CT CT ABD-PELV W/ CM
2 of 5 series · 16 of 46 positions shown, 18 images · IV contrast (APPLIED)
Comparison: CT abdomen pelvis of 09/03/2016

CLINICAL DATA: Left lower quadrant pain

EXAM:
CT ABDOMEN AND PELVIS WITH CONTRAST
TECHNIQUE: Multidetector CT imaging of the abdomen and pelvis was performed
using the standard protocol following bolus administration of
intravenous contrast.
CONTRAST:  100mL 3XEE1C-TQQ IOPAMIDOL (3XEE1C-TQQ) INJECTION 61%

[Series 2: axial st · axial · 0.95mm/px · z∈[-341,+24]mm · 13 of 85 slices shown, 15 images]
[im 6/85  soft-tissue]
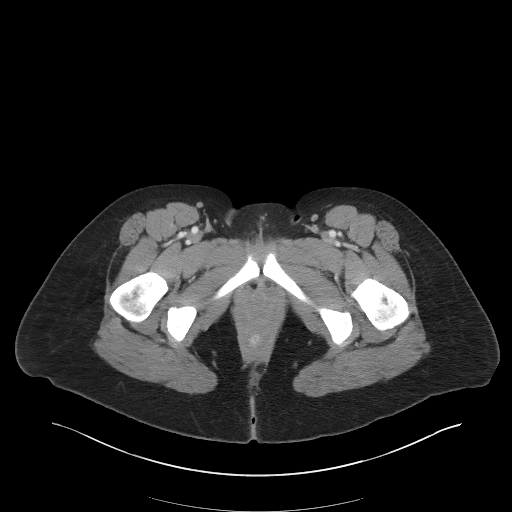
[im 6/85  bone]
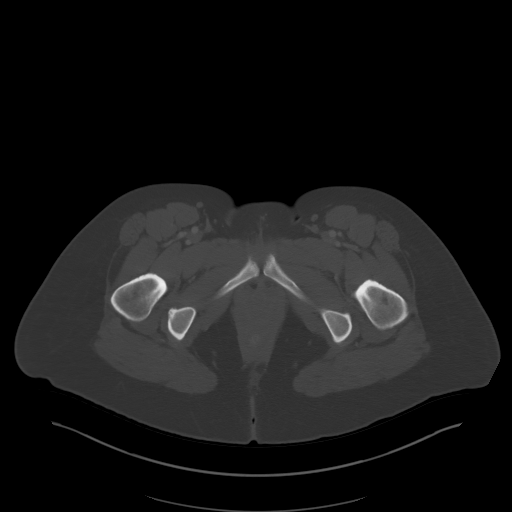
[im 11/85  soft-tissue]
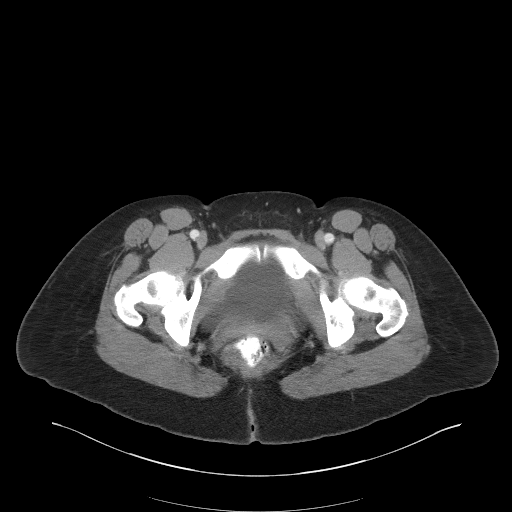
[im 16/85  soft-tissue]
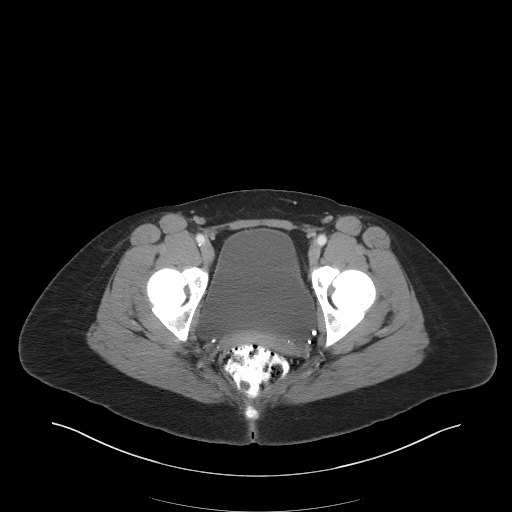
[im 27/85  soft-tissue]
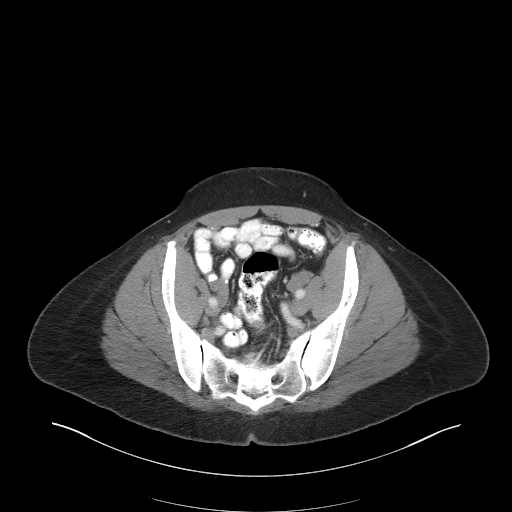
[im 32/85  soft-tissue]
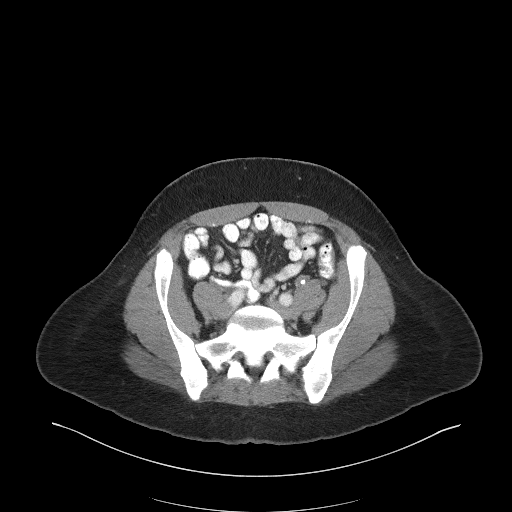
[im 37/85  soft-tissue]
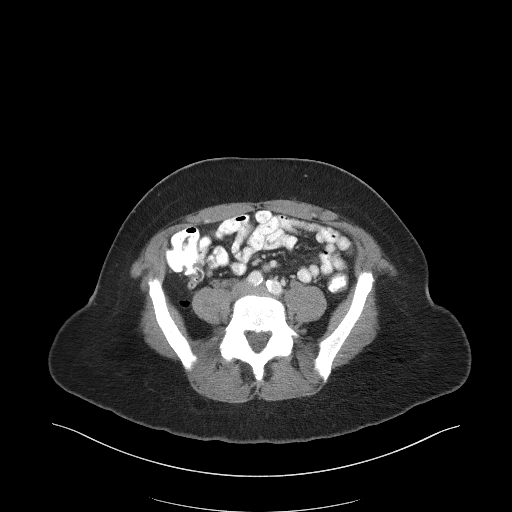
[im 43/85  soft-tissue]
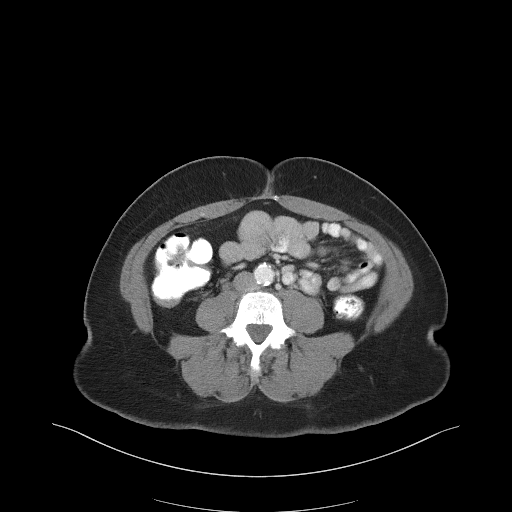
[im 48/85  soft-tissue]
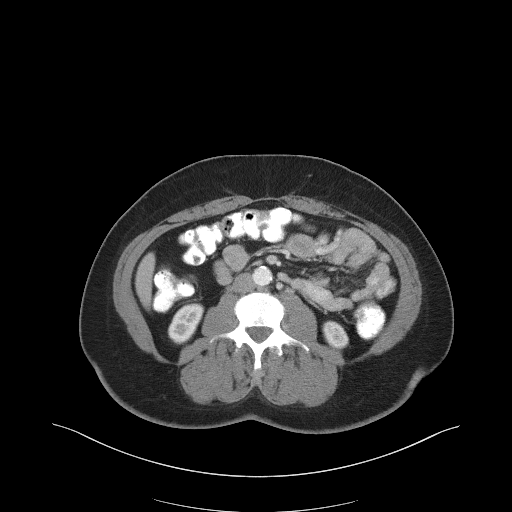
[im 53/85  soft-tissue]
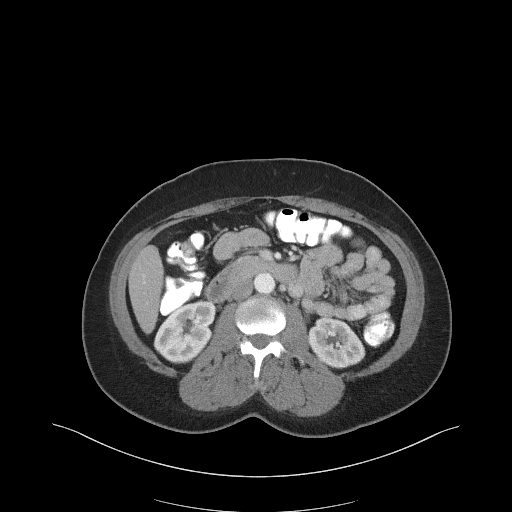
[im 53/85  bone]
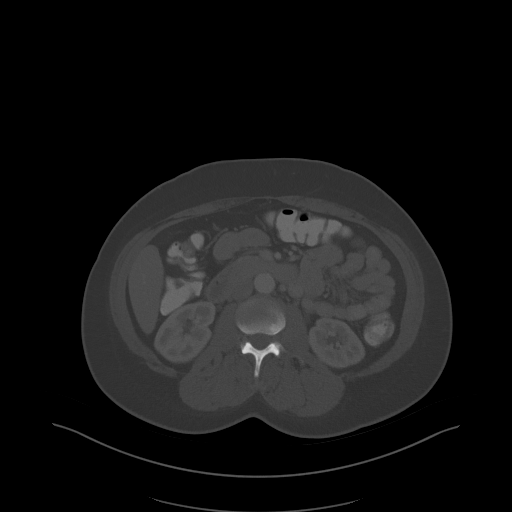
[im 58/85  soft-tissue]
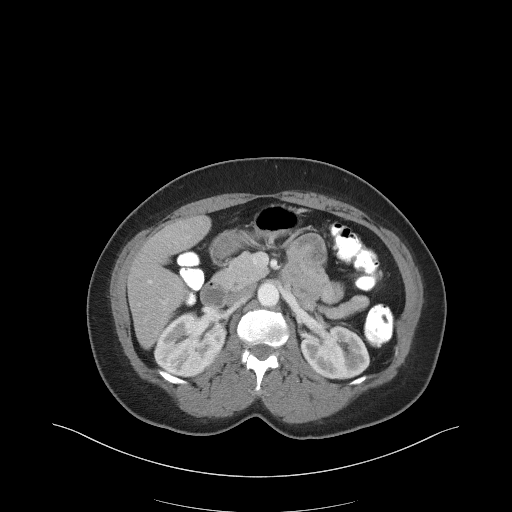
[im 69/85  soft-tissue]
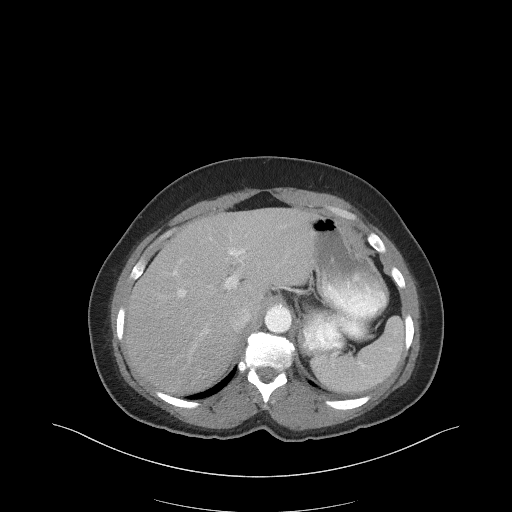
[im 74/85  soft-tissue]
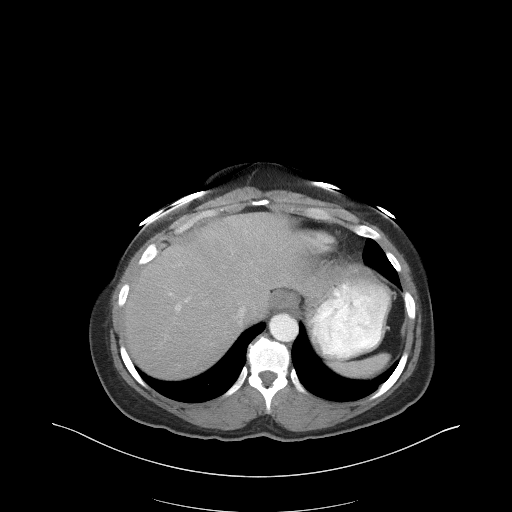
[im 79/85  soft-tissue]
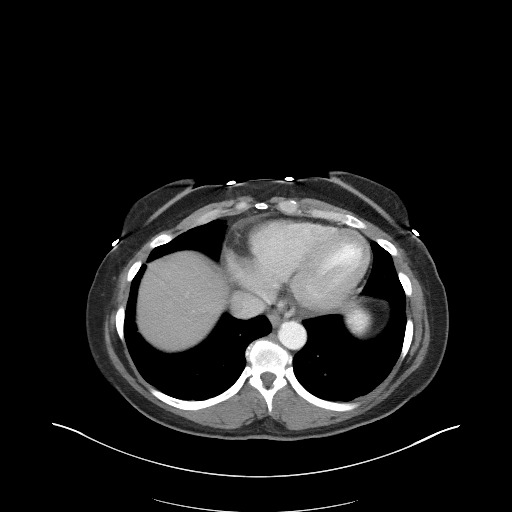

[Series 5: coronal st · coronal · 0.68mm/px · 3 of 95 slices shown]
[im 32/95  soft-tissue]
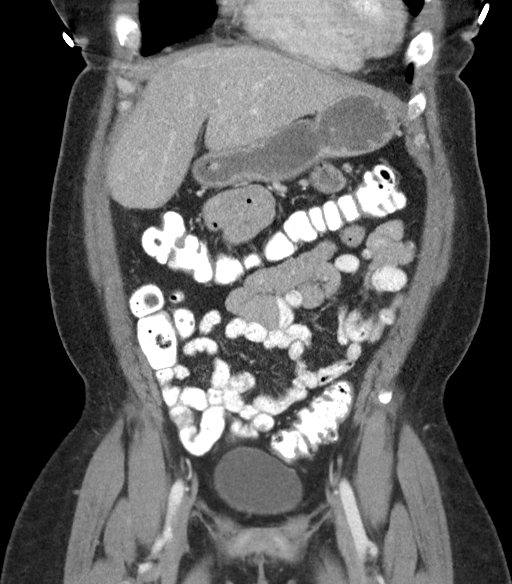
[im 42/95  soft-tissue]
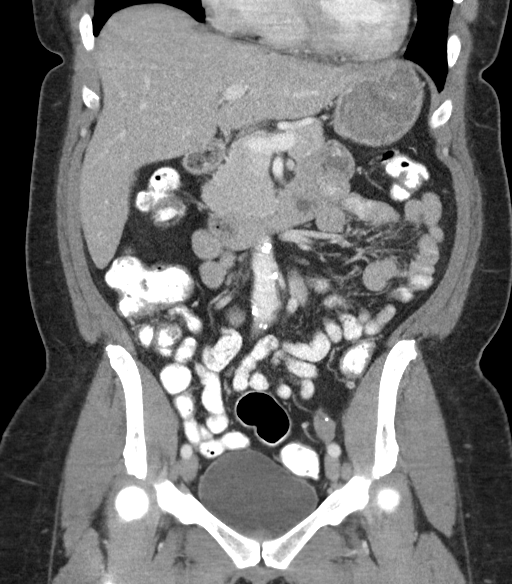
[im 53/95  soft-tissue]
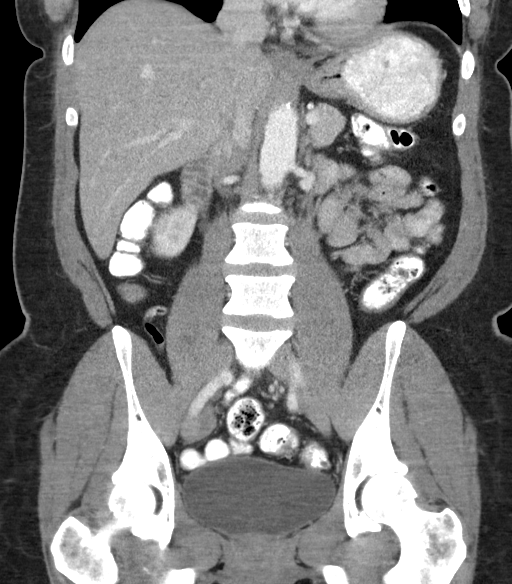

[16 of 46 positions shown; findings below may reference images not displayed]

FINDINGS: Lower chest: The lung bases are clear. The heart is within normal
limits in size.

Hepatobiliary: The liver enhances with no focal abnormality and no
ductal dilatation is seen. Surgical clips are present from prior
cholecystectomy.

Pancreas: The pancreas is normal in size and the pancreatic duct is
not dilated.

Spleen: The spleen is unremarkable.

Adrenals/Urinary Tract: The adrenal glands appear normal. The
kidneys enhance with no calculus or mass. On delayed images, the
pelvocaliceal systems are unremarkable and there is no evidence of
hydronephrosis. The ureters are normal in caliber and no ureteral
calculus is noted. The urinary bladder is moderately urine distended
with no abnormality noted.

Stomach/Bowel: The stomach is moderately fluid distended and no
abnormality is seen. No small bowel distention is noted. Contrast is
scattered throughout the colon and there are a few colonic
diverticula present. However there is no current evidence of
diverticulitis. The terminal ileum and the appendix are
unremarkable.

Vascular/Lymphatic: The abdominal aorta is normal in caliber
although there is significant abdominal aortic atherosclerosis for
age. No adenopathy is seen.

Reproductive: The uterus has previously been resected. No adnexal
lesion is seen, and no free fluid is noted within the pelvis.

Other: None.

Musculoskeletal: The lumbar vertebrae are in normal alignment. There
are degenerative changes involving the facet joints of the lower
lumbar spine. There are degenerative changes within the SI joints.
IMPRESSION: 1. No explanation for the patient's left lower quadrant pain is
seen. There are a few rectosigmoid colon diverticula, but no
diverticulitis is seen.
2. No renal or ureteral calculi are seen.
3. The appendix and terminal ileum are unremarkable.
4. Somewhat advanced abdominal aortic atherosclerosis for age.
5. Degenerative change of the facet joints of the lower lumbar
spine.

## 2018-12-16 ENCOUNTER — Telehealth: Payer: Self-pay | Admitting: Internal Medicine

## 2018-12-16 NOTE — Telephone Encounter (Signed)
Patient called said that she has extreme pain after she eat in the upper center abd. Would like to know what to do.

## 2018-12-16 NOTE — Telephone Encounter (Signed)
Pt states that for the past 5 days she has been having pain in the mid part of her abdomen. States pepcid has not helped with this but advil seems to help. Pt states each day the pain has become more intense. Pt requesting to see someone prior to Dr. Vena Rua first available. Pt scheduled to see Nicoletta Ba PA Monday at 10am, pt aware of appt.

## 2018-12-18 ENCOUNTER — Other Ambulatory Visit: Payer: Self-pay | Admitting: Internal Medicine

## 2018-12-18 DIAGNOSIS — Z1231 Encounter for screening mammogram for malignant neoplasm of breast: Secondary | ICD-10-CM

## 2018-12-22 ENCOUNTER — Encounter: Payer: Self-pay | Admitting: Physician Assistant

## 2018-12-22 ENCOUNTER — Ambulatory Visit (INDEPENDENT_AMBULATORY_CARE_PROVIDER_SITE_OTHER): Payer: 59 | Admitting: Physician Assistant

## 2018-12-22 ENCOUNTER — Other Ambulatory Visit: Payer: Self-pay

## 2018-12-22 VITALS — Ht 66.5 in | Wt 205.0 lb

## 2018-12-22 DIAGNOSIS — R1013 Epigastric pain: Secondary | ICD-10-CM | POA: Diagnosis not present

## 2018-12-23 ENCOUNTER — Encounter: Payer: Self-pay | Admitting: Physician Assistant

## 2018-12-23 NOTE — Progress Notes (Addendum)
Subjective:    This service was provided via telemedicine.  Telephone call.  The patient was located at home The provider was located in provider's GI office. The patient did consent to this telephone visit and is aware of possible charges through their insurance for this visit.   The persons participating in this telemedicine service were the patient and I. Time spent on call:  9 min   [name], female    DOB: 11/20/1965, 53 y.o.   MRN: [id number] Visit Type- Telehealth/phone by pt request HPI [name] is a pleasant 53 year old African-American female, known to Dr. Hilarie Fredrickson.Marland Kitchen  She was last seen in our office in 2018 at which time she underwent colonoscopy.  She was found to have a 3 mm a sending colon polyp and multiple rectosigmoid diverticuli as well as internal hemorrhoids.  Path on the polyp was consistent with a tubular adenoma and she is indicated for 5-year interval follow-up. Patient is status post cholecystectomy in 2016, also status post hysterectomy and prior umbilical hernia repair.  She has history of GERD and recurrent diverticulitis for which she underwent sigmoid colectomy. Patient had originally scheduled this appointment as an in person visit and changed to telehealth visit on the day of scheduled appointment.  She says she has been experiencing epigastric pain over the past few weeks which was generally occurring postprandially.  She had switched probiotics from a product called renew life ultra flora she had been taking fairly long-term to a new product called digestive advantage and says her symptoms started when she switched to probiotics.  She says she had just realized this when off of the new probiotic and says that her symptoms have completely resolved.  She had been experiencing a heavy achy type feeling after eating with the new probiotic.  She had also tried some Pepcid OTC did not notice any change in symptoms. She has gone back on the old probiotic  and says her symptoms are completely gone.  She specifically has no complaints of issues with appetite, nausea or vomiting, no changes in her bowel habits no melena or hematochezia.  Review of Systems Pertinent positive and negative review of systems were noted in the above HPI section.  All other review of systems was otherwise negative.  Outpatient Encounter Medications as of 12/22/2018  Medication Sig  . famotidine (PEPCID) 20 MG tablet Take 20 mg by mouth daily as needed for heartburn or indigestion.  . Multiple Vitamin (MULITIVITAMIN WITH MINERALS) TABS Take 1 tablet by mouth daily.  . Probiotic CAPS Take 1 capsule by mouth daily.  Marland Kitchen ibuprofen (ADVIL,MOTRIN) 200 MG tablet Take 400 mg by mouth daily as needed for headache or moderate pain.   No facility-administered encounter medications on file as of 12/22/2018.    Allergies  Allergen Reactions  . Lisinopril Cough  . Morphine And Related Nausea And Vomiting  . Latex Itching and Rash   Patient Active Problem List   Diagnosis Date Noted  . Recurrent diverticulitis s/p robotic sigmoid colectomy 12/07/2016 12/07/2016  . Preventative health care 08/04/2014  . Menopausal symptoms 09/18/2013  . Obesity (BMI 30-39.9) 03/12/2013  . GERD (gastroesophageal reflux disease) 05/24/2011  . History of hypertension 05/07/2011  . PVC (premature ventricular contraction) 05/07/2011   Social History   Socioeconomic History  . Marital status: Single    Spouse name: Not on file  . Number of children: 2  . Years of education: Not on file  . Highest education level: Not on  file  Occupational History    Employer: [employer]  Social Needs  . Financial resource strain: Not on file  . Food insecurity    Worry: Not on file    Inability: Not on file  . Transportation needs    Medical: Not on file    Non-medical: Not on file  Tobacco Use  . Smoking status: Former Smoker    Packs/day: 0.10    Years: 20.00    Pack years: 2.00    Types:  Cigarettes    Quit date: 07/09/2005    Years since quitting: 13.4  . Smokeless tobacco: Never Used  . Tobacco comment: 1 black & mild a week  Substance and Sexual Activity  . Alcohol use: Yes    Comment: Glass of wine once every few months   . Drug use: No  . Sexual activity: Yes    Birth control/protection: None  Lifestyle  . Physical activity    Days per week: Not on file    Minutes per session: Not on file  . Stress: Not on file  Relationships  . Social Herbalist on phone: Not on file    Gets together: Not on file    Attends religious service: Not on file    Active member of club or organization: Not on file    Attends meetings of clubs or organizations: Not on file    Relationship status: Not on file  . Intimate partner violence    Fear of current or ex partner: Not on file    Emotionally abused: Not on file    Physically abused: Not on file    Forced sexual activity: Not on file  Other Topics Concern  . Not on file  Social History Narrative   Has two children and one granddaughter. Lives by herself.    Ms. Tatem family history includes Breast cancer in her mother; Cervical cancer in her maternal aunt; Colon cancer (age of onset: 28) in her brother; Deep vein thrombosis in her father; Diabetes in her maternal grandmother and mother.      Objective:    There were no vitals filed for this visit.  Physical Exam; Telehealth visit, no physical exam done       Assessment & Plan:  #51 53 year old African-American female with recent episode of epigastric pain very likely related to change in probiotic. Patient had switched to a new probiotic and has since discontinued this with complete resolution of symptoms. She is back on her previous probiotic called renew life ultra flora and is doing well.  2 history of adenomatous colon polyp-due for follow-up colonoscopy 2023 3 diverticulosis status post prior sigmoid colectomy for recurrent diverticulitis #4  status post cholecystectomy #5.  Status post umbilical hernia repair #6 History of GERD, not requiring any chronic acid blocker, uses Pepcid as needed  Plan; No further work-up indicated at present.  Patient was advised to call the office for advice and/or appointment should she experience recurrence and symptoms. Also advised that she would be due for follow-up colonoscopy in 2023 with Dr. Lucille Passy PA-C 12/23/2018   Cc: Carroll Sage, MD   I have taken an interval history, reviewed the chart, and examined the patient. I agree with the Advanced Practitioner's note and impression.

## 2018-12-28 ENCOUNTER — Encounter: Payer: Self-pay | Admitting: *Deleted

## 2019-01-21 ENCOUNTER — Other Ambulatory Visit: Payer: Self-pay | Admitting: Internal Medicine

## 2019-01-21 DIAGNOSIS — Z20822 Contact with and (suspected) exposure to covid-19: Secondary | ICD-10-CM

## 2019-01-24 LAB — NOVEL CORONAVIRUS, NAA: SARS-CoV-2, NAA: NOT DETECTED

## 2019-02-03 ENCOUNTER — Other Ambulatory Visit: Payer: Self-pay

## 2019-02-03 ENCOUNTER — Ambulatory Visit
Admission: RE | Admit: 2019-02-03 | Discharge: 2019-02-03 | Disposition: A | Discharge status: Home / Self Care | Payer: 59 | Source: Ambulatory Visit | Attending: Internal Medicine | Admitting: Internal Medicine

## 2019-02-03 DIAGNOSIS — Z1231 Encounter for screening mammogram for malignant neoplasm of breast: Secondary | ICD-10-CM

## 2019-03-27 ENCOUNTER — Other Ambulatory Visit (HOSPITAL_COMMUNITY)
Admission: RE | Admit: 2019-03-27 | Discharge: 2019-03-27 | Disposition: A | Discharge status: Home / Self Care | Payer: 59 | Source: Ambulatory Visit | Attending: Internal Medicine | Admitting: Internal Medicine

## 2019-03-27 ENCOUNTER — Other Ambulatory Visit: Payer: Self-pay

## 2019-03-27 ENCOUNTER — Ambulatory Visit (INDEPENDENT_AMBULATORY_CARE_PROVIDER_SITE_OTHER): Payer: 59 | Admitting: Internal Medicine

## 2019-03-27 ENCOUNTER — Other Ambulatory Visit: Payer: Self-pay | Admitting: Internal Medicine

## 2019-03-27 VITALS — BP 137/83 | HR 97 | Temp 98.7°F | Ht 66.0 in | Wt 222.4 lb

## 2019-03-27 DIAGNOSIS — R358 Other polyuria: Secondary | ICD-10-CM | POA: Insufficient documentation

## 2019-03-27 DIAGNOSIS — E669 Obesity, unspecified: Secondary | ICD-10-CM | POA: Diagnosis not present

## 2019-03-27 DIAGNOSIS — I10 Essential (primary) hypertension: Secondary | ICD-10-CM | POA: Diagnosis not present

## 2019-03-27 DIAGNOSIS — K5790 Diverticulosis of intestine, part unspecified, without perforation or abscess without bleeding: Secondary | ICD-10-CM | POA: Diagnosis not present

## 2019-03-27 DIAGNOSIS — Z6835 Body mass index (BMI) 35.0-35.9, adult: Secondary | ICD-10-CM

## 2019-03-27 DIAGNOSIS — R3589 Other polyuria: Secondary | ICD-10-CM | POA: Insufficient documentation

## 2019-03-27 LAB — GLUCOSE, CAPILLARY: Glucose-Capillary: 114 mg/dL — ABNORMAL HIGH (ref 70–99)

## 2019-03-27 LAB — POCT GLYCOSYLATED HEMOGLOBIN (HGB A1C): Hemoglobin A1C: 5.6 % (ref 4.0–5.6)

## 2019-03-27 NOTE — Assessment & Plan Note (Signed)
The patient states that she has been having frequent urination for the past 1 week. She states that she has been urinating 12 times daily, previously 2-3 times. She is having to get up in the middle of the night to urinate.   Patient states that she is having excessive thirst, burning sensation with urination, fatigue, nausea, and clear colored vaginal discharge with accompanied burning sensation in vaginal area.   Assessment and plan  Will assess patient for urinary or vaginal infection and for diabetes.   -Ua and urine culture -urine test for gonorrhea, chlamydia, bv, trich, candida -A1C

## 2019-03-27 NOTE — Progress Notes (Signed)
   CC: Frequent urination  HPI:  [name] is a 53 y.o. female with obesity, htn, recurrent diverticulosis who presents to be evaluated for frequent urination. Please see problem based charting for evaluation, assessment, and plan.  Past Medical History:  Diagnosis Date  . Anemia    hx of prior to hysterectomy  . BPPV (benign paroxysmal positional vertigo) 05/07/11   Described as room spinning, notes 2 episodes prior to Oct 2012.  Plans to undergo vestibular rehab.  . Diverticulitis    with microperforation  . Diverticulosis   . Ear ache 03/12/2013  . GERD (gastroesophageal reflux disease)   . Hyperplastic colon polyp   . Hypertension 05/07/11   with grade 2 diastolic dysfunction not on meds x 3 years   . PVC (premature ventricular contraction) 05/07/11   incidentally seen on telemetry   Review of Systems:    Occasional dizziness or headaches Denies changes in appetite.   Physical Exam:  Vitals:   03/27/19 1118  BP: 137/83  Pulse: 97  Temp: 98.7 F (37.1 C)  TempSrc: Oral  SpO2: 96%  Weight: 222 lb 6.4 oz (100.9 kg)  Height: 5\' 6"  (1.676 m)   Physical Exam  Constitutional: Appears well-developed and well-nourished. No distress.  HENT:  Head: Normocephalic and atraumatic.  Eyes: Conjunctivae are normal.  Cardiovascular: Normal rate, regular rhythm and normal heart sounds.  Respiratory: Effort normal and breath sounds normal. No respiratory distress. No wheezes.  GI: Soft. Bowel sounds are normal. No distension. There is no tenderness.  Musculoskeletal: No edema.  Neurological: Is alert.  Skin: Not diaphoretic. No erythema.  Psychiatric: Normal mood and affect. Behavior is normal. Judgment and thought content normal.    Assessment & Plan:   See Encounters Tab for problem based charting.  Patient discussed with Dr. Lynnae January

## 2019-03-27 NOTE — Patient Instructions (Signed)
It was a pleasure to see you today [name]. Please make the following changes:  -I am checking your urine for a urinary tract infection and vaginal infection  -I am also checking a HIV test  -I got an A1c to evaluate for diabetes given your symptoms  If you have any questions or concerns, please call our clinic at [phone] between 9am-5pm and after hours call [phone] and ask for the internal medicine resident on call. If you feel you are having a medical emergency please call 911.   Thank you, we look forward to help you remain healthy!  Lars Mage, MD Internal Medicine PGY3

## 2019-03-27 NOTE — Addendum Note (Signed)
Addended by: Lars Mage on: 03/27/2019 11:49 AM   Modules accepted: Orders

## 2019-03-28 LAB — URINALYSIS, ROUTINE W REFLEX MICROSCOPIC
Bilirubin, UA: NEGATIVE
Glucose, UA: NEGATIVE
Ketones, UA: NEGATIVE
Leukocytes,UA: NEGATIVE
Nitrite, UA: NEGATIVE
Protein,UA: NEGATIVE
RBC, UA: NEGATIVE
Specific Gravity, UA: 1.022 (ref 1.005–1.030)
Urobilinogen, Ur: 0.2 mg/dL (ref 0.2–1.0)
pH, UA: 5 (ref 5.0–7.5)

## 2019-03-28 LAB — URINE CYTOLOGY ANCILLARY ONLY
Chlamydia: NEGATIVE
Neisseria Gonorrhea: NEGATIVE
Trichomonas: NEGATIVE

## 2019-03-28 LAB — HIV ANTIBODY (ROUTINE TESTING W REFLEX): HIV Screen 4th Generation wRfx: NONREACTIVE

## 2019-03-29 LAB — URINE CULTURE: Organism ID, Bacteria: NO GROWTH

## 2019-03-30 NOTE — Progress Notes (Signed)
Internal Medicine Clinic Attending  Case discussed with Dr. Chundi at the time of the visit.  We reviewed the resident's history and exam and pertinent patient test results.  I agree with the assessment, diagnosis, and plan of care documented in the resident's note. 

## 2019-03-30 NOTE — Addendum Note (Signed)
Addended by: Lars Mage on: 03/30/2019 09:14 AM   Modules accepted: Level of Service

## 2019-03-31 LAB — URINE CYTOLOGY ANCILLARY ONLY
Bacterial vaginitis: NEGATIVE
Candida vaginitis: NEGATIVE

## 2019-05-07 ENCOUNTER — Ambulatory Visit (HOSPITAL_COMMUNITY)
Admission: EM | Admit: 2019-05-07 | Discharge: 2019-05-07 | Disposition: A | Discharge status: Home / Self Care | Payer: 59 | Attending: Family Medicine | Admitting: Family Medicine

## 2019-05-07 ENCOUNTER — Other Ambulatory Visit: Payer: Self-pay

## 2019-05-07 ENCOUNTER — Encounter (HOSPITAL_COMMUNITY): Payer: Self-pay | Admitting: Family Medicine

## 2019-05-07 DIAGNOSIS — J0110 Acute frontal sinusitis, unspecified: Secondary | ICD-10-CM | POA: Diagnosis not present

## 2019-05-07 MED ORDER — FLUCONAZOLE 150 MG PO TABS: 150.0000 mg | ORAL_TABLET | Freq: Every day | ORAL | 0 refills | Status: DC

## 2019-05-07 MED ORDER — AMOXICILLIN 500 MG PO CAPS: 1000.0000 mg | ORAL_CAPSULE | Freq: Three times a day (TID) | ORAL | 0 refills | Status: AC

## 2019-05-07 NOTE — ED Provider Notes (Signed)
[facility]    CSN: [id number] Arrival date & time: 05/07/19  0844      History   Chief Complaint Chief Complaint  Patient presents with  . Facial Pain    HPI [name] is a 53 y.o. female.   Patient is a 53 year old female past medical history of anemia, BPPV, diverticulitis, diverticulosis, GERD, hypertension, PVC.  She presents today approximately 5 to 6 days of worsening sinus pressure, sinus headache, dental pain, pain behind the eyes and rhinorrhea.  Symptoms have been constant and worsening.  She took 2 days of Zyrtec and then started taking Sudafed with some relief.  But the pressure in her face is worsening.  Denies any associated fever, chills, cough, congestion, sore throat.  Mild left ear discomfort.  ROS per HPI      Past Medical History:  Diagnosis Date  . Anemia    hx of prior to hysterectomy  . BPPV (benign paroxysmal positional vertigo) 05/07/11   Described as room spinning, notes 2 episodes prior to Oct 2012.  Plans to undergo vestibular rehab.  . Diverticulitis    with microperforation  . Diverticulosis   . Ear ache 03/12/2013  . GERD (gastroesophageal reflux disease)   . Hyperplastic colon polyp   . Hypertension 05/07/11   with grade 2 diastolic dysfunction not on meds x 3 years   . PVC (premature ventricular contraction) 05/07/11   incidentally seen on telemetry    Patient Active Problem List   Diagnosis Date Noted  . Polyuria 03/27/2019  . Recurrent diverticulitis s/p robotic sigmoid colectomy 12/07/2016 12/07/2016  . Preventative health care 08/04/2014  . Menopausal symptoms 09/18/2013  . Obesity (BMI 30-39.9) 03/12/2013  . GERD (gastroesophageal reflux disease) 05/24/2011  . History of hypertension 05/07/2011  . PVC (premature ventricular contraction) 05/07/2011    Past Surgical History:  Procedure Laterality Date  . CERVICAL BIOPSY  W/ LOOP ELECTRODE EXCISION    . CHOLECYSTECTOMY    . COLONOSCOPY    . LAPAROSCOPIC  CHOLECYSTECTOMY SINGLE SITE WITH INTRAOPERATIVE CHOLANGIOGRAM N/A 05/27/2015   Procedure: LAPAROSCOPIC CHOLECYSTECTOMY SINGLE SITE WITH INTRAOPERATIVE CHOLANGIOGRAM;  Surgeon: Michael Boston, MD;  Location: [facility];  Service: General;  Laterality: N/A;  . PROCTOSCOPY N/A 12/07/2016   Procedure: RIGID PROCTOSCOPY;  Surgeon: Michael Boston, MD;  Location: [facility];  Service: General;  Laterality: N/A;  . TONSILLECTOMY    . UMBILICAL HERNIA REPAIR     Approximately 1974  . VAGINAL HYSTERECTOMY  07/2010    OB History   No obstetric history on file.      Home Medications    Prior to Admission medications   Medication Sig Start Date End Date Taking? Authorizing Provider  amoxicillin (AMOXIL) 500 MG capsule Take 2 capsules (1,000 mg total) by mouth 3 (three) times daily for 5 days. 05/07/19 05/12/19  Loura Halt A, NP  famotidine (PEPCID) 20 MG tablet Take 20 mg by mouth daily as needed for heartburn or indigestion.    [provider]  fluconazole (DIFLUCAN) 150 MG tablet Take 1 tablet (150 mg total) by mouth daily. 05/07/19   Loura Halt A, NP  ibuprofen (ADVIL,MOTRIN) 200 MG tablet Take 400 mg by mouth daily as needed for headache or moderate pain.    [provider]  Multiple Vitamin (MULITIVITAMIN WITH MINERALS) TABS Take 1 tablet by mouth daily.    [provider]  Probiotic CAPS Take 1 capsule by mouth daily.    [provider]    [facility]  History Family History  Problem Relation Age of Onset  . Diabetes Mother   . Breast cancer Mother   . Diabetes Maternal Grandmother   . Cervical cancer Maternal Aunt   . Deep vein thrombosis Father        died from cerebral embolus  . Colon cancer Brother 64  . Esophageal cancer Neg Hx   . Rectal cancer Neg Hx   . Stomach cancer Neg Hx     Social History Social History   Tobacco Use  . Smoking status: Former Smoker    Packs/day: 0.10    Years: 20.00    Pack years: 2.00    Types: Cigarettes    Quit date:  07/09/2005    Years since quitting: 13.8  . Smokeless tobacco: Never Used  . Tobacco comment: 1 black & mild a week  Substance Use Topics  . Alcohol use: Yes    Comment: Glass of wine once every few months   . Drug use: No     Allergies   Lisinopril, Morphine and related, and Latex   Review of Systems Review of Systems   Physical Exam Triage Vital Signs ED Triage Vitals  Enc Vitals Group     BP 05/07/19 0932 (!) 146/90     Pulse Rate 05/07/19 0932 71     Resp 05/07/19 0932 16     Temp 05/07/19 0932 98.2 F (36.8 C)     Temp Source 05/07/19 0932 Temporal     SpO2 05/07/19 0932 97 %     Weight --      Height --      Head Circumference --      Peak Flow --      Pain Score 05/07/19 1023 0     Pain Loc --      Pain Edu? --      Excl. in [place]? --    No data found.  Updated Vital Signs BP (!) 146/90 (BP Location: Left Arm)   Pulse 71   Temp 98.2 F (36.8 C) (Temporal)   Resp 16   SpO2 97%   Visual Acuity Right Eye Distance:   Left Eye Distance:   Bilateral Distance:    Right Eye Near:   Left Eye Near:    Bilateral Near:     Physical Exam Vitals signs and nursing note reviewed.  Constitutional:      General: She is not in acute distress.    Appearance: Normal appearance. She is not ill-appearing, toxic-appearing or diaphoretic.  HENT:     Head: Normocephalic and atraumatic.     Right Ear: Tympanic membrane and ear canal normal.     Left Ear: Tympanic membrane and ear canal normal.     Nose: Mucosal edema, congestion and rhinorrhea present.     Right Turbinates: Enlarged and swollen.     Left Turbinates: Enlarged and swollen.     Right Sinus: Maxillary sinus tenderness and frontal sinus tenderness present.     Left Sinus: Maxillary sinus tenderness and frontal sinus tenderness present.     Mouth/Throat:     Pharynx: Oropharynx is clear.  Eyes:     Conjunctiva/sclera: Conjunctivae normal.  Neck:     Musculoskeletal: Normal range of motion.  Pulmonary:      Effort: Pulmonary effort is normal.  Musculoskeletal: Normal range of motion.  Skin:    General: Skin is warm and dry.  Neurological:     Mental Status: She is alert.  Psychiatric:  Mood and Affect: Mood normal.      UC Treatments / Results  Labs (all labs ordered are listed, but only abnormal results are displayed) Labs Reviewed - No data to display  EKG   Radiology No results found.  Procedures Procedures (including critical care time)  Medications Ordered in UC Medications - No data to display  Initial Impression / Assessment and Plan / UC Course  I have reviewed the triage vital signs and the nursing notes.  Pertinent labs & imaging results that were available during my care of the patient were reviewed by me and considered in my medical decision making (see chart for details).     Sinusitis- treating for possible sinus infection with amoxicillin. Recommended probiotic.  Diflucan in case she gets a yeast infection.  Zyrtec D and ibuprofen  Pt opted to not do a course of steroids and does not like nasal sprays.  Follow up as needed for continued or worsening symptoms  Final Clinical Impressions(s) / UC Diagnoses   Final diagnoses:  Acute non-recurrent frontal sinusitis     Discharge Instructions     Treating you for a sinus infection Take the medication as prescribed Follow up as needed for continued or worsening symptoms     ED Prescriptions    Medication Sig Dispense Auth. Provider   amoxicillin (AMOXIL) 500 MG capsule Take 2 capsules (1,000 mg total) by mouth 3 (three) times daily for 5 days. 30 capsule Izabellah Dadisman A, NP   fluconazole (DIFLUCAN) 150 MG tablet Take 1 tablet (150 mg total) by mouth daily. 2 tablet Loura Halt A, NP     PDMP not reviewed this encounter.   Orvan July, NP 05/07/19 1132

## 2019-05-07 NOTE — Discharge Instructions (Addendum)
Treating you for a sinus infection.  Take the medication as prescribed. Follow up as needed for continued or worsening symptoms  

## 2020-01-19 ENCOUNTER — Other Ambulatory Visit: Payer: Self-pay | Admitting: Internal Medicine

## 2020-01-19 DIAGNOSIS — Z1231 Encounter for screening mammogram for malignant neoplasm of breast: Secondary | ICD-10-CM

## 2020-01-27 ENCOUNTER — Other Ambulatory Visit: Payer: Self-pay | Admitting: Internal Medicine

## 2020-01-27 DIAGNOSIS — Z1231 Encounter for screening mammogram for malignant neoplasm of breast: Secondary | ICD-10-CM

## 2020-02-01 ENCOUNTER — Other Ambulatory Visit: Payer: Self-pay | Admitting: Internal Medicine

## 2020-02-01 DIAGNOSIS — Z1231 Encounter for screening mammogram for malignant neoplasm of breast: Secondary | ICD-10-CM

## 2020-02-04 ENCOUNTER — Other Ambulatory Visit: Payer: Self-pay

## 2020-02-04 ENCOUNTER — Ambulatory Visit
Admission: RE | Admit: 2020-02-04 | Discharge: 2020-02-04 | Disposition: A | Discharge status: Home / Self Care | Payer: 59 | Source: Ambulatory Visit

## 2020-02-04 DIAGNOSIS — Z1231 Encounter for screening mammogram for malignant neoplasm of breast: Secondary | ICD-10-CM

## 2020-05-19 ENCOUNTER — Encounter: Payer: Self-pay | Admitting: Physician Assistant

## 2020-05-19 ENCOUNTER — Ambulatory Visit (INDEPENDENT_AMBULATORY_CARE_PROVIDER_SITE_OTHER): Payer: 59 | Admitting: Physician Assistant

## 2020-05-19 VITALS — BP 132/72 | HR 78 | Ht 66.0 in | Wt 236.6 lb

## 2020-05-19 DIAGNOSIS — Z800 Family history of malignant neoplasm of digestive organs: Secondary | ICD-10-CM

## 2020-05-19 DIAGNOSIS — Z86010 Personal history of colonic polyps: Secondary | ICD-10-CM

## 2020-05-19 DIAGNOSIS — K648 Other hemorrhoids: Secondary | ICD-10-CM

## 2020-05-19 MED ORDER — HYDROCORTISONE ACETATE 25 MG RE SUPP: 25.0000 mg | Freq: Every evening | RECTAL | 1 refills | Status: AC

## 2020-05-19 NOTE — Patient Instructions (Addendum)
If you are age 55 or older, your body mass index should be between 23-30. Your Body mass index is 38.19 kg/m. If this is out of the aforementioned range listed, please consider follow up with your Primary Care Provider.  If you are age 72 or younger, your body mass index should be between 19-25. Your Body mass index is 38.19 kg/m. If this is out of the aformentioned range listed, please consider follow up with your Primary Care Provider.   START Anusol suppositories 1 suppository at bedtime for 7 nights as needed for hemorrhoidal bleeding. You may repeat as needed.  You have been scheduled for your first hemorrhoid banding with Dr. Hilarie Fredrickson on July 19, 2020 at 3:40 pm

## 2020-05-19 NOTE — Progress Notes (Signed)
Subjective:    [name], female    DOB: [birth year], 54 y.o.   MRN: [id number]  HPI [name] is a pleasant 54 year old African-American female, established with Dr. Hilarie Barnes who comes in today to discuss hemorrhoidal symptoms and banding.  She has history of GERD and diverticulitis and is status post sigmoid colectomy in 2018 per Dr. Johney Barnes for diverticular disease.  She had undergone colonoscopy in April 2018 with finding of medium sized internal hemorrhoids, multiple diverticuli and had a 3 mm polyp in the ascending colon which was a tubular adenoma. She does have family history of colon cancer in her brother and is therefore indicated for 5-year interval follow-up. Patient says she has had fairly long-term symptoms of occasional small-volume hematochezia noted just on the tissue but over the past 4 to 5 months has gotten to the point of having episodes of rectal bleeding at least 2-3 times per week.  This occurs with bowel movements and is noted on the tissue and with blood in the commode.  She sometimes gets some dripping of blood into the commode.  She says she has had episodes that seem like a lot more blood and has noticed some very small clots at times. She has no complaints of abdominal pain or discomfort, no rectal pain or discomfort, no constipation or excessive straining and no diarrhea. She feels that a hemorrhoid may protrude out intermittently, she has not been using any over-the-counter products for the hemorrhoids.  Review of Systems Pertinent positive and negative review of systems were noted in the above HPI section.  All other review of systems was otherwise negative.  Outpatient Encounter Medications as of 05/19/2020  Medication Sig  . famotidine (PEPCID) 20 MG tablet Take 20 mg by mouth daily as needed for heartburn or indigestion. As needed  . ibuprofen (ADVIL,MOTRIN) 200 MG tablet Take 400 mg by mouth daily as needed for headache or moderate pain.  .  Multiple Vitamin (MULITIVITAMIN WITH MINERALS) TABS Take 1 tablet by mouth daily.  . Probiotic CAPS Take 1 capsule by mouth daily.  . hydrocortisone (ANUSOL-HC) 25 MG suppository Place 1 suppository (25 mg total) rectally at bedtime for 7 days.  . [DISCONTINUED] fluconazole (DIFLUCAN) 150 MG tablet Take 1 tablet (150 mg total) by mouth daily.   No facility-administered encounter medications on file as of 05/19/2020.   Allergies  Allergen Reactions  . Lisinopril Cough  . Morphine And Related Nausea And Vomiting  . Latex Itching and Rash   Patient Active Problem List   Diagnosis Date Noted  . Polyuria 03/27/2019  . Recurrent diverticulitis s/p robotic sigmoid colectomy 12/07/2016 12/07/2016  . Preventative health care 08/04/2014  . Menopausal symptoms 09/18/2013  . Obesity (BMI 30-39.9) 03/12/2013  . GERD (gastroesophageal reflux disease) 05/24/2011  . History of hypertension 05/07/2011  . PVC (premature ventricular contraction) 05/07/2011   Social History   Socioeconomic History  . Marital status: Single    Spouse name: Not on file  . Number of children: 2  . Years of education: Not on file  . Highest education level: Not on file  Occupational History    Employer: [employer]  Tobacco Use  . Smoking status: Former Smoker    Packs/day: 0.10    Years: 20.00    Pack years: 2.00    Types: Cigarettes    Quit date: 07/09/2005    Years since quitting: 14.8  . Smokeless tobacco: Never Used  . Tobacco comment: 1 black & mild a  week  Vaping Use  . Vaping Use: Never used  Substance and Sexual Activity  . Alcohol use: Yes    Comment: Glass of wine once every few months   . Drug use: No  . Sexual activity: Yes    Birth control/protection: None  Other Topics Concern  . Not on file  Social History Narrative   Has two children and one granddaughter. Lives by herself.   Social Determinants of Health   Financial Resource Strain:   . Difficulty of Paying Living Expenses: Not  on file  Food Insecurity:   . Worried About Charity fundraiser in the Last Year: Not on file  . Ran Out of Food in the Last Year: Not on file  Transportation Needs:   . Lack of Transportation (Medical): Not on file  . Lack of Transportation (Non-Medical): Not on file  Physical Activity:   . Days of Exercise per Week: Not on file  . Minutes of Exercise per Session: Not on file  Stress:   . Feeling of Stress : Not on file  Social Connections:   . Frequency of Communication with Friends and Family: Not on file  . Frequency of Social Gatherings with Friends and Family: Not on file  . Attends Religious Services: Not on file  . Active Member of Clubs or Organizations: Not on file  . Attends Archivist Meetings: Not on file  . Marital Status: Not on file  Intimate Partner Violence:   . Fear of Current or Ex-Partner: Not on file  . Emotionally Abused: Not on file  . Physically Abused: Not on file  . Sexually Abused: Not on file    [name]'s family history includes Breast cancer in her mother; Cervical cancer in her maternal aunt; Colon cancer (age of onset: 52) in her brother; Deep vein thrombosis in her father; Diabetes in her maternal grandmother and mother.      Objective:    Vitals:   05/19/20 0959  BP: 132/72  Pulse: 78  SpO2: 97%    Physical Exam Well-developed well-nourished AA femalein no acute distress.  Height, [id number] BMI38  HEENT; nontraumatic normocephalic, EOMI, PE RR LA, sclera anicteric.  Skin; benign exam, no jaundice rash or appreciable lesions Extremities; no clubbing cyanosis or edema skin warm and dry Neuro/Psych; alert and oriented x4, grossly nonfocal mood and affect appropriate       Assessment & Plan:   #44 54 year old African-American female with previously documented medium sized internal hemorrhoids who has developed frequent episodes of rectal bleeding now occurring 2-3 times per week with bowel movements with blood on the  tissue and in the commode over the past 4 to 5 months. No associated anorectal discomfort, no abdominal pain, no changes in bowel habits constipation straining etc.  #2 personal history of adenomatous colon polyp-last colonoscopy April 2018 3.  Family history of colon cancer in patient's brother-indicated for 5-year interval follow-up which will be April 2023 4.  Prior history of diverticulitis and status post sigmoid colectomy 2018 #5 GERD  Plan; Discussed options for management with medication versus proceeding with more definitive treatment of the internal hemorrhoids with an office hemorrhoidal banding.  She would like to proceed with banding.  Procedure was discussed in detail with the patient including indications risks and benefits and she is agreeable to proceed.  This will be scheduled with Dr. Hilarie Barnes. In the interim start Anusol HC suppositories nightly x7 nights during symptoms with rectal bleeding then repeat as needed.  Plan repeat colonoscopy April 2023  Alfredia Ferguson PA-C 05/19/2020   Cc: Mitzi Hansen, MD

## 2020-05-23 NOTE — Progress Notes (Signed)
Addendum: Reviewed and agree with assessment and management plan. Domnique Vantine M, MD  

## 2020-07-06 ENCOUNTER — Encounter: Payer: Self-pay | Admitting: *Deleted

## 2020-07-19 ENCOUNTER — Encounter: Payer: 59 | Admitting: Internal Medicine

## 2021-01-10 ENCOUNTER — Encounter: Payer: Self-pay | Admitting: *Deleted

## 2021-01-20 ENCOUNTER — Other Ambulatory Visit: Payer: Self-pay | Admitting: Pediatrics

## 2021-01-23 ENCOUNTER — Other Ambulatory Visit: Payer: Self-pay | Admitting: Internal Medicine

## 2021-01-23 ENCOUNTER — Other Ambulatory Visit: Payer: Self-pay | Admitting: Student

## 2021-01-23 DIAGNOSIS — Z1231 Encounter for screening mammogram for malignant neoplasm of breast: Secondary | ICD-10-CM

## 2021-02-07 ENCOUNTER — Ambulatory Visit
Admission: RE | Admit: 2021-02-07 | Discharge: 2021-02-07 | Disposition: A | Discharge status: Home / Self Care | Payer: 59 | Source: Ambulatory Visit

## 2021-02-07 ENCOUNTER — Other Ambulatory Visit: Payer: Self-pay

## 2021-02-07 DIAGNOSIS — Z1231 Encounter for screening mammogram for malignant neoplasm of breast: Secondary | ICD-10-CM

## 2021-02-23 ENCOUNTER — Encounter (HOSPITAL_COMMUNITY): Payer: Self-pay | Admitting: Emergency Medicine

## 2021-02-23 ENCOUNTER — Other Ambulatory Visit: Payer: Self-pay

## 2021-02-23 ENCOUNTER — Ambulatory Visit (HOSPITAL_COMMUNITY)
Admission: EM | Admit: 2021-02-23 | Discharge: 2021-02-23 | Disposition: A | Discharge status: Home / Self Care | Payer: 59 | Attending: Physician Assistant | Admitting: Physician Assistant

## 2021-02-23 DIAGNOSIS — H109 Unspecified conjunctivitis: Secondary | ICD-10-CM | POA: Diagnosis not present

## 2021-02-23 MED ORDER — ERYTHROMYCIN 5 MG/GM OP OINT: TOPICAL_OINTMENT | OPHTHALMIC | 0 refills | Status: DC

## 2021-02-23 MED ORDER — TETRACAINE HCL 0.5 % OP SOLN
OPHTHALMIC | Status: AC
  Filled 2021-02-23: qty 4

## 2021-02-23 NOTE — Discharge Instructions (Signed)
Do not wear contacts until your symptoms resolve.  Use erythromycin ointment twice daily for at least a few days then decrease to once daily for at least 1 week.  You can use lubricating eyedrops but avoid anything that has redness relief.  Rest your eye and avoid exposure to bright lights.  Proving after several days of the medication please return for reevaluation.  If you have any severe symptoms including changes to your vision, increased pain, persistent redness you need to be seen immediately.

## 2021-02-23 NOTE — ED Provider Notes (Signed)
[place]    CSN: [id number] Arrival date & time: 02/23/21  0930      History   Chief Complaint Chief Complaint  Patient presents with   Eye Drainage    HPI [name] is a 55 y.o. female.   Patient presents today with a several day history of drainage and irritation of her left eye.  Reports that symptoms began after she used an old bottle of mascara.  She reports drainage has become more persistent and thicker and this morning she had difficulty opening her eye because of the amount of drainage.  She does report an irritated feeling of the eye but denies any ocular pain.  She denies any vision changes.  Denies additional symptoms including headache, dizziness, nausea, vomiting.  She has not tried any over-the-counter medication for symptom management.  She wears glasses but has not worn contacts in many years.  She denies any foreign body sensation.   Past Medical History:  Diagnosis Date   Anemia    hx of prior to hysterectomy   BPPV (benign paroxysmal positional vertigo) 05/07/11   Described as room spinning, notes 2 episodes prior to Oct 2012.  Plans to undergo vestibular rehab.   Diverticulitis    with microperforation   Diverticulosis    Ear ache 03/12/2013   GERD (gastroesophageal reflux disease)    Hyperplastic colon polyp    Hypertension 05/07/11   with grade 2 diastolic dysfunction not on meds x 3 years    Internal hemorrhoids    PVC (premature ventricular contraction) 05/07/11   incidentally seen on telemetry   Tubular adenoma of colon     Patient Active Problem List   Diagnosis Date Noted   Polyuria 03/27/2019   Recurrent diverticulitis s/p robotic sigmoid colectomy 12/07/2016 12/07/2016   Preventative health care 08/04/2014   Menopausal symptoms 09/18/2013   Obesity (BMI 30-39.9) 03/12/2013   GERD (gastroesophageal reflux disease) 05/24/2011   History of hypertension 05/07/2011   PVC (premature ventricular contraction) 05/07/2011     Past Surgical History:  Procedure Laterality Date   CERVICAL BIOPSY  W/ LOOP ELECTRODE EXCISION     CHOLECYSTECTOMY     COLONOSCOPY     LAPAROSCOPIC CHOLECYSTECTOMY SINGLE SITE WITH INTRAOPERATIVE CHOLANGIOGRAM N/A 05/27/2015   Procedure: LAPAROSCOPIC CHOLECYSTECTOMY SINGLE SITE WITH INTRAOPERATIVE CHOLANGIOGRAM;  Surgeon: Michael Boston, MD;  Location: [facility];  Service: General;  Laterality: N/A;   PROCTOSCOPY N/A 12/07/2016   Procedure: RIGID PROCTOSCOPY;  Surgeon: Michael Boston, MD;  Location: [facility];  Service: General;  Laterality: N/A;   TONSILLECTOMY     UMBILICAL HERNIA REPAIR     Approximately [place]  07/2010    OB History   No obstetric history on file.      Home Medications    Prior to Admission medications   Medication Sig Start Date End Date Taking? Authorizing Provider  erythromycin ophthalmic ointment Place a 1/2 inch ribbon of ointment into the lower eyelid of left eye twice a day for one week 02/23/21  Yes Monnica Saltsman K, PA-C  famotidine (PEPCID) 20 MG tablet Take 20 mg by mouth daily as needed for heartburn or indigestion. As needed    [provider]  ibuprofen (ADVIL,MOTRIN) 200 MG tablet Take 400 mg by mouth daily as needed for headache or moderate pain.    [provider]  Multiple Vitamin (MULITIVITAMIN WITH MINERALS) TABS Take 1 tablet by mouth daily.    [provider]  Probiotic  CAPS Take 1 capsule by mouth daily.    [provider]    Family History Family History  Problem Relation Age of Onset   Diabetes Mother    Breast cancer Mother    Diabetes Maternal Grandmother    Cervical cancer Maternal Aunt    Deep vein thrombosis Father        died from cerebral embolus   Colon cancer Brother 74   Esophageal cancer Neg Hx    Rectal cancer Neg Hx    Stomach cancer Neg Hx     Social History Social History   Tobacco Use   Smoking status: Former    Packs/day: 0.10    Years: 20.00    Pack  years: 2.00    Types: Cigarettes    Quit date: 07/09/2005    Years since quitting: 15.6   Smokeless tobacco: Never   Tobacco comments:    1 black & mild a week  Vaping Use   Vaping Use: Never used  Substance Use Topics   Alcohol use: Yes    Comment: Glass of wine once every few months    Drug use: No     Allergies   Lisinopril, Morphine and related, and Latex   Review of Systems Review of Systems  Constitutional:  Negative for activity change, appetite change, fatigue and fever.  HENT:  Negative for congestion, sinus pressure, sneezing and sore throat.   Eyes:  Positive for photophobia, discharge, redness and itching. Negative for pain and visual disturbance.  Gastrointestinal:  Negative for abdominal pain, diarrhea, nausea and vomiting.  Neurological:  Negative for dizziness, light-headedness and headaches.    Physical Exam Triage Vital Signs ED Triage Vitals  Enc Vitals Group     BP 02/23/21 1040 133/87     Pulse Rate 02/23/21 1040 (!) 101     Resp 02/23/21 1040 18     Temp 02/23/21 1040 98.7 F (37.1 C)     Temp Source 02/23/21 1040 Oral     SpO2 02/23/21 1040 98 %     Weight --      Height --      Head Circumference --      Peak Flow --      Pain Score 02/23/21 1037 5     Pain Loc --      Pain Edu? --      Excl. in [place]? --    No data found.  Updated Vital Signs BP 133/87   Pulse (!) 101   Temp 98.7 F (37.1 C) (Oral)   Resp 18   SpO2 98%   Visual Acuity Right Eye Distance: 20/20 Left Eye Distance: 20/30 Bilateral Distance: 20/20  Right Eye Near:   Left Eye Near:    Bilateral Near:     Physical Exam Vitals reviewed.  Constitutional:      General: She is awake. She is not in acute distress.    Appearance: Normal appearance. She is normal weight. She is not ill-appearing.     Comments: Very pleasant female appears stated age in no acute distress sitting comfortably in exam room  HENT:     Head: Normocephalic and atraumatic.  Eyes:      General: Lids are normal.     Extraocular Movements: Extraocular movements intact.     Conjunctiva/sclera:     Left eye: Left conjunctiva is injected. No hemorrhage.    Pupils: Pupils are equal, round, and reactive to light.     Left eye: No  corneal abrasion or fluorescein uptake.  Cardiovascular:     Rate and Rhythm: Normal rate and regular rhythm.     Heart sounds: Normal heart sounds, S1 normal and S2 normal. No murmur heard. Pulmonary:     Effort: Pulmonary effort is normal.     Breath sounds: Normal breath sounds. No wheezing, rhonchi or rales.     Comments: Clear to auscultation bilaterally Abdominal:     General: Bowel sounds are normal.     Palpations: Abdomen is soft.     Tenderness: There is no abdominal tenderness. There is no right CVA tenderness, left CVA tenderness, guarding or rebound.  Psychiatric:        Behavior: Behavior is cooperative.     UC Treatments / Results  Labs (all labs ordered are listed, but only abnormal results are displayed) Labs Reviewed - No data to display  EKG   Radiology No results found.  Procedures Procedures (including critical care time)  Medications Ordered in UC Medications - No data to display  Initial Impression / Assessment and Plan / UC Course  I have reviewed the triage vital signs and the nursing notes.  Pertinent labs & imaging results that were available during my care of the patient were reviewed by me and considered in my medical decision making (see chart for details).      Symptoms consistent with bacterial conjunctivitis given unilateral presentation with significant drainage in the morning.  Discussed that patient should replace mascara as this is likely the cause of symptoms.  She was started on erythromycin ointment to be used twice daily for at least several days then decrease to once daily for at least a total of 1 week.  She does not currently wear contacts but was instructed not to begin wearing contacts  until symptoms resolve.  She can use lubricating eyedrops for additional symptom relief but was discouraged from using anything with vasoconstrictors.  Discussed that if symptoms or not improving she should follow-up with ophthalmologist; she is already established with eye doctor we will contact them if symptoms persist.  Discussed alarm symptoms that warrant emergent evaluation.  Strict return precautions given to which patient expressed understanding.  Final Clinical Impressions(s) / UC Diagnoses   Final diagnoses:  Bacterial conjunctivitis of left eye     Discharge Instructions      Do not wear contacts until your symptoms resolve.  Use erythromycin ointment twice daily for at least a few days then decrease to once daily for at least 1 week.  You can use lubricating eyedrops but avoid anything that has redness relief.  Rest your eye and avoid exposure to bright lights.  Proving after several days of the medication please return for reevaluation.  If you have any severe symptoms including changes to your vision, increased pain, persistent redness you need to be seen immediately.     ED Prescriptions     Medication Sig Dispense Auth. Provider   erythromycin ophthalmic ointment Place a 1/2 inch ribbon of ointment into the lower eyelid of left eye twice a day for one week 3.5 g Freedom Peddy K, PA-C      PDMP not reviewed this encounter.   Terrilee Croak, PA-C 02/23/21 1110

## 2021-02-23 NOTE — ED Triage Notes (Addendum)
Pt is present today with left eye irritation. Pt states that she used mascara on Sunday and might have gotten some in her eye and states that she has had irritation every since then. Pt states this morning she woke up with eye drainage and crust surrounding her eye

## 2021-04-20 ENCOUNTER — Encounter: Payer: Self-pay | Admitting: Internal Medicine

## 2021-04-20 ENCOUNTER — Ambulatory Visit (INDEPENDENT_AMBULATORY_CARE_PROVIDER_SITE_OTHER): Payer: 59 | Admitting: Internal Medicine

## 2021-04-20 VITALS — BP 142/87 | HR 104 | Wt 229.6 lb

## 2021-04-20 DIAGNOSIS — E669 Obesity, unspecified: Secondary | ICD-10-CM | POA: Diagnosis not present

## 2021-04-20 DIAGNOSIS — Z0000 Encounter for general adult medical examination without abnormal findings: Secondary | ICD-10-CM | POA: Diagnosis not present

## 2021-04-20 DIAGNOSIS — I10 Essential (primary) hypertension: Secondary | ICD-10-CM | POA: Diagnosis not present

## 2021-04-20 DIAGNOSIS — N951 Menopausal and female climacteric states: Secondary | ICD-10-CM

## 2021-04-20 LAB — POCT GLYCOSYLATED HEMOGLOBIN (HGB A1C): Hemoglobin A1C: 5.6 % (ref 4.0–5.6)

## 2021-04-20 LAB — GLUCOSE, CAPILLARY: Glucose-Capillary: 134 mg/dL — ABNORMAL HIGH (ref 70–99)

## 2021-04-20 MED ORDER — HYDROCHLOROTHIAZIDE 12.5 MG PO CAPS: 12.5000 mg | ORAL_CAPSULE | Freq: Every day | ORAL | 11 refills | Status: DC

## 2021-04-20 NOTE — Progress Notes (Signed)
   CC: High blood pressure  HPI:  [name] is a 55 y.o. PMH noted below, who presents to the [facility] with complaints of high blood pressure. To see the management of his acute and chronic conditions, please refer to the A&P note under the encounters tab.   Past Medical History:  Diagnosis Date   Anemia    hx of prior to hysterectomy   BPPV (benign paroxysmal positional vertigo) 05/07/11   Described as room spinning, notes 2 episodes prior to Oct 2012.  Plans to undergo vestibular rehab.   Diverticulitis    with microperforation   Diverticulosis    Ear ache 03/12/2013   GERD (gastroesophageal reflux disease)    Hyperplastic colon polyp    Hypertension 05/07/11   with grade 2 diastolic dysfunction not on meds x 3 years    Internal hemorrhoids    PVC (premature ventricular contraction) 05/07/11   incidentally seen on telemetry   Tubular adenoma of colon    Review of Systems:  positive for headaches, difficulty sleeping  Physical Exam: Gen: WNWD female in NAD HEENT: normocephalic atraumatic, MMM, neck supple, sclerae anicteric CV: RRR, no m/r/g   Resp: CTAB, normal WOB  GI: soft, nontender MSK: moves all extremities without difficulty, normal gait Skin:warm and dry Neuro:alert answering questions appropriately Psych: normal affect   Assessment & Plan:   See Encounters Tab for problem based charting.  Patient seen with Dr. Evette Doffing

## 2021-04-20 NOTE — Assessment & Plan Note (Signed)
Colonoscopy recommended five years out from last, not due until 2023. Patient declined flu shot today. No need for cervical cancer screening as patient denies history of abnormal paps and has had a total hysterectomy.

## 2021-04-20 NOTE — Assessment & Plan Note (Addendum)
Last A1c checked several years ago was 5.6, 5.6 again today on recheck

## 2021-04-20 NOTE — Assessment & Plan Note (Signed)
Discussed hot flash symptoms, patient feels like she knows her triggers now and is better able to manage them. SSRIs may be an option in the future but she does not currently feel like she needs treatment.

## 2021-04-20 NOTE — Patient Instructions (Addendum)
[name]  It was a pleasure seeing you in the clinic today.   We talked about your trouble sleeping and your high blood pressure  High blood pressure Restart HCTZ 12.5 mg daily. We will check on your blood pressure in about a month to see how you are doing  2. Blood sugar I checked some blood work today and will let you know if anything comes back abnormal.  Please come back and see [place] in clinic in about 4-6 weeks  Please call our clinic at [phone] if you have any questions or concerns. The best time to call is Monday-Friday from 9am-4pm, but there is someone available 24/7 at the same number. If you need medication refills, please notify your pharmacy one week in advance and they will send [place] a request.   Thank you for letting us take part in your care. We look forward to seeing you next time!

## 2021-04-20 NOTE — Assessment & Plan Note (Signed)
Patient previously took HCTZ for high blood pressure but was able to lose about 20 lbs and stop this medication for a while. However, she has gained some of the weight back and her blood pressure has increased again. In clinic her BP was 142/87. At home when she checks it her diastolic numbers have been running in the 90s-100s.  - restart HCTZ 12.5 - check BMP in one month

## 2021-04-21 NOTE — Progress Notes (Signed)
Internal Medicine Clinic Attending  I saw and evaluated the patient.  I personally confirmed the key portions of the history and exam documented by Dr. DeMaio   and I reviewed pertinent patient test results.  The assessment, diagnosis, and plan were formulated together and I agree with the documentation in the resident's note.  

## 2021-07-30 NOTE — Progress Notes (Signed)
° °  Office Visit   [name], female    DOB: 1965/09/06, 56 y.o.   MRN: [id number]   PCP: Mitzi Hansen, MD   Subjective:   [name] is a 56 y.o. year old female who presents for blood pressure follow up.   LOV was in October. Blood pressure was 142/87 however she had noted that she had stopped taking HCTZ. This was restarted and she was instructed to follow up in 4w.   She reports adherence to hctz since her LOV. Denies adverse medication effects.   She continues to experience hot flashes but is not interested in pursuing any pharmaceutical management of this.  She wished to visit about weight loss. She has been working on exercising via biking, several times per week, in addition to diet monitoring. She notes she has lost about 2 pounds in the past 2 months. We discussed different between muscle and fat weight. We visited about additional options including medication. She does not like needles and is hesitant about starting a GLP-1.    Objective:   BP 116/74 (BP Location: Right Arm, Patient Position: Sitting, Cuff Size: Large)    Pulse 96    Temp 98.2 F (36.8 C) (Oral)    Resp (!) 24    Ht 5\' 6"  (1.676 m)    Wt 230 lb 12.8 oz (104.7 kg)    SpO2 96% Comment: room air   BMI 37.25 kg/m   General: obese, well appearing, no distress. Cardiac: RRR Pulm: lungs clear throughout  Assessment & Plan:   Problem List Items Addressed This Visit       Cardiovascular and Mediastinum   Essential hypertension - Primary    Current medications: hctz 12.5mg  daily Blood pressure is at goal in the office today--116/74. She reports medication adherence and denies adverse medication effects.  Renal function and potassium look good on BMP today.  Continue current management  Follow up 3 months. If blood pressure and labs are stable at that visit, may extend her to 6 month visits.      Relevant Orders   Basic metabolic panel (Completed)     Other   Obesity (BMI  30-39.9)    Plans for weight loss discussed at length today. See HPI.  Referred to nutrition  At the end of our conversation, she was ambivalent about starting a GLP-1. She wishes to discuss with her daughter, who has diabetes and is one one. She was instructed to notify our office if she wishes to start one. Advised on possibility of issues with insurance coverage.  Encouraged to continue physical activity      Relevant Orders   Referral to Nutrition and Diabetes Services   Menopausal symptoms    Not interested in medical management.      Preventative health care    Denies all vaccinations at today's visit.       Return in about 3 months (around 10/29/2021). Check RFP for calcium, albumin and phos  Pt discussed with Dr. Venetia Maxon, MD Internal Medicine Resident PGY-3 [facility] 08/01/2021 12:12 PM

## 2021-07-31 ENCOUNTER — Other Ambulatory Visit: Payer: Self-pay

## 2021-07-31 ENCOUNTER — Encounter: Payer: Self-pay | Admitting: Internal Medicine

## 2021-07-31 ENCOUNTER — Ambulatory Visit (INDEPENDENT_AMBULATORY_CARE_PROVIDER_SITE_OTHER): Payer: 59 | Admitting: Internal Medicine

## 2021-07-31 VITALS — BP 116/74 | HR 96 | Temp 98.2°F | Resp 24 | Ht 66.0 in | Wt 230.8 lb

## 2021-07-31 DIAGNOSIS — N951 Menopausal and female climacteric states: Secondary | ICD-10-CM

## 2021-07-31 DIAGNOSIS — Z0000 Encounter for general adult medical examination without abnormal findings: Secondary | ICD-10-CM

## 2021-07-31 DIAGNOSIS — I10 Essential (primary) hypertension: Secondary | ICD-10-CM

## 2021-07-31 DIAGNOSIS — E669 Obesity, unspecified: Secondary | ICD-10-CM

## 2021-08-01 HISTORY — DX: Hypercalcemia: E83.52

## 2021-08-01 LAB — BASIC METABOLIC PANEL
BUN/Creatinine Ratio: 20 (ref 9–23)
BUN: 17 mg/dL (ref 6–24)
CO2: 23 mmol/L (ref 20–29)
Calcium: 10.3 mg/dL — ABNORMAL HIGH (ref 8.7–10.2)
Chloride: 102 mmol/L (ref 96–106)
Creatinine, Ser: 0.83 mg/dL (ref 0.57–1.00)
Glucose: 125 mg/dL — ABNORMAL HIGH (ref 70–99)
Potassium: 4.3 mmol/L (ref 3.5–5.2)
Sodium: 140 mmol/L (ref 134–144)
eGFR: 83 mL/min/{1.73_m2} (ref >59)

## 2021-08-01 NOTE — Assessment & Plan Note (Signed)
Not interested in medical management.

## 2021-08-01 NOTE — Assessment & Plan Note (Addendum)
Current medications: hctz 12.5mg  daily Blood pressure is at goal in the office today--116/74. She reports medication adherence and denies adverse medication effects.  Renal function and potassium look good on BMP today.   Continue current management   Follow up 3 months. If blood pressure and labs are stable at that visit, may extend her to 6 month visits.

## 2021-08-01 NOTE — Assessment & Plan Note (Signed)
Plans for weight loss discussed at length today. See HPI.   Referred to nutrition   At the end of our conversation, she was ambivalent about starting a GLP-1. She wishes to discuss with her daughter, who has diabetes and is one one. She was instructed to notify our office if she wishes to start one. Advised on possibility of issues with insurance coverage.   Encouraged to continue physical activity

## 2021-08-01 NOTE — Assessment & Plan Note (Signed)
Denies all vaccinations at today's visit.

## 2021-08-01 NOTE — Assessment & Plan Note (Signed)
Calcium mildly elevated to 10.3 on labs today. No recent albumin level available for correct. She has had borderline elevated calcium levels intermittently over several years. Suspect this is related to hctz use.   Will recheck calcium and add albumin to labs at her next office visit. If stable, I would not plan on pursuing additional workup given her age and having a reasonable explanation for borderline calciums. If corrected calcium is significantly higher, then would plan to check a vitamin D, phos, and PTH.

## 2021-08-03 NOTE — Progress Notes (Signed)
Internal Medicine Clinic Attending  Case discussed with Dr. Christian  At the time of the visit.  We reviewed the resident's history and exam and pertinent patient test results.  I agree with the assessment, diagnosis, and plan of care documented in the resident's note.  

## 2021-09-25 ENCOUNTER — Telehealth: Payer: Self-pay

## 2021-09-25 ENCOUNTER — Encounter: Payer: Self-pay | Admitting: Internal Medicine

## 2021-09-25 NOTE — Telephone Encounter (Signed)
PT  is requesting a call back .. she stated that she took a @ home covid test on Saturday and it came back positive.. she is needing a note for her job   ?

## 2021-09-25 NOTE — Telephone Encounter (Signed)
Return call to pt - stated she needs a letter for her job stating she has COVID. May be address"to whom it may concern". Also requesting it be sent thru her My Chart. ?

## 2021-09-26 ENCOUNTER — Ambulatory Visit (INDEPENDENT_AMBULATORY_CARE_PROVIDER_SITE_OTHER): Payer: 59 | Admitting: Internal Medicine

## 2021-09-26 DIAGNOSIS — U071 COVID-19: Secondary | ICD-10-CM | POA: Diagnosis not present

## 2021-09-26 NOTE — Progress Notes (Signed)
?  [facility] Telephone Encounter ?Continuity Care Appointment ? ?HPI:  ?This telephone encounter was created for [name] on 09/26/2021 for the following purpose/cc COVID-19 infection.  ? ?Past Medical History:  ?Past Medical History:  ?Diagnosis Date  ? Anemia   ? hx of prior to hysterectomy  ? BPPV (benign paroxysmal positional vertigo) 05/07/11  ? Described as room spinning, notes 2 episodes prior to Oct 2012.  Plans to undergo vestibular rehab.  ? Diverticulitis   ? with microperforation  ? Diverticulosis   ? Ear ache 03/12/2013  ? GERD (gastroesophageal reflux disease)   ? History of hypertension 05/07/2011  ? Hyperplastic colon polyp   ? Hypertension 05/07/11  ? with grade 2 diastolic dysfunction not on meds x 3 years   ? Internal hemorrhoids   ? PVC (premature ventricular contraction) 05/07/11  ? incidentally seen on telemetry  ? Tubular adenoma of colon   ?  ? ?ROS:  ?+ diarrhea, non-bloody, non-melanotic. Fever ?- dyspnea   ? ?Assessment / Plan / Recommendations:  ?Please see A&P under problem oriented charting for assessment of the patient's acute and chronic medical conditions.  ?As always, pt is advised that if symptoms worsen or new symptoms arise, they should go to an urgent care facility or to to ER for further evaluation.  ? ?Consent and Medical Decision Making:  ?Patient discussed with Dr. Philipp Ovens ?This is a telephone encounter between [place] and Jose Persia on 09/26/2021 for COVID-19 infection. The visit was conducted with the patient located at home and Jose Persia at [facility]. The patient's identity was confirmed using their DOB and current address. The patient has consented to being evaluated through a telephone encounter and understands the associated risks (an examination cannot be done and the patient may need to come in for an appointment) / benefits (allows the patient to remain at home, decreasing exposure to coronavirus). I personally  spent 9 minutes on medical discussion.   ? ?

## 2021-09-26 NOTE — Assessment & Plan Note (Signed)
[name] states that on March 18, she awoke and had a high fever at 103.5 Fahrenheit.  Due to this, she tested herself for COVID-19 at home; the test was positive.  She endorses persistent fevers that are responsive to ibuprofen and Tylenol.  She has also been experiencing nonbloody, nonmelanotic diarrhea that has become the most bothersome symptom for her.  She endorses mild productive cough but denies sore throat, rhinorrhea, nausea, vomiting.  She is unvaccinated. ? ?Assessment/plan: ?- Isolation for full 5 days  ?- Patient declined Paxlovid ?- Imodium as needed for diarrhea ?- Tylenol and ibuprofen alternated for fever ?- Counseled to contact the clinic if she continues to have fevers on day 5 as she will need an extension on her work note ?

## 2021-10-09 NOTE — Progress Notes (Signed)
Internal Medicine Clinic Attending  Case discussed with Dr. Basaraba  At the time of the visit.  We reviewed the resident's history and exam and pertinent patient test results.  I agree with the assessment, diagnosis, and plan of care documented in the resident's note.  

## 2021-12-27 ENCOUNTER — Encounter: Payer: Self-pay | Admitting: Internal Medicine

## 2022-01-01 ENCOUNTER — Encounter: Payer: Self-pay | Admitting: *Deleted

## 2022-01-10 ENCOUNTER — Encounter: Payer: Self-pay | Admitting: Internal Medicine

## 2022-01-10 ENCOUNTER — Other Ambulatory Visit: Payer: Self-pay | Admitting: Internal Medicine

## 2022-01-10 DIAGNOSIS — Z1231 Encounter for screening mammogram for malignant neoplasm of breast: Secondary | ICD-10-CM

## 2022-02-01 ENCOUNTER — Ambulatory Visit (AMBULATORY_SURGERY_CENTER): Payer: Self-pay

## 2022-02-01 VITALS — Ht 66.0 in | Wt 222.0 lb

## 2022-02-01 DIAGNOSIS — Z86010 Personal history of colonic polyps: Secondary | ICD-10-CM

## 2022-02-01 MED ORDER — NA SULFATE-K SULFATE-MG SULF 17.5-3.13-1.6 GM/177ML PO SOLN: 1.0000 | ORAL | 0 refills | Status: DC

## 2022-02-01 NOTE — Progress Notes (Signed)
No egg or soy allergy known to patient  No issues known to pt with past sedation with any surgeries or procedures Patient denies ever being told they had issues or difficulty with intubation  No FH of Malignant Hyperthermia Pt is not on diet pills Pt is not on  home 02  Pt is not on blood thinners  Pt denies issues with constipation  No A fib or A flutter Have any cardiac testing pending--denied Pt instructed to use Singlecare.com or GoodRx for a price reduction on prep   

## 2022-02-08 ENCOUNTER — Ambulatory Visit
Admission: RE | Admit: 2022-02-08 | Discharge: 2022-02-08 | Disposition: A | Discharge status: Home / Self Care | Payer: 59 | Source: Ambulatory Visit | Attending: Internal Medicine | Admitting: Internal Medicine

## 2022-02-08 DIAGNOSIS — Z1231 Encounter for screening mammogram for malignant neoplasm of breast: Secondary | ICD-10-CM

## 2022-02-23 ENCOUNTER — Ambulatory Visit (AMBULATORY_SURGERY_CENTER): Payer: 59 | Admitting: Internal Medicine

## 2022-02-23 ENCOUNTER — Encounter: Payer: Self-pay | Admitting: Internal Medicine

## 2022-02-23 VITALS — BP 118/70 | HR 56 | Temp 97.5°F | Resp 12 | Ht 66.0 in | Wt 222.0 lb

## 2022-02-23 DIAGNOSIS — Z09 Encounter for follow-up examination after completed treatment for conditions other than malignant neoplasm: Secondary | ICD-10-CM

## 2022-02-23 DIAGNOSIS — Z800 Family history of malignant neoplasm of digestive organs: Secondary | ICD-10-CM

## 2022-02-23 DIAGNOSIS — Z86010 Personal history of colonic polyps: Secondary | ICD-10-CM

## 2022-02-23 MED ORDER — SODIUM CHLORIDE 0.9 % IV SOLN: 500.0000 mL | Freq: Once | INTRAVENOUS | Status: DC

## 2022-02-23 NOTE — Patient Instructions (Signed)
Handouts provided about diverticulosis, and hemorrhoids.  Resume previous diet.  Continue present medications.  Repeat colonoscopy in 5 years for surveillance.  YOU HAD AN ENDOSCOPIC PROCEDURE TODAY AT [facility]:   Refer to the procedure report that was given to you for any specific questions about what was found during the examination.  If the procedure report does not answer your questions, please call your gastroenterologist to clarify.  If you requested that your care partner not be given the details of your procedure findings, then the procedure report has been included in a sealed envelope for you to review at your convenience later.  YOU SHOULD EXPECT: Some feelings of bloating in the abdomen. Passage of more gas than usual.  Walking can help get rid of the air that was put into your GI tract during the procedure and reduce the bloating. If you had a lower endoscopy (such as a colonoscopy or flexible sigmoidoscopy) you may notice spotting of blood in your stool or on the toilet paper. If you underwent a bowel prep for your procedure, you may not have a normal bowel movement for a few days.  Please Note:  You might notice some irritation and congestion in your nose or some drainage.  This is from the oxygen used during your procedure.  There is no need for concern and it should clear up in a day or so.  SYMPTOMS TO REPORT IMMEDIATELY:  Following lower endoscopy (colonoscopy or flexible sigmoidoscopy):  Excessive amounts of blood in the stool  Significant tenderness or worsening of abdominal pains  Swelling of the abdomen that is new, acute  Fever of 100F or higher   For urgent or emergent issues, a gastroenterologist can be reached at any hour by calling [phone]. Do not use MyChart messaging for urgent concerns.    DIET:  We do recommend a small meal at first, but then you may proceed to your regular diet.  Drink plenty of fluids but you should avoid alcoholic  beverages for 24 hours.  ACTIVITY:  You should plan to take it easy for the rest of today and you should NOT DRIVE or use heavy machinery until tomorrow (because of the sedation medicines used during the test).    FOLLOW UP: Our staff will call the number listed on your records the next business day following your procedure.  We will call around 7:15- 8:00 am to check on you and address any questions or concerns that you may have regarding the information given to you following your procedure. If we do not reach you, we will leave a message.  If you develop any symptoms (ie: fever, flu-like symptoms, shortness of breath, cough etc.) before then, please call [phone].  If you test positive for Covid 19 in the 2 weeks post procedure, please call and report this information to [place].    If any biopsies were taken you will be contacted by phone or by letter within the next 1-3 weeks.  Please call us at [phone] if you have not heard about the biopsies in 3 weeks.    SIGNATURES/CONFIDENTIALITY: You and/or your care partner have signed paperwork which will be entered into your electronic medical record.  These signatures attest to the fact that that the information above on your After Visit Summary has been reviewed and is understood.  Full responsibility of the confidentiality of this discharge information lies with you and/or your care-partner.

## 2022-02-23 NOTE — Op Note (Signed)
[facility] Patient Name: [name] Procedure Date: 02/23/2022 2:54 PM MRN: [id number] Endoscopist: Jerene Bears , MD Age: 56 Referring MD:  Date of Birth: 06-01-1966 Gender: Female Account #: [id number] Procedure:                Colonoscopy Indications:              High risk colon cancer surveillance: Personal                            history of non-advanced adenoma, Family history of                            colon cancer in a first-degree relative (brother)                            before age 13 years, Last colonoscopy: April 2018 Medicines:                Monitored Anesthesia Care Procedure:                Pre-Anesthesia Assessment:                           - Prior to the procedure, a History and Physical                            was performed, and patient medications and                            allergies were reviewed. The patient's tolerance of                            previous anesthesia was also reviewed. The risks                            and benefits of the procedure and the sedation                            options and risks were discussed with the patient.                            All questions were answered, and informed consent                            was obtained. Prior Anticoagulants: The patient has                            taken no previous anticoagulant or antiplatelet                            agents. ASA Grade Assessment: II - A patient with                            mild systemic disease. After reviewing the risks  and benefits, the patient was deemed in                            satisfactory condition to undergo the procedure.                           After obtaining informed consent, the colonoscope                            was passed under direct vision. Throughout the                            procedure, the patient's blood pressure, pulse, and                            oxygen  saturations were monitored continuously. The                            PCF-HQ190L Colonoscope was introduced through the                            anus and advanced to the cecum, identified by                            appendiceal orifice and ileocecal valve. The                            colonoscopy was performed without difficulty. The                            patient tolerated the procedure well. The quality                            of the bowel preparation was good. The ileocecal                            valve, appendiceal orifice, and rectum were                            photographed. Scope In: 3:02:36 PM Scope Out: 3:11:20 PM Scope Withdrawal Time: 0 hours 7 minutes 35 seconds  Total Procedure Duration: 0 hours 8 minutes 44 seconds  Findings:                 The digital rectal exam was normal.                           Multiple small and large-mouthed diverticula were                            found in the sigmoid colon and descending colon.                           There was evidence of a prior end-to-end  colo-colonic anastomosis in the sigmoid colon. This                            was patent and was characterized by healthy                            appearing mucosa.                           Internal hemorrhoids were found during                            retroflexion. The hemorrhoids were medium-sized. Complications:            No immediate complications. Estimated Blood Loss:     Estimated blood loss: none. Impression:               - Mild diverticulosis in the sigmoid colon and in                            the descending colon.                           - Patent end-to-end colo-colonic anastomosis,                            characterized by healthy appearing mucosa.                           - Internal hemorrhoids.                           - No specimens collected. Recommendation:           - Patient has a contact number  available for                            emergencies. The signs and symptoms of potential                            delayed complications were discussed with the                            patient. Return to normal activities tomorrow.                            Written discharge instructions were provided to the                            patient.                           - Resume previous diet.                           - Continue present medications.                           -  Repeat colonoscopy in 5 years for surveillance. Jerene Bears, MD 02/23/2022 3:15:23 PM This report has been signed electronically.

## 2022-02-23 NOTE — Progress Notes (Signed)
VS by CW  Pt's states no medical or surgical changes since previsit or office visit.  

## 2022-02-23 NOTE — Progress Notes (Signed)
Sedate, gd SR, tolerated procedure well, VSS, report to RN 

## 2022-02-26 ENCOUNTER — Telehealth: Payer: Self-pay

## 2022-02-26 NOTE — Telephone Encounter (Signed)
Follow up call placed, no answer and no VM. SChaplin, RN,BSN

## 2022-04-17 ENCOUNTER — Encounter: Payer: Self-pay | Admitting: Student

## 2022-04-17 ENCOUNTER — Other Ambulatory Visit: Payer: Self-pay

## 2022-04-17 ENCOUNTER — Ambulatory Visit (INDEPENDENT_AMBULATORY_CARE_PROVIDER_SITE_OTHER): Payer: 59 | Admitting: Student

## 2022-04-17 VITALS — BP 135/74 | HR 86 | Temp 98.1°F | Ht 66.0 in | Wt 225.3 lb

## 2022-04-17 DIAGNOSIS — Z6836 Body mass index (BMI) 36.0-36.9, adult: Secondary | ICD-10-CM

## 2022-04-17 DIAGNOSIS — E669 Obesity, unspecified: Secondary | ICD-10-CM

## 2022-04-17 DIAGNOSIS — K219 Gastro-esophageal reflux disease without esophagitis: Secondary | ICD-10-CM

## 2022-04-17 DIAGNOSIS — I10 Essential (primary) hypertension: Secondary | ICD-10-CM | POA: Diagnosis not present

## 2022-04-17 DIAGNOSIS — L920 Granuloma annulare: Secondary | ICD-10-CM

## 2022-04-17 DIAGNOSIS — Z87891 Personal history of nicotine dependence: Secondary | ICD-10-CM

## 2022-04-17 DIAGNOSIS — Z0000 Encounter for general adult medical examination without abnormal findings: Secondary | ICD-10-CM

## 2022-04-17 MED ORDER — HYDROCHLOROTHIAZIDE 12.5 MG PO CAPS: 12.5000 mg | ORAL_CAPSULE | Freq: Every day | ORAL | 3 refills | Status: DC

## 2022-04-17 MED ORDER — HYDROCHLOROTHIAZIDE 12.5 MG PO CAPS: 12.5000 mg | ORAL_CAPSULE | Freq: Every day | ORAL | 11 refills | Status: DC

## 2022-04-17 NOTE — Assessment & Plan Note (Signed)
Blood pressure stable at 135/74 today.  No complaints. - Continue hydrochlorothiazide 12.5 mg daily - Check BMP today

## 2022-04-17 NOTE — Progress Notes (Signed)
   CC: Follow-up for hypertension  HPI:  [name] is a 56 y.o. female with history as below who presents for a routine follow-up.  Please see encounters tab for problem-based charting  Past Medical History:  Diagnosis Date   Anemia    hx of prior to hysterectomy   BPPV (benign paroxysmal positional vertigo) 05/07/11   Described as room spinning, notes 2 episodes prior to Oct 2012.  Plans to undergo vestibular rehab.   Diverticulitis    with microperforation   Diverticulosis    Ear ache 03/12/2013   GERD (gastroesophageal reflux disease)    History of hypertension 05/07/2011   Hyperplastic colon polyp    Hypertension 05/07/11   with grade 2 diastolic dysfunction not on meds x 3 years    Internal hemorrhoids    PVC (premature ventricular contraction) 05/07/11   incidentally seen on telemetry   Tubular adenoma of colon    Review of Systems:   Endorses rash on the dorsal aspect of her hands.  No pain or pruritus with this rash. Denies fevers, chills, chest pain, shortness of breath, cough, nausea, vomiting, abdominal pain, constipation, diarrhea, dark tarry stools or bright red blood in her stool, pain with defecation, hematuria, dysuria, edema/pain/numbness/weakness in her upper or lower extremities. Physical Exam:  Vitals:   04/17/22 1431  BP: 135/74  Pulse: 86  Temp: 98.1 F (36.7 C)  TempSrc: Oral  SpO2: 97%  Weight: 225 lb 4.8 oz (102.2 kg)  Height: '5\' 6"'$  (1.676 m)   Constitutional: Obese female sitting in her chair. In no acute distress. HENT: Normocephalic, atraumatic Eyes: Sclera nonicteric, EOM intact Cardio:Regular rate and rhythm. No murmurs, rubs, or gallops.  2+ radial pulses bilaterally Pulm:Clear to auscultation bilaterally. Normal work of breathing on room air. Abdomen: Soft, nontender, nondistended, positive bowel sounds AVW:PVXYIAXK for extremity edema. Skin:Warm and dry.  Rash on the bilateral dorsal hands with red scalloped borders and  central clearing, nontender, no surrounding edema. Neuro:Alert and oriented x3. No focal deficit noted. Psych:Pleasant mood and affect.   Assessment & Plan:   See Encounters Tab for problem based charting.  Patient seen with Dr. Philipp Ovens

## 2022-04-17 NOTE — Assessment & Plan Note (Signed)
Patient is doing well with as needed treatment of GERD symptoms with over-the-counter famotidine.

## 2022-04-17 NOTE — Assessment & Plan Note (Signed)
Patient was discussed GLP agonist in the past and would like to avoid this at this time.  She has been working on her diet and exercise and has lost about 12 pounds this year.  She states that she has had a plateau although she has continued to keep her diet and exercise regimen.  We discussed that this is a common occurrence and people who are trying to lose weight.  She had tried to get in with the nutrition clinic but there was a long wait list and she no longer would like to go there as she has addressed her nutrition herself.  We again discussed medications including GLP agonist and she will think about this.  She was instructed to call if she would like to pursue this route. - Continue with diet and exercise

## 2022-04-17 NOTE — Assessment & Plan Note (Signed)
Refused flu vaccine today 

## 2022-04-17 NOTE — Assessment & Plan Note (Signed)
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  On exam she does have a red bordered rash with scalloping and central clearing.  She is using a steroid cream as prescribed by her dermatologist.  She will follow-up with her dermatologist as needed.  Denies any worsening or associated issues with the rash.

## 2022-04-17 NOTE — Assessment & Plan Note (Signed)
Mildly elevated calcium on last routine BMP at 10.4. - Repeat BMP today

## 2022-04-17 NOTE — Patient Instructions (Signed)
  Thank you, [name], for allowing [place] to provide your care today. Today we discussed . . .  > Hypertension       -Your blood pressure is well within range today at 135/74 and we will continue you on the hydrochlorothiazide 12.5 mg daily.  We will check a metabolic panel today to monitor your renal function and your electrolytes.  We will call you with any abnormal results. > Hypercalcemia       -As discussed during her visit your calcium was just slightly above normal the last time we checked labs so we will check it again on her metabolic panel.  We will call you with these results and discuss any further evaluation or changes to management if needed.  Also remember that we are available to assist you in addition to your diet and exercise to help you lose weight.  If you have any questions or would like to discuss this further please give our clinic a call and we can schedule you for a sooner appointment.  Otherwise please continue to diet and exercise as you have been.  I will attach information to the back of this packet about 2 diets that have been studied to help weight loss and decrease risk of cardiovascular disease.  I have ordered the following labs for you:   Lab Orders         BMP8+Anion Gap       Tests ordered today:  None   Referrals ordered today:   Referral Orders  No referral(s) requested today      I have ordered the following medication/changed the following medications:   Stop the following medications: Medications Discontinued During This Encounter  Medication Reason   hydrochlorothiazide (MICROZIDE) 12.5 MG capsule Reorder   hydrochlorothiazide (MICROZIDE) 12.5 MG capsule Reorder     Start the following medications: Meds ordered this encounter  Medications   DISCONTD: hydrochlorothiazide (MICROZIDE) 12.5 MG capsule    Sig: Take 1 capsule (12.5 mg total) by mouth daily.    Dispense:  30 capsule    Refill:  11   hydrochlorothiazide  (MICROZIDE) 12.5 MG capsule    Sig: Take 1 capsule (12.5 mg total) by mouth daily.    Dispense:  90 capsule    Refill:  3      Follow up: 6 months    Remember:     Should you have any questions or concerns please call the internal medicine clinic at [phone].     [name], [place]

## 2022-04-18 LAB — BMP8+ANION GAP
Anion Gap: 16 mmol/L (ref 10.0–18.0)
BUN/Creatinine Ratio: 18 (ref 9–23)
BUN: 14 mg/dL (ref 6–24)
CO2: 22 mmol/L (ref 20–29)
Calcium: 9.8 mg/dL (ref 8.7–10.2)
Chloride: 103 mmol/L (ref 96–106)
Creatinine, Ser: 0.8 mg/dL (ref 0.57–1.00)
Glucose: 85 mg/dL (ref 70–99)
Potassium: 4 mmol/L (ref 3.5–5.2)
Sodium: 141 mmol/L (ref 134–144)
eGFR: 86 mL/min/{1.73_m2} (ref >59)

## 2022-04-18 NOTE — Progress Notes (Signed)
Spoke to the patient on the phone about results of BMP which were normal and of note her previously elevated calcium is back within normal limits.

## 2022-04-20 NOTE — Progress Notes (Signed)
Internal Medicine Clinic Attending  I saw and evaluated the patient.  I personally confirmed the key portions of the history and exam documented by Dr. Oehlert and I reviewed pertinent patient test results.  The assessment, diagnosis, and plan were formulated together and I agree with the documentation in the resident's note.  

## 2022-07-13 ENCOUNTER — Other Ambulatory Visit: Payer: Self-pay

## 2022-07-13 ENCOUNTER — Telehealth: Payer: Self-pay | Admitting: Physician Assistant

## 2022-07-13 MED ORDER — AMOXICILLIN-POT CLAVULANATE 875-125 MG PO TABS: 1.0000 | ORAL_TABLET | Freq: Two times a day (BID) | ORAL | 0 refills | Status: DC

## 2022-07-13 NOTE — Telephone Encounter (Signed)
Spoke with pt and she is aware of recommendations. Script sent to pharmacy.

## 2022-07-13 NOTE — Telephone Encounter (Signed)
Patient has diverticulitis, she believes she is having a flare up, she stated she had a fever yesterday but it broke. However, she has abdominal pain and left side tenderness. Patient is wondering if she needs to schedule a follow up appointment.

## 2022-07-13 NOTE — Telephone Encounter (Signed)
Patient called back states she would also need a medication to go along with the Augmentin because she gets yeast infections all the time when she takes Augmentin. So she would like to get it in the event she starts the infection over the weekend.

## 2022-07-13 NOTE — Telephone Encounter (Signed)
Focus on hydration, low residue diet Augmentin 875 mg twice daily x 7 days To the ER this weekend if worsening; office follow-up if not completely resolved

## 2022-07-13 NOTE — Telephone Encounter (Signed)
Pt states she thinks she is having a diverticulitis flare. Yesterday she had a temp of 101.7, it went down yesterday to 100.4 and broke during the night. Today her temp is 98.6. She is having pain in her LLQ, that area is quite tender. She is drinking liquids to stay hydrated but when she eats food it makes the pain worse. She wants to know what Dr. Hilarie Fredrickson recommends. Please advise.

## 2022-07-16 ENCOUNTER — Other Ambulatory Visit: Payer: Self-pay

## 2022-07-16 MED ORDER — FLUCONAZOLE 150 MG PO TABS: 150.0000 mg | ORAL_TABLET | Freq: Every day | ORAL | 1 refills | Status: DC

## 2022-07-16 NOTE — Telephone Encounter (Signed)
Fluconazole 150 mg PO x 1, can give refill x 1 in the event 2nd dose is needed

## 2022-07-16 NOTE — Telephone Encounter (Signed)
Script sent to the pharmacy, pt aware. 

## 2022-08-31 ENCOUNTER — Ambulatory Visit (HOSPITAL_COMMUNITY)
Admission: RE | Admit: 2022-08-31 | Discharge: 2022-08-31 | Disposition: A | Discharge status: Home / Self Care | Payer: 59 | Source: Ambulatory Visit | Attending: Internal Medicine | Admitting: Internal Medicine

## 2022-08-31 ENCOUNTER — Ambulatory Visit (INDEPENDENT_AMBULATORY_CARE_PROVIDER_SITE_OTHER): Payer: 59 | Admitting: Internal Medicine

## 2022-08-31 ENCOUNTER — Encounter: Payer: Self-pay | Admitting: Internal Medicine

## 2022-08-31 VITALS — BP 151/79 | HR 88 | Temp 97.9°F | Ht 66.0 in | Wt 226.5 lb

## 2022-08-31 DIAGNOSIS — Z87891 Personal history of nicotine dependence: Secondary | ICD-10-CM | POA: Diagnosis not present

## 2022-08-31 DIAGNOSIS — I10 Essential (primary) hypertension: Secondary | ICD-10-CM

## 2022-08-31 DIAGNOSIS — M25512 Pain in left shoulder: Secondary | ICD-10-CM

## 2022-08-31 DIAGNOSIS — M67912 Unspecified disorder of synovium and tendon, left shoulder: Secondary | ICD-10-CM

## 2022-08-31 DIAGNOSIS — R911 Solitary pulmonary nodule: Secondary | ICD-10-CM | POA: Diagnosis not present

## 2022-08-31 MED ORDER — NAPROXEN 500 MG PO TABS: 500.0000 mg | ORAL_TABLET | Freq: Two times a day (BID) | ORAL | 0 refills | Status: AC

## 2022-08-31 NOTE — Patient Instructions (Signed)
Ms.Elexa Pavandeep Lawler, it was a pleasure seeing you today! You endorsed feeling well today. Below are some of the things we talked about this visit. We look forward to seeing you in the follow up appointment!  Today we discussed: We will give you NSAID called naproxen. Please take it twice a day. Take tylenol 1000 mg three times daily. We will get x ray and refer you to Physical therapy.   I have ordered the following labs today:   Lab Orders         BMP8+Anion Gap       Referrals ordered today:    Referral Orders         Ambulatory referral to Physical Therapy      I have ordered the following medication/changed the following medications:   Stop the following medications: Medications Discontinued During This Encounter  Medication Reason   amoxicillin-clavulanate (AUGMENTIN) 875-125 MG tablet Completed Course   fluconazole (DIFLUCAN) 150 MG tablet Completed Course     Start the following medications: Meds ordered this encounter  Medications   naproxen (NAPROSYN) 500 MG tablet    Sig: Take 1 tablet (500 mg total) by mouth 2 (two) times daily with a meal for 14 days.    Dispense:  28 tablet    Refill:  0     Follow-up: 2 week follow up   Please make sure to arrive 15 minutes prior to your next appointment. If you arrive late, you may be asked to reschedule.   We look forward to seeing you next time. Please call our clinic at [phone] if you have any questions or concerns. The best time to call is Monday-Friday from 9am-4pm, but there is someone available 24/7. If after hours or the weekend, call the main hospital number and ask for the Internal Medicine Resident On-Call. If you need medication refills, please notify your pharmacy one week in advance and they will send [place] a request.  Thank you for letting us take part in your care. Wishing you the best!  Thank you, Idamae Schuller, MD

## 2022-08-31 NOTE — Progress Notes (Unsigned)
CC: left shoulder pain  HPI:  [name] is a 57 y.o. with medical history of HTN, GERD, Hx of diverticulitis, Obesity presenting to [facility] for acute complaint of left shoulder pain.   Please see problem-based list for further details, assessments, and plans.  Past Medical History:  Diagnosis Date   Anemia    hx of prior to hysterectomy   BPPV (benign paroxysmal positional vertigo) 05/07/11   Described as room spinning, notes 2 episodes prior to Oct 2012.  Plans to undergo vestibular rehab.   Diverticulitis    with microperforation   Diverticulosis    Ear ache 03/12/2013   GERD (gastroesophageal reflux disease)    History of hypertension 05/07/2011   Hyperplastic colon polyp    Hypertension 05/07/11   with grade 2 diastolic dysfunction not on meds x 3 years    Internal hemorrhoids    PVC (premature ventricular contraction) 05/07/11   incidentally seen on telemetry   Tubular adenoma of colon      Current Outpatient Medications (Cardiovascular):    hydrochlorothiazide (MICROZIDE) 12.5 MG capsule, Take 1 capsule (12.5 mg total) by mouth daily.   Current Outpatient Medications (Analgesics):    naproxen (NAPROSYN) 500 MG tablet, Take 1 tablet (500 mg total) by mouth 2 (two) times daily with a meal for 14 days.   ibuprofen (ADVIL,MOTRIN) 200 MG tablet, Take 400 mg by mouth daily as needed for headache or moderate pain.   Current Outpatient Medications (Other):    famotidine (PEPCID) 20 MG tablet, Take 20 mg by mouth daily as needed for heartburn or indigestion. As needed   Multiple Vitamin (MULITIVITAMIN WITH MINERALS) TABS, Take 1 tablet by mouth daily.  Review of Systems:  Review of system negative unless stated in the problem list or HPI.    Physical Exam:  Vitals:   08/31/22 1104  BP: (!) 151/79  Pulse: 88  Temp: 97.9 F (36.6 C)  TempSrc: Oral  SpO2: 96%  Weight: 226 lb 8 oz (102.7 kg)  Height: '5\' 6"'$  (1.676 m)    Physical Exam General: NAD HENT:  NCAT Lungs: CTAB, no wheeze, rhonchi or rales.  Cardiovascular: Normal heart sounds, no r/m/g, 2+ pulses in all extremities. No LE edema Abdomen: No TTP, normal bowel sounds MSK: No asymmetry or muscle atrophy. TTP of left shoulder. Limitation of passive ROM due to pain. Neer's positive, hawkins positive.  Skin: no lesions noted on exposed skin Neuro: Alert and oriented x4. CN grossly intact Psych: Normal mood and normal affect   Assessment & Plan:   Left shoulder pain Pt has shoulder pain that started 4 weeks ago. She states she brought a minifreezer around that time that she moved. No other inciting factors. She states she has a dull ache at rest, but sharp pain with movement. Also reports a numbness sensation radiating down the arm. No hx of shoulder pain previously. She reports she dislikes taking medications and has only taken a few ibuprofen. Pain is relieved by heat and exercises. Has not used APAP or topical medications. Exam with no swelling, erythema, and slight TTP of the shoulder. Positive Neer's and Hawkin's test. Patient's history and exam consistent with impingement syndrome. Will continue conservative measures and place referral to physical therapy. Obtained left shoulder radiograph which shows no dislocation, fractures, or OA. It does show lung nodules (see lung nodule). Advised 2 week follow up.   Lung nodule seen on imaging study Pt's shoulder x ray showed possible lung nodules. Will advise pt  to have a CXR performed for better characterization and if present may further work up including CT of chest. BMP showed hypercalcemia but no other electrolyte abnormalities.    See Encounters Tab for problem based charting.  Patient discussed with Dr. Dollene Cleveland, MD Tillie Rung. [facility] Internal Medicine Residency, PGY-2

## 2022-09-01 LAB — BMP8+ANION GAP
Anion Gap: 14 mmol/L (ref 10.0–18.0)
BUN/Creatinine Ratio: 22 (ref 9–23)
BUN: 17 mg/dL (ref 6–24)
CO2: 21 mmol/L (ref 20–29)
Calcium: 10.3 mg/dL — ABNORMAL HIGH (ref 8.7–10.2)
Chloride: 105 mmol/L (ref 96–106)
Creatinine, Ser: 0.77 mg/dL (ref 0.57–1.00)
Glucose: 112 mg/dL — ABNORMAL HIGH (ref 70–99)
Potassium: 4 mmol/L (ref 3.5–5.2)
Sodium: 140 mmol/L (ref 134–144)
eGFR: 90 mL/min/{1.73_m2} (ref >59)

## 2022-09-02 DIAGNOSIS — R911 Solitary pulmonary nodule: Secondary | ICD-10-CM | POA: Insufficient documentation

## 2022-09-02 DIAGNOSIS — M25512 Pain in left shoulder: Secondary | ICD-10-CM | POA: Insufficient documentation

## 2022-09-02 NOTE — Assessment & Plan Note (Addendum)
Pt has shoulder pain that started 4 weeks ago. She states she brought a minifreezer around that time that she moved. No other inciting factors. She states she has a dull ache at rest, but sharp pain with movement. Also reports a numbness sensation radiating down the arm. No hx of shoulder pain previously. She reports she dislikes taking medications and has only taken a few ibuprofen. Pain is relieved by heat and exercises. Has not used APAP or topical medications. Exam with no swelling, erythema, and slight TTP of the shoulder. Positive Neer's and Hawkin's test. Patient's history and exam consistent with impingement syndrome. Will continue conservative measures and place referral to physical therapy. Obtained left shoulder radiograph which shows no dislocation, fractures, or OA. It does show lung nodules (see lung nodule). Advised 2 week follow up.

## 2022-09-02 NOTE — Assessment & Plan Note (Signed)
Pt's shoulder x ray showed possible lung nodules. Will advise pt to have a CXR performed for better characterization and if present may further work up including CT of chest. BMP showed hypercalcemia but no other electrolyte abnormalities.

## 2022-09-03 NOTE — Addendum Note (Signed)
Addended by: Idamae Schuller on: 09/03/2022 11:49 AM   Modules accepted: Orders

## 2022-09-04 ENCOUNTER — Ambulatory Visit (HOSPITAL_COMMUNITY)
Admission: RE | Admit: 2022-09-04 | Discharge: 2022-09-04 | Disposition: A | Discharge status: Home / Self Care | Payer: 59 | Source: Ambulatory Visit | Attending: Internal Medicine | Admitting: Internal Medicine

## 2022-09-04 DIAGNOSIS — R911 Solitary pulmonary nodule: Secondary | ICD-10-CM | POA: Insufficient documentation

## 2022-09-04 NOTE — Progress Notes (Signed)
Internal Medicine Clinic Attending  Case discussed with Dr. Khan  At the time of the visit.  We reviewed the resident's history and exam and pertinent patient test results.  I agree with the assessment, diagnosis, and plan of care documented in the resident's note.  

## 2022-09-07 ENCOUNTER — Telehealth: Payer: Self-pay | Admitting: *Deleted

## 2022-09-07 NOTE — Telephone Encounter (Signed)
Call from Radiology with results of patient's recent CXR report.  Will alert Dr. Humphrey Rolls to view results.

## 2022-09-08 ENCOUNTER — Other Ambulatory Visit: Payer: Self-pay | Admitting: Internal Medicine

## 2022-09-08 DIAGNOSIS — R911 Solitary pulmonary nodule: Secondary | ICD-10-CM

## 2022-09-08 NOTE — Progress Notes (Signed)
CXR with two possible nodules. Will order non-contrast CT to getter a image and if present help with characterization.   Idamae Schuller, MD Tillie Rung. [facility] Internal Medicine Residency, PGY-2

## 2022-09-11 ENCOUNTER — Other Ambulatory Visit: Payer: Self-pay | Admitting: Internal Medicine

## 2022-09-11 DIAGNOSIS — M67912 Unspecified disorder of synovium and tendon, left shoulder: Secondary | ICD-10-CM

## 2022-09-24 ENCOUNTER — Telehealth: Payer: Self-pay | Admitting: *Deleted

## 2022-09-24 NOTE — Telephone Encounter (Signed)
Patient states she received letter from [place] stating there was incomplete clinical info entered on website. Will forward to Referral Coordinator.

## 2022-09-24 NOTE — Telephone Encounter (Signed)
Please call patient as her insurance is requesting more information for the CT scan as they may not pay for it. Patient is asking if she really need the CT scan. Please call her about this.

## 2022-10-16 ENCOUNTER — Ambulatory Visit (HOSPITAL_COMMUNITY)
Admission: RE | Admit: 2022-10-16 | Discharge: 2022-10-16 | Disposition: A | Discharge status: Home / Self Care | Payer: 59 | Source: Ambulatory Visit | Attending: Internal Medicine | Admitting: Internal Medicine

## 2022-10-16 DIAGNOSIS — R911 Solitary pulmonary nodule: Secondary | ICD-10-CM

## 2022-10-31 ENCOUNTER — Other Ambulatory Visit: Payer: 59

## 2022-10-31 ENCOUNTER — Other Ambulatory Visit: Payer: Self-pay | Admitting: Internal Medicine

## 2022-10-31 DIAGNOSIS — I3139 Other pericardial effusion (noninflammatory): Secondary | ICD-10-CM

## 2022-10-31 DIAGNOSIS — R911 Solitary pulmonary nodule: Secondary | ICD-10-CM

## 2022-10-31 NOTE — Progress Notes (Signed)
Discussed CT findings with Dr. Criselda Peaches. She is in agreement to pursue further workup to look for rheumatological cause. Plan will be to re-obtain imaging in 3 months per guidelines. Will get echo to characterize the cardiomegaly and pericardial effusion. Given hypercalcemia, and multiple lung nodules will get ACE level as well. ANA to check for other rheumatological disorders. Will get Vitamin D, Phosphorus, and PTH for persistent hypercalcemia.   Gwenevere Abbot, MD Eligha Bridegroom. [facility] Internal Medicine Residency, PGY-2

## 2022-11-05 ENCOUNTER — Other Ambulatory Visit: Payer: Self-pay | Admitting: Internal Medicine

## 2022-11-05 DIAGNOSIS — K219 Gastro-esophageal reflux disease without esophagitis: Secondary | ICD-10-CM

## 2022-11-05 DIAGNOSIS — R911 Solitary pulmonary nodule: Secondary | ICD-10-CM

## 2022-11-05 LAB — ANGIOTENSIN CONVERTING ENZYME: Angio Convert Enzyme: 34 U/L (ref 14–82)

## 2022-11-05 LAB — PHOSPHORUS: Phosphorus: 3.4 mg/dL (ref 3.0–4.3)

## 2022-11-05 LAB — ANTINUCLEAR ANTIBODIES, IFA: ANA Titer 1: NEGATIVE

## 2022-11-05 LAB — VITAMIN D 25 HYDROXY (VIT D DEFICIENCY, FRACTURES): Vit D, 25-Hydroxy: 28.3 ng/mL — ABNORMAL LOW (ref 30.0–100.0)

## 2022-11-05 LAB — PARATHYROID HORMONE, INTACT (NO CA): PTH: 37 pg/mL (ref 15–65)

## 2022-11-05 MED ORDER — OMEPRAZOLE 40 MG PO CPDR: 40.0000 mg | DELAYED_RELEASE_CAPSULE | Freq: Every day | ORAL | 1 refills | Status: DC

## 2022-11-06 ENCOUNTER — Ambulatory Visit (HOSPITAL_COMMUNITY)
Admission: RE | Admit: 2022-11-06 | Discharge: 2022-11-06 | Disposition: A | Discharge status: Home / Self Care | Payer: 59 | Source: Ambulatory Visit | Attending: Internal Medicine | Admitting: Internal Medicine

## 2022-11-06 DIAGNOSIS — I10 Essential (primary) hypertension: Secondary | ICD-10-CM | POA: Diagnosis not present

## 2022-11-06 DIAGNOSIS — I493 Ventricular premature depolarization: Secondary | ICD-10-CM | POA: Diagnosis not present

## 2022-11-06 DIAGNOSIS — I3139 Other pericardial effusion (noninflammatory): Secondary | ICD-10-CM | POA: Diagnosis present

## 2022-11-06 DIAGNOSIS — I340 Nonrheumatic mitral (valve) insufficiency: Secondary | ICD-10-CM | POA: Diagnosis not present

## 2022-11-06 NOTE — Progress Notes (Signed)
  Echocardiogram 2D Echocardiogram has been performed.  Delcie Roch 11/06/2022, 2:17 PM

## 2022-11-07 LAB — ECHOCARDIOGRAM COMPLETE
AR max vel: 2.62 cm2
AV Area VTI: 2.65 cm2
AV Area mean vel: 2.67 cm2
AV Mean grad: 3 mmHg
AV Peak grad: 4.7 mmHg
Ao pk vel: 1.08 m/s
Area-P 1/2: 2.16 cm2
MV M vel: 4.82 m/s
MV Peak grad: 92.9 mmHg
S' Lateral: 3.4 cm

## 2022-11-15 ENCOUNTER — Ambulatory Visit (INDEPENDENT_AMBULATORY_CARE_PROVIDER_SITE_OTHER): Payer: 59 | Admitting: Pulmonary Disease

## 2022-11-15 ENCOUNTER — Other Ambulatory Visit: Payer: Self-pay

## 2022-11-15 ENCOUNTER — Encounter: Payer: Self-pay | Admitting: Pulmonary Disease

## 2022-11-15 VITALS — BP 114/90 | HR 92 | Ht 66.0 in | Wt 228.6 lb

## 2022-11-15 DIAGNOSIS — R222 Localized swelling, mass and lump, trunk: Secondary | ICD-10-CM

## 2022-11-15 DIAGNOSIS — Z720 Tobacco use: Secondary | ICD-10-CM | POA: Diagnosis not present

## 2022-11-15 DIAGNOSIS — Z87891 Personal history of nicotine dependence: Secondary | ICD-10-CM

## 2022-11-15 DIAGNOSIS — Z122 Encounter for screening for malignant neoplasm of respiratory organs: Secondary | ICD-10-CM

## 2022-11-15 NOTE — Progress Notes (Signed)
Shared Decision Making Visit Lung Cancer Screening Program [phone])   Eligibility: Age 57 y.o. Pack Years Smoking History Calculation 22.5 year (# packs/per year x # years smoked) Recent History of coughing up blood  no Unexplained weight loss? no ( >Than 15 pounds within the last 6 months ) Prior History Lung / other cancer no (Diagnosis within the last 5 years already requiring surveillance chest CT Scans). Smoking Status Former Smoker Former Smokers: Years since quit: 7 years  Quit Date: March 2017  Visit Components: Discussion included one or more decision making aids. yes Discussion included risk/benefits of screening. yes Discussion included potential follow up diagnostic testing for abnormal scans. yes Discussion included meaning and risk of over diagnosis. yes Discussion included meaning and risk of False Positives. yes Discussion included meaning of total radiation exposure. yes  Counseling Included: Importance of adherence to annual lung cancer LDCT screening. yes Impact of comorbidities on ability to participate in the program. yes Ability and willingness to under diagnostic treatment. yes  Josephine Igo, DO

## 2022-11-15 NOTE — Progress Notes (Signed)
Synopsis: Referred in May 2024 for abnormal CT chest by Inez Catalina, MD  Subjective:   [name] GENDER: female DOB: 1966/01/31, MRN: [id number]  Chief Complaint  Patient presents with   Consult    Lung nodules.    This is a 57 year old female, past medical history of hypertension, hyperlipidemia, GERD.  She had a tubular adenoma of the colon in the past.  No other previous cancer history.  Patient is a former smoker she smoked for 30 years from age 71 to age 48 she quit in 2017 and has a 22.5-pack-year history of smoking.  She has not had lung cancer screening CTs in the past.  Patient has no complaints from a respiratory standpoint today.  The abnormality was found incidentally on CT imaging by her primary care office.    Past Medical History:  Diagnosis Date   Anemia    hx of prior to hysterectomy   BPPV (benign paroxysmal positional vertigo) 05/07/11   Described as room spinning, notes 2 episodes prior to Oct 2012.  Plans to undergo vestibular rehab.   Diverticulitis    with microperforation   Diverticulosis    Ear ache 03/12/2013   GERD (gastroesophageal reflux disease)    History of hypertension 05/07/2011   Hyperplastic colon polyp    Hypertension 05/07/11   with grade 2 diastolic dysfunction not on meds x 3 years    Internal hemorrhoids    PVC (premature ventricular contraction) 05/07/11   incidentally seen on telemetry   Tubular adenoma of colon      Family History  Problem Relation Age of Onset   Diabetes Mother    Breast cancer Mother    Deep vein thrombosis Father        died from cerebral embolus   Colon cancer Brother 85   Cervical cancer Maternal Aunt    Diabetes Maternal Grandmother    Esophageal cancer Neg Hx    Rectal cancer Neg Hx    Stomach cancer Neg Hx    Colon polyps Neg Hx      Past Surgical History:  Procedure Laterality Date   BREAST BIOPSY Left    CERVICAL BIOPSY  W/ LOOP ELECTRODE EXCISION      CHOLECYSTECTOMY     COLON SURGERY  2017   sigmoidectomy   COLONOSCOPY     LAPAROSCOPIC CHOLECYSTECTOMY SINGLE SITE WITH INTRAOPERATIVE CHOLANGIOGRAM N/A 05/27/2015   Procedure: LAPAROSCOPIC CHOLECYSTECTOMY SINGLE SITE WITH INTRAOPERATIVE CHOLANGIOGRAM;  Surgeon: Karie Soda, MD;  Location: [facility] OR[facility];  Service: General;  Laterality: N/A;   PROCTOSCOPY N/A 12/07/2016   Procedure: RIGID PROCTOSCOPY;  Surgeon: Karie Soda, MD;  Location: [facility];  Service: General;  Laterality: N/A;   TONSILLECTOMY     UMBILICAL HERNIA REPAIR     Approximately 1974   VAGINAL HYSTERECTOMY  07/2010    Social History   Socioeconomic History   Marital status: Single    Spouse name: Not on file   Number of children: 2   Years of education: Not on file   Highest education level: Not on file  Occupational History    Employer: [employer]  Tobacco Use   Smoking status: Former    Packs/day: 0.10    Years: 20.00    Additional pack years: 0.00    Total pack years: 2.00    Types: Cigarettes    Quit date: 07/09/2005    Years since quitting: 17.3   Smokeless tobacco: Never   Tobacco comments:  1 black & mild a week  Vaping Use   Vaping Use: Never used  Substance and Sexual Activity   Alcohol use: Not Currently    Comment: Glass of wine once every few months    Drug use: No   Sexual activity: Yes    Birth control/protection: None  Other Topics Concern   Not on file  Social History Narrative   Has two children and one granddaughter. Lives by herself.   Social Determinants of Health   Financial Resource Strain: Not on file  Food Insecurity: Not on file  Transportation Needs: Not on file  Physical Activity: Not on file  Stress: Not on file  Social Connections: Not on file  Intimate Partner Violence: Not on file     Allergies  Allergen Reactions   Lisinopril Cough   Morphine And Related Nausea And Vomiting   Latex Itching and Rash     Outpatient Medications Prior to Visit  Medication  Sig Dispense Refill   cholecalciferol (VITAMIN D3) 25 MCG (1000 UNIT) tablet Take 1,000 Units by mouth daily.     famotidine (PEPCID) 20 MG tablet Take 20 mg by mouth daily as needed for heartburn or indigestion. As needed     hydrochlorothiazide (MICROZIDE) 12.5 MG capsule Take 1 capsule (12.5 mg total) by mouth daily. 90 capsule 3   ibuprofen (ADVIL,MOTRIN) 200 MG tablet Take 400 mg by mouth daily as needed for headache or moderate pain.     Multiple Vitamin (MULITIVITAMIN WITH MINERALS) TABS Take 1 tablet by mouth daily.     omeprazole (PRILOSEC) 40 MG capsule Take 1 capsule (40 mg total) by mouth daily. 30 capsule 1   No facility-administered medications prior to visit.    Review of Systems  Constitutional:  Negative for chills, fever, malaise/fatigue and weight loss.  HENT:  Negative for hearing loss, sore throat and tinnitus.   Eyes:  Negative for blurred vision and double vision.  Respiratory:  Negative for cough, hemoptysis, sputum production, shortness of breath, wheezing and stridor.   Cardiovascular:  Negative for chest pain, palpitations, orthopnea, leg swelling and PND.  Gastrointestinal:  Negative for abdominal pain, constipation, diarrhea, heartburn, nausea and vomiting.  Genitourinary:  Negative for dysuria, hematuria and urgency.  Musculoskeletal:  Negative for joint pain and myalgias.  Skin:  Negative for itching and rash.  Neurological:  Negative for dizziness, tingling, weakness and headaches.  Endo/Heme/Allergies:  Negative for environmental allergies. Does not bruise/bleed easily.  Psychiatric/Behavioral:  Negative for depression. The patient is not nervous/anxious and does not have insomnia.   All other systems reviewed and are negative.    Objective:  Physical Exam Vitals reviewed.  Constitutional:      General: She is not in acute distress.    Appearance: She is well-developed. She is obese.  HENT:     Head: Normocephalic and atraumatic.  Eyes:      General: No scleral icterus.    Conjunctiva/sclera: Conjunctivae normal.     Pupils: Pupils are equal, round, and reactive to light.  Neck:     Vascular: No JVD.     Trachea: No tracheal deviation.  Cardiovascular:     Rate and Rhythm: Normal rate and regular rhythm.     Heart sounds: Normal heart sounds. No murmur heard. Pulmonary:     Effort: Pulmonary effort is normal. No tachypnea, accessory muscle usage or respiratory distress.     Breath sounds: No stridor. No wheezing, rhonchi or rales.  Abdominal:  General: There is no distension.     Palpations: Abdomen is soft.     Tenderness: There is no abdominal tenderness.  Musculoskeletal:        General: No tenderness.     Cervical back: Neck supple.  Lymphadenopathy:     Cervical: No cervical adenopathy.  Skin:    General: Skin is warm and dry.     Capillary Refill: Capillary refill takes less than 2 seconds.     Findings: No rash.  Neurological:     Mental Status: She is alert and oriented to person, place, and time.  Psychiatric:        Behavior: Behavior normal.      Vitals:   11/15/22 1337  BP: (!) 114/90  Pulse: 92  SpO2: 94%  Weight: 228 lb 9.6 oz (103.7 kg)  Height: 5\' 6"  (1.676 m)   94% on RA BMI Readings from Last 3 Encounters:  11/15/22 36.90 kg/m  08/31/22 36.56 kg/m  04/17/22 36.36 kg/m   Wt Readings from Last 3 Encounters:  11/15/22 228 lb 9.6 oz (103.7 kg)  08/31/22 226 lb 8 oz (102.7 kg)  04/17/22 225 lb 4.8 oz (102.2 kg)     CBC    Component Value Date/Time   WBC 13.3 (H) 12/08/2016 0845   RBC 3.70 (L) 12/08/2016 0845   HGB 11.9 (L) 12/08/2016 0845   HCT 34.0 (L) 12/08/2016 0845   PLT 213 12/08/2016 0845   MCV 91.9 12/08/2016 0845   MCH 32.2 12/08/2016 0845   MCHC 35.0 12/08/2016 0845   RDW 12.3 12/08/2016 0845   LYMPHSABS 4.0 09/14/2016 1206   MONOABS 0.5 09/14/2016 1206   EOSABS 0.1 09/14/2016 1206   BASOSABS 0.1 09/14/2016 1206     Chest Imaging:  10/16/2022: CT chest  with 3 areas of small subpleural nodules in the posterior left chest. The patient's images have been independently reviewed by me.    Pulmonary Functions Testing Results:     No data to display          FeNO:   Pathology:   Echocardiogram:   Heart Catheterization:     Assessment & Plan:     ICD-10-CM   1. Tobacco use  Z72.0 Ambulatory Referral for Lung Cancer Scre    2. Pleural nodule  R22.2       Discussion:  This is a 57 year old female, former smoker, does qualify for lung cancer screening, I have placed orders for LDCT follow-up.  She is found to have pleural nodule that was on her CT scan found incidentally.  Plan: Please see separate documentation regarding shared decision making visit. Please see referral to lung cancer screening program. She needs a 38-month noncontrast CT follow-up. Will be able to do this with her lung cancer screening CT that we will schedule. Have her follow-up with SG, NP after LDCT complete.    Current Outpatient Medications:    cholecalciferol (VITAMIN D3) 25 MCG (1000 UNIT) tablet, Take 1,000 Units by mouth daily., Disp: , Rfl:    famotidine (PEPCID) 20 MG tablet, Take 20 mg by mouth daily as needed for heartburn or indigestion. As needed, Disp: , Rfl:    hydrochlorothiazide (MICROZIDE) 12.5 MG capsule, Take 1 capsule (12.5 mg total) by mouth daily., Disp: 90 capsule, Rfl: 3   ibuprofen (ADVIL,MOTRIN) 200 MG tablet, Take 400 mg by mouth daily as needed for headache or moderate pain., Disp: , Rfl:    Multiple Vitamin (MULITIVITAMIN WITH MINERALS) TABS, Take 1 tablet by  mouth daily., Disp: , Rfl:    omeprazole (PRILOSEC) 40 MG capsule, Take 1 capsule (40 mg total) by mouth daily., Disp: 30 capsule, Rfl: 1   Josephine Igo, DO [facility] 11/15/2022 2:11 PM

## 2022-11-15 NOTE — Patient Instructions (Addendum)
Thank you for visiting Dr. Tonia Brooms at [facility]. Today we recommend the following:  Orders Placed This Encounter  Procedures   Ambulatory Referral for Lung Cancer Scre   Return in about 3 months (around 02/15/2023) for w/ Kandice Robinsons, NP , after CT Chest.    Please do your part to reduce the spread of COVID-19.

## 2022-12-29 ENCOUNTER — Other Ambulatory Visit: Payer: Self-pay | Admitting: Internal Medicine

## 2022-12-29 DIAGNOSIS — K219 Gastro-esophageal reflux disease without esophagitis: Secondary | ICD-10-CM

## 2023-01-09 ENCOUNTER — Other Ambulatory Visit: Payer: Self-pay | Admitting: Student

## 2023-01-09 DIAGNOSIS — Z1231 Encounter for screening mammogram for malignant neoplasm of breast: Secondary | ICD-10-CM

## 2023-02-12 ENCOUNTER — Ambulatory Visit: Payer: 59 | Admitting: Acute Care

## 2023-02-12 ENCOUNTER — Ambulatory Visit
Admission: RE | Admit: 2023-02-12 | Discharge: 2023-02-12 | Disposition: A | Discharge status: Home / Self Care | Payer: 59 | Source: Ambulatory Visit | Attending: Internal Medicine | Admitting: Internal Medicine

## 2023-02-12 DIAGNOSIS — Z1231 Encounter for screening mammogram for malignant neoplasm of breast: Secondary | ICD-10-CM

## 2023-02-15 ENCOUNTER — Ambulatory Visit
Admission: RE | Admit: 2023-02-15 | Discharge: 2023-02-15 | Disposition: A | Discharge status: Home / Self Care | Payer: 59 | Source: Ambulatory Visit | Attending: Acute Care | Admitting: Acute Care

## 2023-02-15 DIAGNOSIS — Z87891 Personal history of nicotine dependence: Secondary | ICD-10-CM

## 2023-02-15 DIAGNOSIS — Z122 Encounter for screening for malignant neoplasm of respiratory organs: Secondary | ICD-10-CM

## 2023-02-16 ENCOUNTER — Encounter: Payer: Self-pay | Admitting: Student

## 2023-02-16 NOTE — Progress Notes (Signed)
A letter with her negative mammogram results has been mailed to the patient.

## 2023-02-17 ENCOUNTER — Ambulatory Visit (HOSPITAL_COMMUNITY)
Admission: EM | Admit: 2023-02-17 | Discharge: 2023-02-17 | Disposition: A | Discharge status: Home / Self Care | Payer: 59 | Attending: Family Medicine | Admitting: Family Medicine

## 2023-02-17 ENCOUNTER — Encounter (HOSPITAL_COMMUNITY): Payer: Self-pay

## 2023-02-17 ENCOUNTER — Emergency Department (HOSPITAL_COMMUNITY): Payer: 59

## 2023-02-17 ENCOUNTER — Other Ambulatory Visit: Payer: Self-pay

## 2023-02-17 ENCOUNTER — Emergency Department (HOSPITAL_COMMUNITY)
Admission: EM | Admit: 2023-02-17 | Discharge: 2023-02-17 | Disposition: A | Discharge status: Home / Self Care | Payer: 59 | Attending: Student | Admitting: Student

## 2023-02-17 DIAGNOSIS — Z91040 Latex allergy status: Secondary | ICD-10-CM | POA: Diagnosis not present

## 2023-02-17 DIAGNOSIS — R1031 Right lower quadrant pain: Secondary | ICD-10-CM | POA: Diagnosis not present

## 2023-02-17 DIAGNOSIS — I10 Essential (primary) hypertension: Secondary | ICD-10-CM | POA: Diagnosis not present

## 2023-02-17 LAB — URINALYSIS, ROUTINE W REFLEX MICROSCOPIC
Bilirubin Urine: NEGATIVE
Glucose, UA: NEGATIVE mg/dL
Hgb urine dipstick: NEGATIVE
Ketones, ur: NEGATIVE mg/dL
Nitrite: NEGATIVE
Protein, ur: NEGATIVE mg/dL
Specific Gravity, Urine: 1.023 (ref 1.005–1.030)
pH: 5 (ref 5.0–8.0)

## 2023-02-17 LAB — COMPREHENSIVE METABOLIC PANEL
ALT: 20 U/L (ref 0–44)
AST: 20 U/L (ref 15–41)
Albumin: 4 g/dL (ref 3.5–5.0)
Alkaline Phosphatase: 76 U/L (ref 38–126)
Anion gap: 12 (ref 5–15)
BUN: 15 mg/dL (ref 6–20)
CO2: 21 mmol/L — ABNORMAL LOW (ref 22–32)
Calcium: 9.6 mg/dL (ref 8.9–10.3)
Chloride: 104 mmol/L (ref 98–111)
Creatinine, Ser: 0.84 mg/dL (ref 0.44–1.00)
GFR, Estimated: >60 mL/min (ref >60)
Glucose, Bld: 153 mg/dL — ABNORMAL HIGH (ref 70–99)
Potassium: 3.6 mmol/L (ref 3.5–5.1)
Sodium: 137 mmol/L (ref 135–145)
Total Bilirubin: 0.7 mg/dL (ref 0.3–1.2)
Total Protein: 7.4 g/dL (ref 6.5–8.1)

## 2023-02-17 LAB — CBC
HCT: 38.3 % (ref 36.0–46.0)
Hemoglobin: 12.8 g/dL (ref 12.0–15.0)
MCH: 30.1 pg (ref 26.0–34.0)
MCHC: 33.4 g/dL (ref 30.0–36.0)
MCV: 90.1 fL (ref 80.0–100.0)
Platelets: 278 10*3/uL (ref 150–400)
RBC: 4.25 MIL/uL (ref 3.87–5.11)
RDW: 11.5 % (ref 11.5–15.5)
WBC: 6.6 10*3/uL (ref 4.0–10.5)
nRBC: 0 % (ref 0.0–0.2)

## 2023-02-17 LAB — LIPASE, BLOOD: Lipase: 30 U/L (ref 11–51)

## 2023-02-17 MED ORDER — SODIUM CHLORIDE 0.9 % IV BOLUS
500.0000 mL | Freq: Once | INTRAVENOUS | Status: AC
  Administered 2023-02-17: 500 mL via INTRAVENOUS

## 2023-02-17 MED ORDER — DICYCLOMINE HCL 10 MG PO CAPS
10.0000 mg | ORAL_CAPSULE | Freq: Once | ORAL | Status: AC
  Administered 2023-02-17: 10 mg via ORAL
  Filled 2023-02-17: qty 1

## 2023-02-17 MED ORDER — IOHEXOL 350 MG/ML SOLN
75.0000 mL | Freq: Once | INTRAVENOUS | Status: AC | PRN
  Administered 2023-02-17: 75 mL via INTRAVENOUS

## 2023-02-17 MED ORDER — FENTANYL CITRATE PF 50 MCG/ML IJ SOSY
50.0000 ug | PREFILLED_SYRINGE | Freq: Once | INTRAMUSCULAR | Status: AC
  Administered 2023-02-17: 50 ug via INTRAVENOUS
  Filled 2023-02-17: qty 1

## 2023-02-17 MED ORDER — ONDANSETRON HCL 4 MG/2ML IJ SOLN
4.0000 mg | Freq: Four times a day (QID) | INTRAMUSCULAR | Status: DC | PRN
  Administered 2023-02-17: 4 mg via INTRAVENOUS
  Filled 2023-02-17: qty 2

## 2023-02-17 NOTE — ED Notes (Signed)
Called lab.  They just received labs.

## 2023-02-17 NOTE — Discharge Instructions (Signed)
Please proceed to the ER 

## 2023-02-17 NOTE — ED Triage Notes (Signed)
Pt c/o RLQ abdominal painx4d. Pt c/o N/V

## 2023-02-17 NOTE — ED Provider Notes (Addendum)
[facility]    CSN: [id number] Arrival date & time: 02/17/23  1002      History   Chief Complaint Chief Complaint  Patient presents with   Abdominal Pain    HPI [name] is a 57 y.o. female.    Abdominal Pain Here for right lower quadrant abdominal pain that began on August 7.  It has waxed and waned.  Currently it is about 7 out of 10, but has been more intense at times.  She is nauseated now and threw up once yesterday.  Last bowel movement was yesterday.  Normal brown and no blood or melena.  Temp at home has been 99  No URI symptoms. Past surgical history significant for hysterectomy and lap chole.  She still has her ovaries.   Past Medical History:  Diagnosis Date   Anemia    hx of prior to hysterectomy   BPPV (benign paroxysmal positional vertigo) 05/07/11   Described as room spinning, notes 2 episodes prior to Oct 2012.  Plans to undergo vestibular rehab.   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   COVID-19 virus infection 09/26/2021   Hypercalcemia 08/01/2021   Essential hypertension 04/20/2021   Recurrent diverticulitis s/p robotic sigmoid colectomy 12/07/2016 12/07/2016   Preventative health care 08/04/2014   Menopausal symptoms 09/18/2013   Class 2 obesity 03/12/2013   GERD (gastroesophageal reflux disease) 05/24/2011   PVC (premature ventricular contraction) 05/07/2011    Past Surgical History:   Procedure Laterality Date   BREAST BIOPSY Left    CERVICAL BIOPSY  W/ LOOP ELECTRODE EXCISION     CHOLECYSTECTOMY     COLON SURGERY  2017   sigmoidectomy   COLONOSCOPY     LAPAROSCOPIC CHOLECYSTECTOMY SINGLE SITE WITH INTRAOPERATIVE CHOLANGIOGRAM N/A 05/27/2015   Procedure: LAPAROSCOPIC CHOLECYSTECTOMY SINGLE SITE WITH INTRAOPERATIVE CHOLANGIOGRAM;  Surgeon: Karie Soda, MD;  Location: [facility];  Service: General;  Laterality: N/A;   PROCTOSCOPY N/A 12/07/2016   Procedure: RIGID PROCTOSCOPY;  Surgeon: Karie Soda, MD;  Location: [facility];  Service: General;  Laterality: N/A;   TONSILLECTOMY     UMBILICAL HERNIA REPAIR     Approximately 1974   VAGINAL HYSTERECTOMY  07/2010    OB History   No obstetric history on file.      Home Medications    Prior to Admission medications   Medication Sig Start Date End Date Taking? Authorizing Provider  hydrochlorothiazide (MICROZIDE) 12.5 MG capsule Take 1 capsule (12.5 mg total) by mouth daily. 04/17/22 04/17/23 Yes Rocky Morel, DO  cholecalciferol (VITAMIN D3) 25 MCG (1000 UNIT) tablet Take 1,000 Units by mouth daily.    [provider]  famotidine (PEPCID) 20 MG tablet Take 20 mg by mouth daily as needed for heartburn or indigestion. As needed    [provider]  ibuprofen (ADVIL,MOTRIN) 200 MG tablet Take 400 mg by mouth daily as needed for headache  or moderate pain.    [provider]  Multiple Vitamin (MULITIVITAMIN WITH MINERALS) TABS Take 1 tablet by mouth daily.    [provider]  omeprazole (PRILOSEC) 40 MG capsule TAKE 1 CAPSULE(40 MG) BY MOUTH DAILY 12/31/22   Rocky Morel, DO    Family History Family History  Problem Relation Age of Onset   Diabetes Mother    Breast cancer Mother    Deep vein thrombosis Father        died from cerebral embolus   Colon cancer Brother 35   Cervical cancer Maternal Aunt    Diabetes Maternal Grandmother    Esophageal cancer Neg Hx    Rectal cancer Neg  Hx    Stomach cancer Neg Hx    Colon polyps Neg Hx     Social History Social History   Tobacco Use   Smoking status: Former    Current packs/day: 0.00    Average packs/day: 0.1 packs/day for 20.0 years (2.0 ttl pk-yrs)    Types: Cigarettes    Start date: 07/09/1985    Quit date: 07/09/2005    Years since quitting: 17.6   Smokeless tobacco: Never   Tobacco comments:    1 black & mild a week  Vaping Use   Vaping status: Never Used  Substance Use Topics   Alcohol use: Not Currently    Comment: Glass of wine once every few months    Drug use: No     Allergies   Lisinopril, Morphine and codeine, and Latex   Review of Systems Review of Systems  Gastrointestinal:  Positive for abdominal pain.     Physical Exam Triage Vital Signs ED Triage Vitals  Encounter Vitals Group     BP 02/17/23 1014 120/79     Systolic BP Percentile --      Diastolic BP Percentile --      Pulse Rate 02/17/23 1014 81     Resp 02/17/23 1014 16     Temp 02/17/23 1014 99.4 F (37.4 C)     Temp Source 02/17/23 1014 Oral     SpO2 02/17/23 1014 95 %     Weight 02/17/23 1014 225 lb (102.1 kg)     Height 02/17/23 1014 5\' 6"  (1.676 m)     Head Circumference --      Peak Flow --      Pain Score 02/17/23 1013 7     Pain Loc --      Pain Education --      Exclude from Growth Chart --    No data found.  Updated Vital Signs BP 120/79 (BP Location: Left Arm)   Pulse 81   Temp 99.4 F (37.4 C) (Oral)   Resp 16   Ht 5\' 6"  (1.676 m)   Wt 102.1 kg   SpO2 95%   BMI 36.32 kg/m   Visual Acuity Right Eye Distance:   Left Eye Distance:   Bilateral Distance:    Right Eye Near:   Left Eye Near:    Bilateral Near:     Physical Exam Vitals reviewed.  Constitutional:      Appearance: She is not ill-appearing, toxic-appearing or diaphoretic.     Comments: In no acute respiratory distress, but she has pained facies  HENT:     Right Ear: Tympanic membrane and ear canal normal.     Left Ear:  Tympanic membrane and ear canal normal.     Nose: Nose normal.     Mouth/Throat:  Mouth: Mucous membranes are moist.     Pharynx: No oropharyngeal exudate or posterior oropharyngeal erythema.  Eyes:     Extraocular Movements: Extraocular movements intact.     Conjunctiva/sclera: Conjunctivae normal.     Pupils: Pupils are equal, round, and reactive to light.  Cardiovascular:     Rate and Rhythm: Normal rate and regular rhythm.     Heart sounds: No murmur heard. Pulmonary:     Effort: Pulmonary effort is normal. No respiratory distress.     Breath sounds: No stridor. No wheezing, rhonchi or rales.  Abdominal:     Palpations: Abdomen is soft.     Tenderness: There is abdominal tenderness (There is pronounced tenderness in the right lower quadrant, though there is some milder tenderness in the whole abdomen.).  Musculoskeletal:     Cervical back: Neck supple.  Lymphadenopathy:     Cervical: No cervical adenopathy.  Skin:    Capillary Refill: Capillary refill takes less than 2 seconds.     Coloration: Skin is not jaundiced or pale.  Neurological:     General: No focal deficit present.     Mental Status: She is alert and oriented to person, place, and time.  Psychiatric:        Behavior: Behavior normal.      UC Treatments / Results  Labs (all labs ordered are listed, but only abnormal results are displayed) Labs Reviewed - No data to display  EKG   Radiology No results found.  Procedures Procedures (including critical care time)  Medications Ordered in UC Medications - No data to display  Initial Impression / Assessment and Plan / UC Course  I have reviewed the triage vital signs and the nursing notes.  Pertinent labs & imaging results that were available during my care of the patient were reviewed by me and considered in my medical decision making (see chart for details).        I have asked her to proceed to the emergency room for higher level of  evaluation and treatment then we can provide here in the urgent care setting.  She is agreeable and will go by private car Final Clinical Impressions(s) / UC Diagnoses   Final diagnoses:  Right lower quadrant pain     Discharge Instructions      Please proceed to the ER     ED Prescriptions   None    PDMP not reviewed this encounter.   Zenia Resides, MD 02/17/23 1036    Zenia Resides, MD 02/17/23 1037

## 2023-02-17 NOTE — ED Provider Notes (Signed)
[facility] Provider Note   CSN: [id number] Arrival date & time: 02/17/23  1048     History  Chief Complaint  Patient presents with   Abdominal Pain    [name] is a 57 y.o. female with PMHx HTN, PVC, Diverticulosis, GERD who presents to ED concerned for nausea, vomiting, diarrhea, and RLQ abdominal pain. Patient with non bloody diarrhea 5 days ago which resolved. Patient then developed RLQ abdominal pain 4 days ago that is described as mild constant dull aching with intermittent sharp stabbing sensations. Patient also with nausea over the past 4 days. Patient with one episode of non bloody vomiting yesterday. Patient seen at UC earlier today and referred to ED.  Patient with surgical history of umbilical hernia repair, hysterectomy, and cholecystectomy.   Denies dyspnea, vaginal discharge/bleeding, dysuria, hematuria, hematochezia, hematemesis.   Abdominal Pain      Home Medications Prior to Admission medications   Medication Sig Start Date End Date Taking? Authorizing Provider  cholecalciferol (VITAMIN D3) 25 MCG (1000 UNIT) tablet Take 1,000 Units by mouth daily.    [provider]  famotidine (PEPCID) 20 MG tablet Take 20 mg by mouth daily as needed for heartburn or indigestion. As needed    [provider]  hydrochlorothiazide (MICROZIDE) 12.5 MG capsule Take 1 capsule (12.5 mg total) by mouth daily. 04/17/22 04/17/23  Rocky Morel, DO  ibuprofen (ADVIL,MOTRIN) 200 MG tablet Take 400 mg by mouth daily as needed for headache or moderate pain.    [provider]  Multiple Vitamin (MULITIVITAMIN WITH MINERALS) TABS Take 1 tablet by mouth daily.    [provider]  omeprazole (PRILOSEC) 40 MG capsule TAKE 1 CAPSULE(40 MG) BY MOUTH DAILY 12/31/22   Rocky Morel, DO      Allergies    Lisinopril, Morphine and codeine, and Latex    Review of Systems   Review of Systems   Gastrointestinal:  Positive for abdominal pain.    Physical Exam Updated Vital Signs BP (!) 159/96 (BP Location: Right Arm)   Pulse 99   Temp 99.3 F (37.4 C) (Oral)   Resp 16   Ht 5\' 6"  (1.676 m)   Wt 102.1 kg   SpO2 97%   BMI 36.33 kg/m  Physical Exam Vitals and nursing note reviewed.  Constitutional:      General: She is not in acute distress.    Appearance: She is not ill-appearing or toxic-appearing.  HENT:     Head: Normocephalic and atraumatic.     Mouth/Throat:     Mouth: Mucous membranes are moist.     Pharynx: No posterior oropharyngeal erythema.  Eyes:     General: No scleral icterus.       Right eye: No discharge.        Left eye: No discharge.     Conjunctiva/sclera: Conjunctivae normal.  Cardiovascular:     Rate and Rhythm: Normal rate and regular rhythm.     Pulses: Normal pulses.     Heart sounds: Normal heart sounds. No murmur heard. Pulmonary:     Effort: Pulmonary effort is normal.  Abdominal:     General: Abdomen is flat. Bowel sounds are normal. There is no distension.     Palpations: Abdomen is soft. There is no mass.     Comments: Tenderness to palpation of RLQ.   Musculoskeletal:     Right lower leg: No edema.     Left lower leg: No edema.  Skin:    General: Skin is warm and dry.     Findings: No rash.  Neurological:     General: No focal deficit present.     Mental Status: She is alert. Mental status is at baseline.  Psychiatric:        Mood and Affect: Mood normal.     ED Results / Procedures / Treatments   Labs (all labs ordered are listed, but only abnormal results are displayed) Labs Reviewed  COMPREHENSIVE METABOLIC PANEL - Abnormal; Notable for the following components:      Result Value   CO2 21 (*)    Glucose, Bld 153 (*)    All other components within normal limits  URINALYSIS, ROUTINE W REFLEX MICROSCOPIC - Abnormal; Notable for the following components:   APPearance HAZY (*)    Leukocytes,Ua TRACE (*)    Bacteria,  UA RARE (*)    All other components within normal limits  LIPASE, BLOOD  CBC    EKG None  Radiology CT ABDOMEN PELVIS W CONTRAST  Result Date: 02/17/2023 CLINICAL DATA:  RLQ abdominal pain EXAM: CT ABDOMEN AND PELVIS WITH CONTRAST TECHNIQUE: Multidetector CT imaging of the abdomen and pelvis was performed using the standard protocol following bolus administration of intravenous contrast. RADIATION DOSE REDUCTION: This exam was performed according to the departmental dose-optimization program which includes automated exposure control, adjustment of the mA and/or kV according to patient size and/or use of iterative reconstruction technique. CONTRAST:  75mL OMNIPAQUE IOHEXOL 350 MG/ML SOLN COMPARISON:  09/18/2016 CT abdomen/pelvis FINDINGS: Lower chest: No significant pulmonary nodules or acute consolidative airspace disease. Hepatobiliary: Normal liver size. No liver mass. Cholecystectomy. No biliary ductal dilatation. Pancreas: Normal, with no mass or duct dilation. Spleen: Normal size. No mass. Adrenals/Urinary Tract: Normal adrenals. Symmetric normal contrast nephrograms. No contour deforming renal masses. No hydronephrosis. Normal bladder. Stomach/Bowel: Normal non-distended stomach. Normal caliber small bowel with no small bowel wall thickening. Normal appendix. Status post partial distal colectomy with intact appearing distal colonic anastomosis. Mild left colonic diverticulosis with no large bowel wall thickening or significant pericolonic fat stranding. Vascular/Lymphatic: Atherosclerotic abdominal aorta with 3.2 cm infrarenal abdominal aortic aneurysm. Patent portal, splenic, hepatic and renal veins. No pathologically enlarged lymph nodes in the abdomen or pelvis. Reproductive: Status post hysterectomy, with no abnormal findings at the vaginal cuff. No adnexal mass. Other: No pneumoperitoneum, ascites or focal fluid collection. Musculoskeletal: No aggressive appearing focal osseous lesions. Mild  thoracolumbar spondylosis. IMPRESSION: 1. No acute abnormality. No evidence of bowel obstruction or acute bowel inflammation. Mild left colonic diverticulosis, with no evidence of acute diverticulitis. Normal appendix. 2. Infrarenal 3.2 cm abdominal aortic aneurysm. Recommend follow-up ultrasound every 3 years. 3.  Aortic Atherosclerosis (ICD10-I70.0). Electronically Signed   By: Delbert Phenix M.D.   On: 02/17/2023 13:13    Procedures Procedures    Medications Ordered in ED Medications  ondansetron (ZOFRAN) injection 4 mg (4 mg Intravenous Given 02/17/23 1220)  sodium chloride 0.9 % bolus 500 mL (has no administration in time range)  dicyclomine (BENTYL) capsule 10 mg (has no administration in time range)  fentaNYL (SUBLIMAZE) injection 50 mcg (50 mcg Intravenous Given 02/17/23 1221)  iohexol (OMNIPAQUE) 350 MG/ML injection 75 mL (75 mLs Intravenous Contrast Given 02/17/23 1248)    ED Course/ Medical Decision Making/ A&P                                 Medical  Decision Making Amount and/or Complexity of Data Reviewed Labs: ordered. Radiology: ordered.  Risk Prescription drug management.    This patient presents to the ED for concern of abdominal pain, this involves an extensive number of treatment options, and is a complaint that carries with it a high risk of complications and morbidity.  The differential diagnosis includes gastroenteritis, colitis, small bowel obstruction, appendicitis, cholecystitis, pancreatitis, nephrolithiasis, UTI, pyleonephritis, ruptured ectopic pregnancy, PID, ovarian torsion.   Co morbidities that complicate the patient evaluation  HTN, PVC, Diverticulosis, GERD     Lab Tests:  I Ordered, and personally interpreted labs.  The pertinent results include:   CBC with differential: No concern for anemia or leukocytosis CMP: no concern for electrolyte abnormality; no concern for kidney/liver damage Lipase: within normal limits UA: not concerning for  infection   Imaging Studies ordered:  I ordered imaging studies including   -CT Abd/Pelvis with contrast: evaluate for structural/surgical etiology of patients' severe abdominal pain.  I independently visualized and interpreted imaging I agree with the radiologist interpretation    Problem List / ED Course / Critical interventions / Medication management  Patient presented for abdominal pain, nausea, vomiting, diarrhea x5 days.  Physical exam with tenderness to palpation of RLQ.  Patient mildly tachycardic so I provided her with IV fluids.  Fentanyl for pain control and Zofran for nausea. Lipase within normal limits.  CBC without leukocytosis or anemia.  CMP without electrolyte abnormalities.  BUN/Cr and liver function panel within normal limits.  UA not concerning for infection and patient denies urinary complaints. CT abdomen pelvis without acute abdominal process that would explain patient's symptoms. CT showing 3.2 cm AAA and recommended follow up. Shared information with patient who endorsed understanding of plan. Shared results with patient and offered to prescribe her some Bentyl to see if it helps control her abdominal discomfort.  Patient stating that she would just rather go home right now.  Patient stating that she would rather take ibuprofen and Tylenol at home for her pain.  Recommended following up with her primary care provider.  Patient verbalized understanding of plan. I have reviewed the patients home medicines and have made adjustments as needed Patient was given return precautions. Patient stable for discharge at this time.  Patient verbalized understanding of plan.  Ddx: These are considered less likely due to history of present illness and physical exam. -gastroenteritis: No vomiting in ED; no fever; tolerating PO intake -colitis: patient afebrile, CT without concern -small bowel obstruction: Last BM yesterday -appendicitis: CT without concern -cholecystitis: Negative  Murphy sign; liver enzymes within normal limits  -pancreatitis: No LUQ tenderness to palpation, lipase within normal limits  -nephrolithiasis: Denies flank pain and urinary complaints  -UTI/pyelonephritis: Denies urinary complaints  -ruptured ectopic pregnancy,  - PID, ovarian torsion: denies pelvic pain, vaginal discharge/bleeding; CT without concern   Social Determinants of Health:  none           Final Clinical Impression(s) / ED Diagnoses Final diagnoses:  RLQ abdominal pain    Rx / DC Orders ED Discharge Orders     None         Dorthy Cooler, [place] 02/17/23 1358    Glendora Score, MD 02/17/23 1816

## 2023-02-17 NOTE — ED Triage Notes (Signed)
Patient here today with c/o RLQ abd pain since Wednesday. She vomited yesterday. Has had loss of appetite. She tried taking Gas X with no relief.

## 2023-02-17 NOTE — ED Notes (Signed)
Patient is being discharged from the Urgent Care and sent to the Emergency Department via self . Per provider, patient is in need of higher level of care due to RLQ abd pain. Patient is aware and verbalizes understanding of plan of care.  Vitals:   02/17/23 1014  BP: 120/79  Pulse: 81  Resp: 16  Temp: 99.4 F (37.4 C)  SpO2: 95%

## 2023-02-17 NOTE — Discharge Instructions (Addendum)
It was a pleasure caring for you today.  CT was not showing any acute processes that would be explaining your symptoms.  I recommend following up with primary care provider.  Seek care if experiencing any new or worsening symptoms

## 2023-02-19 ENCOUNTER — Telehealth (INDEPENDENT_AMBULATORY_CARE_PROVIDER_SITE_OTHER): Payer: 59 | Admitting: Acute Care

## 2023-02-19 ENCOUNTER — Encounter: Payer: Self-pay | Admitting: Acute Care

## 2023-02-19 VITALS — BP 120/80 | HR 98 | Ht 66.0 in | Wt 231.0 lb

## 2023-02-19 DIAGNOSIS — Z09 Encounter for follow-up examination after completed treatment for conditions other than malignant neoplasm: Secondary | ICD-10-CM | POA: Diagnosis not present

## 2023-02-19 DIAGNOSIS — Z87891 Personal history of nicotine dependence: Secondary | ICD-10-CM | POA: Diagnosis not present

## 2023-02-19 DIAGNOSIS — R911 Solitary pulmonary nodule: Secondary | ICD-10-CM

## 2023-02-19 NOTE — Progress Notes (Signed)
   Virtual Visit via Telephone  Note  I connected with [name] on 02/19/23 at 11:30 AM EDT by a telephone enabled telemedicine application and verified that I am speaking with the correct person using two identifiers.  Location: Patient:  At home Provider: [address], [place], [place], [address]    I discussed the limitations of evaluation and management by telemedicine and the availability of in person appointments. The patient expressed understanding and agreed to proceed.  History of Present Illness: Pt. Presents for follow up after Low Dose CT Chest. This was a 3 month follow up after the finding of an 8 mm pulmonary nodule noted in her left lower lobe in April 2024.    Observations/Objective: 02/15/2023 Low Dose CT Chest Lungs/Pleura: Centrilobular emphsyema noted. 2.9 mm nodule identified right upper lobe on image 56. The 8 mm posterior left lower lobe subpleural nodule identified on the recent standard CT chest has resolved in the interval consistent with atelectasis. There is no new suspicious pulmonary nodule or mass. No focal airspace consolidation. No pleural effusion. Lung-RADS 2, benign appearance or behavior. Continue annual screening with low-dose chest CT without contrast in 12 months.    CT Chest 10/16/2022 Lungs/Pleura: Pleural-based left lower lobe nodule measures 9 mm on axial image 48. Fleischner Society guidelines suggest consideration of three-month CT follow up, PET scan or tissue diagnosis based on the size of the nodule.   Tiny pleural-based nodules identified bilaterally measuring couple of mm. On axial image 84 there is a left lower lobe 3 mm nodule. No pneumonia or pulmonary edema. No pneumothorax or pleural effusion. Left lower lobe pleural-based nodule measures up to 8 mm. Additional smaller pleural-based nodules observed bilaterally. Based on the literature, consideration should be given to three-month follow up CT, PET scan or  tissue diagnosis.   Assessment and Plan: Resolved left lower lobe pulmonary nodule Former smoker  Plan It was good to talk with you today. Your follow up scan shows resolution of the 8 mm left lower lobe pulmonary nodule. This is great news We will continue screening you through the lung cancer screening program. Your next scan will be due 02/2024.  We will call you closed to the time to get this scheduled.    Follow Up Instructions: We will continue screening you through the lung cancer screening program. Your next scan will be due 02/2024.  We will call you closed to the time to get this scheduled.    I discussed the assessment and treatment plan with the patient. The patient was provided an opportunity to ask questions and all were answered. The patient agreed with the plan and demonstrated an understanding of the instructions.   The patient was advised to call back or seek an in-person evaluation if the symptoms worsen or if the condition fails to improve as anticipated.  I provided 30 minutes of non-face-to-face time during this encounter.   Bevelyn Ngo, NP  02/19/2023

## 2023-02-19 NOTE — Patient Instructions (Signed)
t was good to talk with you today. Your follow up scan shows resolution of the 8 mm left lower lobe pulmonary nodule. This is great news We will continue screening you through the lung cancer screening program. Your next scan will be due 02/2024.  We will call you closed to the time to get this scheduled.  Please contact office for sooner follow up if symptoms do not improve or worsen or seek emergency care

## 2023-02-20 ENCOUNTER — Other Ambulatory Visit: Payer: Self-pay

## 2023-02-20 DIAGNOSIS — Z122 Encounter for screening for malignant neoplasm of respiratory organs: Secondary | ICD-10-CM

## 2023-02-20 DIAGNOSIS — Z87891 Personal history of nicotine dependence: Secondary | ICD-10-CM

## 2023-04-09 ENCOUNTER — Encounter: Payer: Self-pay | Admitting: Student

## 2023-04-09 ENCOUNTER — Ambulatory Visit: Payer: 59 | Admitting: Internal Medicine

## 2023-04-09 VITALS — BP 124/88 | HR 86 | Ht 66.0 in | Wt 225.1 lb

## 2023-04-09 DIAGNOSIS — R911 Solitary pulmonary nodule: Secondary | ICD-10-CM

## 2023-04-09 DIAGNOSIS — I10 Essential (primary) hypertension: Secondary | ICD-10-CM

## 2023-04-09 DIAGNOSIS — I714 Abdominal aortic aneurysm, without rupture, unspecified: Secondary | ICD-10-CM | POA: Diagnosis not present

## 2023-04-09 DIAGNOSIS — I7143 Infrarenal abdominal aortic aneurysm, without rupture: Secondary | ICD-10-CM

## 2023-04-09 DIAGNOSIS — Z0000 Encounter for general adult medical examination without abnormal findings: Secondary | ICD-10-CM

## 2023-04-09 DIAGNOSIS — E66812 Obesity, class 2: Secondary | ICD-10-CM

## 2023-04-09 NOTE — Assessment & Plan Note (Signed)
Declined flu vaccine today. She believes she has had a tdap in the last 10 years.

## 2023-04-09 NOTE — Progress Notes (Signed)
56 year old female with HTN, GERD, pulmonary nodule  Last A1c 04/2021 at 5.6 Pulmonary nodule resolved, rescan 02/2024

## 2023-04-09 NOTE — Assessment & Plan Note (Signed)
Had updated CT in August 2024 that showed the 8 mm posterior left lower lobe subpleural nodule identified on the recent standard CT chest has resolved. Will continue with annual CT screening per pulmonology.

## 2023-04-09 NOTE — Assessment & Plan Note (Signed)
Blood pressure is well controlled at 124/88. She takes hydrochlorothiazide 12.5 mg daily. No changes made to regimen, however, I have referred the patient to the provider referral exercise program, as she is interested in losing weight and exercise. She is interested in the program to have some accountability with her exercise regimen.

## 2023-04-09 NOTE — Patient Instructions (Signed)
Thank you, [name] for allowing [place] to provide your care today. Today we discussed:  High blood pressure Continue to take hydrochlorothiazide everyday I have referred you to the exercise program to hopefully help with your weight loss journey, which will also help your blood pressure!  Abdominal aortic aneurysm We will get a follow up scan in one year to see if it is growing in size  Lung nodule Follow up in one year per pulmonology   I have ordered the following labs for you:  Lab Orders  No laboratory test(s) ordered today      Referrals ordered today:    Referral Orders         Amb Referral To Provider Referral Exercise Program (P.R.E.P)      I have ordered the following medication/changed the following medications:   Stop the following medications: Medications Discontinued During This Encounter  Medication Reason   omeprazole (PRILOSEC) 40 MG capsule      Start the following medications: No orders of the defined types were placed in this encounter.    Follow up: 6 months    Should you have any questions or concerns please call the internal medicine clinic at [phone].     [name], D.O. [facility]

## 2023-04-09 NOTE — Progress Notes (Signed)
CC: routine follow up  HPI:  [name] is a 57 y.o. female living with a history stated below and presents today for a routine follow up of her HTN. Please see problem based assessment and plan for additional details.  Past Medical History:  Diagnosis Date   Anemia    hx of prior to hysterectomy   BPPV (benign paroxysmal positional vertigo) 05/07/2011   Described as room spinning, notes 2 episodes prior to Oct 2012.  Plans to undergo vestibular rehab.   Diverticulitis    with microperforation   Diverticulosis    Ear ache 03/12/2013   GERD (gastroesophageal reflux disease)    History of hypertension 05/07/2011   Hypercalcemia 08/01/2021   Hyperplastic colon polyp    Hypertension 05/07/2011   with grade 2 diastolic dysfunction not on meds x 3 years    Internal hemorrhoids    PVC (premature ventricular contraction) 05/07/2011   incidentally seen on telemetry   Tubular adenoma of colon     Current Outpatient Medications on File Prior to Visit  Medication Sig Dispense Refill   cholecalciferol (VITAMIN D3) 25 MCG (1000 UNIT) tablet Take 1,000 Units by mouth daily.     famotidine (PEPCID) 20 MG tablet Take 20 mg by mouth daily as needed for heartburn or indigestion. As needed     hydrochlorothiazide (MICROZIDE) 12.5 MG capsule Take 1 capsule (12.5 mg total) by mouth daily. 90 capsule 3   ibuprofen (ADVIL,MOTRIN) 200 MG tablet Take 400 mg by mouth daily as needed for headache or moderate pain.     Multiple Vitamin (MULITIVITAMIN WITH MINERALS) TABS Take 1 tablet by mouth daily.     No current facility-administered medications on file prior to visit.    Family History  Problem Relation Age of Onset   Diabetes Mother    Breast cancer Mother    Deep vein thrombosis Father        died from cerebral embolus   Colon cancer Brother 57   Cervical cancer Maternal Aunt    Diabetes Maternal Grandmother    Esophageal cancer Neg Hx    Rectal cancer Neg Hx    Stomach  cancer Neg Hx    Colon polyps Neg Hx     Social History   Socioeconomic History   Marital status: Single    Spouse name: Not on file   Number of children: 2   Years of education: Not on file   Highest education level: Not on file  Occupational History    Employer: [employer]  Tobacco Use   Smoking status: Former    Current packs/day: 0.00    Average packs/day: 0.1 packs/day for 20.0 years (2.0 ttl pk-yrs)    Types: Cigarettes    Start date: 07/09/1985    Quit date: 07/09/2005    Years since quitting: 17.7   Smokeless tobacco: Never   Tobacco comments:    1 black & mild a week  Vaping Use   Vaping status: Never Used  Substance and Sexual Activity   Alcohol use: Not Currently    Comment: Glass of wine once every few months    Drug use: No   Sexual activity: Yes    Birth control/protection: None  Other Topics Concern   Not on file  Social History Narrative   Has two children and one granddaughter. Lives by herself.   Social Determinants of Health   Financial Resource Strain: Not on file  Food Insecurity: Not on file  Transportation Needs:  Not on file  Physical Activity: Not on file  Stress: Not on file  Social Connections: Not on file  Intimate Partner Violence: Not on file    Review of Systems: ROS negative except for what is noted on the assessment and plan.  Vitals:   04/09/23 1519 04/09/23 1613  BP: (!) 150/86 124/88  Pulse: (!) 105 86  SpO2: 97%   Weight: 225 lb 1.6 oz (102.1 kg)   Height: 5\' 6"  (1.676 m)     Physical Exam: Constitutional: well-appearing middle aged female sitting in chair, in no acute distress Cardiovascular: regular rate and rhythm, no m/r/g Pulmonary/Chest: normal work of breathing on room air, lungs clear to auscultation bilaterally MSK: normal bulk and tone Skin: warm and dry Psych: normal mood and behavior  Assessment & Plan:   Patient discussed with Dr. Mayford Knife  AAA (abdominal aortic aneurysm) [facility]) The patient was  incidentally found to have an infrarenal abdominal aortic aneurysm of 3.2 cm on CT imaging in August. Given her history of HTN and former tobacco use, I would recommend a repeat scan in one year.   Essential hypertension Blood pressure is well controlled at 124/88. She takes hydrochlorothiazide 12.5 mg daily. No changes made to regimen, however, I have referred the patient to the provider referral exercise program, as she is interested in losing weight and exercise. She is interested in the program to have some accountability with her exercise regimen.   Lung nodule seen on imaging study Had updated CT in August 2024 that showed the 8 mm posterior left lower lobe subpleural nodule identified on the recent standard CT chest has resolved. Will continue with annual CT screening per pulmonology.   Healthcare maintenance Declined flu vaccine today. She believes she has had a tdap in the last 10 years.    Elza Rafter, D.O. [facility], PGY-3 Phone: [phone] Date 04/09/2023 Time 4:22 PM

## 2023-04-09 NOTE — Assessment & Plan Note (Signed)
The patient was incidentally found to have an infrarenal abdominal aortic aneurysm of 3.2 cm on CT imaging in August. Given her history of HTN and former tobacco use, I would recommend a repeat scan in one year.

## 2023-04-24 ENCOUNTER — Other Ambulatory Visit: Payer: Self-pay | Admitting: Student

## 2023-04-24 DIAGNOSIS — I10 Essential (primary) hypertension: Secondary | ICD-10-CM

## 2023-04-26 NOTE — Progress Notes (Signed)
Internal Medicine Clinic Attending  Case discussed with the resident at the time of the visit.  We reviewed the resident's history and exam and pertinent patient test results.  I agree with the assessment, diagnosis, and plan of care documented in the resident's note.  

## 2023-07-15 NOTE — Telephone Encounter (Signed)
 error

## 2024-01-20 ENCOUNTER — Other Ambulatory Visit: Payer: Self-pay | Admitting: Internal Medicine

## 2024-01-20 DIAGNOSIS — Z1231 Encounter for screening mammogram for malignant neoplasm of breast: Secondary | ICD-10-CM

## 2024-02-13 ENCOUNTER — Ambulatory Visit
Admission: RE | Admit: 2024-02-13 | Discharge: 2024-02-13 | Disposition: A | Discharge status: Home / Self Care | Source: Ambulatory Visit | Attending: Internal Medicine | Admitting: Internal Medicine

## 2024-02-13 ENCOUNTER — Encounter: Payer: Self-pay | Admitting: Acute Care

## 2024-02-13 DIAGNOSIS — Z1231 Encounter for screening mammogram for malignant neoplasm of breast: Secondary | ICD-10-CM

## 2024-02-26 ENCOUNTER — Other Ambulatory Visit: Payer: Self-pay | Admitting: Acute Care

## 2024-02-26 DIAGNOSIS — Z87891 Personal history of nicotine dependence: Secondary | ICD-10-CM

## 2024-02-26 DIAGNOSIS — Z122 Encounter for screening for malignant neoplasm of respiratory organs: Secondary | ICD-10-CM

## 2024-03-10 ENCOUNTER — Ambulatory Visit
Admission: RE | Admit: 2024-03-10 | Discharge: 2024-03-10 | Disposition: A | Discharge status: Home / Self Care | Source: Ambulatory Visit | Attending: Acute Care | Admitting: Acute Care

## 2024-03-10 DIAGNOSIS — Z122 Encounter for screening for malignant neoplasm of respiratory organs: Secondary | ICD-10-CM

## 2024-03-10 DIAGNOSIS — Z87891 Personal history of nicotine dependence: Secondary | ICD-10-CM

## 2024-03-18 ENCOUNTER — Other Ambulatory Visit: Payer: Self-pay

## 2024-03-18 DIAGNOSIS — Z122 Encounter for screening for malignant neoplasm of respiratory organs: Secondary | ICD-10-CM

## 2024-03-18 DIAGNOSIS — Z87891 Personal history of nicotine dependence: Secondary | ICD-10-CM

## 2024-03-26 ENCOUNTER — Other Ambulatory Visit: Payer: Self-pay

## 2024-03-26 DIAGNOSIS — I10 Essential (primary) hypertension: Secondary | ICD-10-CM

## 2024-03-26 MED ORDER — HYDROCHLOROTHIAZIDE 12.5 MG PO CAPS
12.5000 mg | ORAL_CAPSULE | Freq: Every day | ORAL | 3 refills | Status: AC
Start: 2024-03-26 — End: ?

## 2024-03-26 NOTE — Telephone Encounter (Signed)
 Medication sent to pharmacy

## 2024-03-27 ENCOUNTER — Ambulatory Visit: Payer: Self-pay | Admitting: *Deleted

## 2024-03-27 NOTE — Telephone Encounter (Signed)
 Spoke to patient did a sugar on yesterday 166.  Was not a fasting.  Only had coffee earlier that day.  Is having frequency and thirst.  Stated did have some nausea and vomiting 1 day.  Is drinking plenty of fluids. Refuses to go to an Urgent Care.  Will watch diet and drink plenty of fluids.  Daughter and Daughter in-law are Diabetic and will continue to take sugars until appointment on 04/06/2024. If elevation continues will consider going to  an Urgent Care before her appointment

## 2024-03-27 NOTE — Telephone Encounter (Signed)
 FYI Only or Action Required?: FYI only for provider.  Patient was last seen in primary care on 04/09/2023 by Atway, Rayann N, DO.  Called Nurse Triage reporting Hyperglycemia.  Symptoms began yesterday.  Interventions attempted: Nothing.  Symptoms are: unchanged.  Triage Disposition: Call PCP Now  Patient/caregiver understands and will follow disposition?: Call transferred to CAL for scheduling  Reason for Disposition  [1] Symptoms of high blood sugar (e.g., increased thirst, frequent urination, weight loss) AND [2] not able to test blood glucose AND [3] pregnant  Answer Assessment - Initial Assessment Questions 1. BLOOD GLUCOSE: What is your blood glucose level?      This morning having headache 2. ONSET: When did you check the blood glucose?     Wednesday am- 156, last night-166 3. USUAL RANGE: What is your glucose level usually? (e.g., usual fasting morning value, usual evening value)     unknown 4. KETONES: Do you check for ketones (urine or blood test strips)? If Yes, ask: What does the test show now?      na 5. TYPE 1 or 2:  Do you know what type of diabetes you have?  (e.g., Type 1, Type 2, Gestational; doesn't know)      no 6. INSULIN: Do you take insulin? What type of insulin(s) do you use? What is the mode of delivery? (syringe, pen; injection or pump)?      no 7. DIABETES PILLS: Do you take any pills for your diabetes? If Yes, ask: Have you missed taking any pills recently?     no 8. OTHER SYMPTOMS: Do you have any symptoms? (e.g., fever, frequent urination, difficulty breathing, dizziness, weakness, vomiting)     Disoriented feeling- almost like drunk, headache- off/on, nausea/vomited  Protocols used: Diabetes - High Blood Sugar-A-AH   Copied from CRM [id number]. Topic: Clinical - Red Word Triage >> Mar 27, 2024 10:02 AM Susanna ORN wrote: Red Word that prompted transfer to Nurse Triage: Patient called to check if there was a sooner appt than  what she already has but there isn't. Added appt to waitlist. Patient states her blood sugar was high and last night it was higher. She states that she's not diabetic.

## 2024-03-30 ENCOUNTER — Ambulatory Visit (INDEPENDENT_AMBULATORY_CARE_PROVIDER_SITE_OTHER): Admitting: Student

## 2024-03-30 ENCOUNTER — Encounter: Payer: Self-pay | Admitting: Student

## 2024-03-30 ENCOUNTER — Telehealth: Payer: Self-pay

## 2024-03-30 ENCOUNTER — Ambulatory Visit

## 2024-03-30 VITALS — BP 136/91 | HR 85 | Temp 98.1°F | Ht 66.0 in | Wt 222.6 lb

## 2024-03-30 DIAGNOSIS — I10 Essential (primary) hypertension: Secondary | ICD-10-CM | POA: Diagnosis not present

## 2024-03-30 DIAGNOSIS — R7303 Prediabetes: Secondary | ICD-10-CM | POA: Insufficient documentation

## 2024-03-30 DIAGNOSIS — R42 Dizziness and giddiness: Secondary | ICD-10-CM | POA: Diagnosis not present

## 2024-03-30 DIAGNOSIS — E66812 Obesity, class 2: Secondary | ICD-10-CM

## 2024-03-30 LAB — POCT GLYCOSYLATED HEMOGLOBIN (HGB A1C): Hemoglobin A1C: 5.8 % — AB (ref 4.0–5.6)

## 2024-03-30 LAB — GLUCOSE, CAPILLARY: Glucose-Capillary: 173 mg/dL — ABNORMAL HIGH (ref 70–99)

## 2024-03-30 NOTE — Progress Notes (Signed)
 CC: blood sugar  HPI: [name] is a 58 y.o. female living with a history stated below and presents today for blood sugar. Please see problem based assessment and plan for additional details.  Past Medical History:  Diagnosis Date   Anemia    hx of prior to hysterectomy   BPPV (benign paroxysmal positional vertigo) 05/07/2011   Described as room spinning, notes 2 episodes prior to Oct 2012.  Plans to undergo vestibular rehab.   Diverticulitis    with microperforation   Diverticulosis    Ear ache 03/12/2013   GERD (gastroesophageal reflux disease)    History of hypertension 05/07/2011   Hypercalcemia 08/01/2021   Hyperplastic colon polyp    Hypertension 05/07/2011   with grade 2 diastolic dysfunction not on meds x 3 years    Internal hemorrhoids    PVC (premature ventricular contraction) 05/07/2011   incidentally seen on telemetry   Tubular adenoma of colon     Current Outpatient Medications on File Prior to Visit  Medication Sig Dispense Refill   cholecalciferol (VITAMIN D3) 25 MCG (1000 UNIT) tablet Take 1,000 Units by mouth daily.     famotidine  (PEPCID ) 20 MG tablet Take 20 mg by mouth daily as needed for heartburn or indigestion. As needed     hydrochlorothiazide  (MICROZIDE ) 12.5 MG capsule Take 1 capsule (12.5 mg total) by mouth daily. 90 capsule 3   ibuprofen  (ADVIL ,MOTRIN ) 200 MG tablet Take 400 mg by mouth daily as needed for headache or moderate pain.     Multiple Vitamin (MULITIVITAMIN WITH MINERALS) TABS Take 1 tablet by mouth daily.     No current facility-administered medications on file prior to visit.    Family History  Problem Relation Age of Onset   Diabetes Mother    Breast cancer Mother    Deep vein thrombosis Father        died from cerebral embolus   Colon cancer Brother 62   Cervical cancer Maternal Aunt    Diabetes Maternal Grandmother    Esophageal cancer Neg Hx    Rectal cancer Neg Hx    Stomach cancer Neg Hx    Colon polyps  Neg Hx     Social History   Socioeconomic History   Marital status: Single    Spouse name: Not on file   Number of children: 2   Years of education: Not on file   Highest education level: Some college, no degree  Occupational History    Employer: Network engineer  Tobacco Use   Smoking status: Former    Current packs/day: 0.00    Average packs/day: 0.1 packs/day for 20.0 years (2.0 ttl pk-yrs)    Types: Cigarettes    Start date: 07/09/1985    Quit date: 07/09/2005    Years since quitting: 18.7   Smokeless tobacco: Never   Tobacco comments:    1 black & mild a week  Vaping Use   Vaping status: Never Used  Substance and Sexual Activity   Alcohol use: Not Currently    Comment: Glass of wine once every few months    Drug use: No   Sexual activity: Yes    Birth control/protection: None  Other Topics Concern   Not on file  Social History Narrative   Has two children and one granddaughter. Lives by herself.   Social Drivers of Corporate investment banker Strain: Low Risk  (03/29/2024)   Overall Financial Resource Strain (CARDIA)    Difficulty of Paying Living  Expenses: Not hard at all  Food Insecurity: No Food Insecurity (03/29/2024)   Hunger Vital Sign    Worried About Running Out of Food in the Last Year: Never true    Ran Out of Food in the Last Year: Never true  Transportation Needs: No Transportation Needs (03/29/2024)   PRAPARE - Administrator, Civil Service (Medical): No    Lack of Transportation (Non-Medical): No  Physical Activity: Sufficiently Active (03/29/2024)   Exercise Vital Sign    Days of Exercise per Week: 5 days    Minutes of Exercise per Session: 30 min  Stress: No Stress Concern Present (03/29/2024)   [employer] of Occupational Health - Occupational Stress Questionnaire    Feeling of Stress: Only a little  Social Connections: Socially Isolated (03/29/2024)   Social Connection and Isolation Panel    Frequency of Communication with  Friends and Family: More than three times a week    Frequency of Social Gatherings with Friends and Family: More than three times a week    Attends Religious Services: Never    Database administrator or Organizations: No    Attends Engineer, structural: Not on file    Marital Status: Divorced  Intimate Partner Violence: Not on file    Review of Systems: ROS negative except for what is noted on the assessment and plan.  Vitals:   03/30/24 0919 03/30/24 1002  BP: (!) 147/90 (!) 136/91  Pulse: 86 85  Temp: 98.1 F (36.7 C)   TempSrc: Oral   SpO2: 94%   Weight: 222 lb 9.6 oz (101 kg)   Height: 5' 6 (1.676 m)    Physical Exam: Constitutional: alert, sitting up in chair, in no acute distress Eyes: EOMI Neck: No carotid bruits  Cardiovascular: regular rate and rhythm Pulmonary/Chest: normal work of breathing on room air, lungs clear to auscultation bilaterally Neurological: alert & oriented x 3, CN II-XII intact, 5/5 strength in all extremities, sensation intact, no focal deficits Skin: warm and dry  Assessment & Plan:   Essential hypertension Status: near goal. BP today 136/91 on repeat. Reports taking HCTZ 12.5 mg daily. Has had normal BP in 120s at prior visits. Last BMP in 02/2023 was normal. May need to increase dose or consider 2nd agent.   Plan -Continue: hydrochlorothiazide   -BMP today   Prediabetes Presents due to concern for elevated glucose. Reports last Wednesday of feeling lightheaded. States had cup of coffee at work but no food or other drinks for that day. Denies recent illness. Endorses polydipsia and polyuria but no dysuria. No hx of DM but has famhx. A1c today of 5.8, which previously in normal range. Will counsel patient on lifestyle modifications.   Episodic lightheadedness Reports lightheadedness 4 days ago (Wednesday) at work. States this occurred at work. No recent changes or illness. No falls or LOC. Was told slowed speech but no trouble  expressing or finding words. No facial droop or unilateral weakness or numbness. Symptoms improved, ambulating without difficulty. States that day only had cup of coffee and no food or other drinks. Taking hydrochlorothiazide  daily. Checked blood sugar and was 150s and has been checking since ranging 150-170s, c/f diabetes given famhx. ABCD2 score low risk at this time. Exam unremarkable and vitals stable.   Plan -CBC, BMP, A1c  -Return precautions given    Patient discussed with Dr. Machen  Charonda Hefter, D.O. [facility], PGY-3 Phone: [phone] Date 03/30/2024 Time 5:08 PM

## 2024-03-30 NOTE — Patient Instructions (Addendum)
 Thank you, [name] for allowing us  to provide your care today. Today we discussed:  -Blood work today, I will call with results -Glad you are feeling better, continue to stay hydrated.    Follow up: 1-2 months   Should you have any questions or concerns please call the internal medicine clinic at [phone].    Mason Dibiasio, D.O. [facility]

## 2024-03-30 NOTE — Telephone Encounter (Signed)
 SDOH paperwork was give to the patient during her appointment today. Patient reused to complete paperwork.

## 2024-03-30 NOTE — Assessment & Plan Note (Addendum)
 Reports lightheadedness 4 days ago (Wednesday) at work. States this occurred at work. No recent changes or illness. No falls or LOC. Was told slowed speech but no trouble expressing or finding words. No facial droop or unilateral weakness or numbness. Symptoms improved, ambulating without difficulty. States that day only had cup of coffee and no food or other drinks. Taking hydrochlorothiazide  daily. Checked blood sugar and was 150s and has been checking since ranging 150-170s, c/f diabetes given famhx. ABCD2 score low risk at this time. Exam unremarkable and vitals stable.   Plan -CBC, BMP, A1c  -Return precautions given

## 2024-03-30 NOTE — Assessment & Plan Note (Signed)
 Presents due to concern for elevated glucose. Reports last Wednesday of feeling lightheaded. States had cup of coffee at work but no food or other drinks for that day. Denies recent illness. Endorses polydipsia and polyuria but no dysuria. No hx of DM but has famhx. A1c today of 5.8, which previously in normal range. Will counsel patient on lifestyle modifications.

## 2024-03-30 NOTE — Assessment & Plan Note (Addendum)
 Status: near goal. BP today 136/91 on repeat. Reports taking HCTZ 12.5 mg daily. Has had controlled BP in 120s at prior visits. Last BMP in 02/2023 was normal. May need to increase dose or consider 2nd agent.   Plan -Repeat BP at next visit, can do RN BP visit as well  -Continue: hydrochlorothiazide   -BMP today

## 2024-03-31 ENCOUNTER — Ambulatory Visit: Payer: Self-pay | Admitting: Student

## 2024-03-31 LAB — BASIC METABOLIC PANEL WITH GFR
BUN/Creatinine Ratio: 25 — ABNORMAL HIGH (ref 9–23)
BUN: 19 mg/dL (ref 6–24)
CO2: 21 mmol/L (ref 20–29)
Calcium: 10.1 mg/dL (ref 8.7–10.2)
Chloride: 103 mmol/L (ref 96–106)
Creatinine, Ser: 0.75 mg/dL (ref 0.57–1.00)
Glucose: 126 mg/dL — ABNORMAL HIGH (ref 70–99)
Potassium: 4.2 mmol/L (ref 3.5–5.2)
Sodium: 139 mmol/L (ref 134–144)
eGFR: 92 mL/min/1.73 (ref >59)

## 2024-03-31 LAB — CBC
Hematocrit: 39.5 % (ref 34.0–46.6)
Hemoglobin: 13.1 g/dL (ref 11.1–15.9)
MCH: 31.3 pg (ref 26.6–33.0)
MCHC: 33.2 g/dL (ref 31.5–35.7)
MCV: 95 fL (ref 79–97)
Platelets: 267 x10E3/uL (ref 150–450)
RBC: 4.18 x10E6/uL (ref 3.77–5.28)
RDW: 11.7 % (ref 11.7–15.4)
WBC: 5.9 x10E3/uL (ref 3.4–10.8)

## 2024-04-01 NOTE — Progress Notes (Signed)
 Internal Medicine Clinic Attending  Case discussed with the resident at the time of the visit.  We reviewed the resident's history and exam and pertinent patient test results.  I agree with the assessment, diagnosis, and plan of care documented in the resident's note.

## 2024-04-06 ENCOUNTER — Ambulatory Visit: Admitting: Student

## 2024-04-27 ENCOUNTER — Other Ambulatory Visit: Payer: Self-pay | Admitting: Student

## 2024-04-27 DIAGNOSIS — I10 Essential (primary) hypertension: Secondary | ICD-10-CM
